# Patient Record
Sex: Female | Born: 1970
Health system: Southern US, Community
[De-identification: ages and names within clinical notes are randomized; demographics above are authoritative.]

## PROBLEM LIST (undated history)

## (undated) DIAGNOSIS — M5430 Sciatica, unspecified side: Secondary | ICD-10-CM

## (undated) DIAGNOSIS — E669 Obesity, unspecified: Secondary | ICD-10-CM

## (undated) DIAGNOSIS — F419 Anxiety disorder, unspecified: Secondary | ICD-10-CM

## (undated) DIAGNOSIS — F329 Major depressive disorder, single episode, unspecified: Secondary | ICD-10-CM

## (undated) DIAGNOSIS — M1990 Unspecified osteoarthritis, unspecified site: Secondary | ICD-10-CM

## (undated) DIAGNOSIS — I10 Essential (primary) hypertension: Secondary | ICD-10-CM

## (undated) DIAGNOSIS — Z973 Presence of spectacles and contact lenses: Secondary | ICD-10-CM

## (undated) DIAGNOSIS — F32A Depression, unspecified: Secondary | ICD-10-CM

## (undated) DIAGNOSIS — E785 Hyperlipidemia, unspecified: Secondary | ICD-10-CM

## (undated) DIAGNOSIS — D649 Anemia, unspecified: Secondary | ICD-10-CM

## (undated) DIAGNOSIS — G4730 Sleep apnea, unspecified: Secondary | ICD-10-CM

## (undated) DIAGNOSIS — O139 Gestational [pregnancy-induced] hypertension without significant proteinuria, unspecified trimester: Secondary | ICD-10-CM

## (undated) HISTORY — DX: Obesity, unspecified: E66.9

## (undated) HISTORY — PX: KNEE ARTHROSCOPY: SUR90

## (undated) HISTORY — DX: Depression, unspecified: F32.A

## (undated) HISTORY — DX: Anemia, unspecified: D64.9

## (undated) HISTORY — DX: Unspecified osteoarthritis, unspecified site: M19.90

## (undated) HISTORY — DX: Major depressive disorder, single episode, unspecified: F32.9

## (undated) HISTORY — DX: Essential (primary) hypertension: I10

## (undated) HISTORY — DX: Hyperlipidemia, unspecified: E78.5

---

## 2001-04-11 ENCOUNTER — Encounter: Payer: Self-pay | Admitting: Obstetrics and Gynecology

## 2001-04-11 ENCOUNTER — Ambulatory Visit (HOSPITAL_COMMUNITY): Admission: RE | Admit: 2001-04-11 | Discharge: 2001-04-11 | Payer: Self-pay | Admitting: Obstetrics and Gynecology

## 2001-05-17 ENCOUNTER — Ambulatory Visit (HOSPITAL_COMMUNITY): Admission: AD | Admit: 2001-05-17 | Discharge: 2001-05-17 | Payer: Self-pay | Admitting: Obstetrics and Gynecology

## 2001-07-12 ENCOUNTER — Ambulatory Visit (HOSPITAL_COMMUNITY): Admission: AD | Admit: 2001-07-12 | Discharge: 2001-07-12 | Payer: Self-pay | Admitting: Obstetrics and Gynecology

## 2001-07-14 ENCOUNTER — Ambulatory Visit (HOSPITAL_COMMUNITY): Admission: AD | Admit: 2001-07-14 | Discharge: 2001-07-14 | Payer: Self-pay | Admitting: Obstetrics and Gynecology

## 2001-07-19 ENCOUNTER — Ambulatory Visit (HOSPITAL_COMMUNITY): Admission: RE | Admit: 2001-07-19 | Discharge: 2001-07-19 | Payer: Self-pay | Admitting: Obstetrics and Gynecology

## 2001-07-21 ENCOUNTER — Ambulatory Visit (HOSPITAL_COMMUNITY): Admission: AD | Admit: 2001-07-21 | Discharge: 2001-07-21 | Payer: Self-pay | Admitting: *Deleted

## 2001-07-26 ENCOUNTER — Ambulatory Visit (HOSPITAL_COMMUNITY): Admission: AD | Admit: 2001-07-26 | Discharge: 2001-07-26 | Payer: Self-pay | Admitting: Obstetrics and Gynecology

## 2001-07-28 ENCOUNTER — Ambulatory Visit (HOSPITAL_COMMUNITY): Admission: RE | Admit: 2001-07-28 | Discharge: 2001-07-28 | Payer: Self-pay | Admitting: Obstetrics and Gynecology

## 2001-08-02 ENCOUNTER — Ambulatory Visit (HOSPITAL_COMMUNITY): Admission: RE | Admit: 2001-08-02 | Discharge: 2001-08-02 | Payer: Self-pay | Admitting: Obstetrics and Gynecology

## 2001-08-04 ENCOUNTER — Ambulatory Visit (HOSPITAL_COMMUNITY): Admission: AD | Admit: 2001-08-04 | Discharge: 2001-08-04 | Payer: Self-pay | Admitting: Obstetrics and Gynecology

## 2001-08-09 ENCOUNTER — Ambulatory Visit (HOSPITAL_COMMUNITY): Admission: AD | Admit: 2001-08-09 | Discharge: 2001-08-09 | Payer: Self-pay | Admitting: Obstetrics and Gynecology

## 2001-08-11 ENCOUNTER — Ambulatory Visit (HOSPITAL_COMMUNITY): Admission: AD | Admit: 2001-08-11 | Discharge: 2001-08-11 | Payer: Self-pay | Admitting: Obstetrics and Gynecology

## 2001-08-15 ENCOUNTER — Ambulatory Visit (HOSPITAL_COMMUNITY): Admission: RE | Admit: 2001-08-15 | Discharge: 2001-08-15 | Payer: Self-pay | Admitting: *Deleted

## 2001-08-18 ENCOUNTER — Inpatient Hospital Stay (HOSPITAL_COMMUNITY): Admission: RE | Admit: 2001-08-18 | Discharge: 2001-08-21 | Payer: Self-pay | Admitting: Obstetrics and Gynecology

## 2001-09-22 ENCOUNTER — Ambulatory Visit (HOSPITAL_COMMUNITY): Admission: RE | Admit: 2001-09-22 | Discharge: 2001-09-22 | Payer: Self-pay | Admitting: Obstetrics and Gynecology

## 2003-01-14 ENCOUNTER — Emergency Department (HOSPITAL_COMMUNITY): Admission: EM | Admit: 2003-01-14 | Discharge: 2003-01-14 | Payer: Self-pay | Admitting: *Deleted

## 2003-03-16 ENCOUNTER — Ambulatory Visit (HOSPITAL_COMMUNITY): Admission: RE | Admit: 2003-03-16 | Discharge: 2003-03-16 | Payer: Self-pay | Admitting: Family Medicine

## 2003-03-16 ENCOUNTER — Encounter: Payer: Self-pay | Admitting: Family Medicine

## 2004-05-11 ENCOUNTER — Emergency Department (HOSPITAL_COMMUNITY): Admission: EM | Admit: 2004-05-11 | Discharge: 2004-05-11 | Payer: Self-pay | Admitting: Emergency Medicine

## 2004-11-20 ENCOUNTER — Emergency Department (HOSPITAL_COMMUNITY): Admission: EM | Admit: 2004-11-20 | Discharge: 2004-11-21 | Payer: Self-pay | Admitting: Emergency Medicine

## 2004-12-01 ENCOUNTER — Ambulatory Visit (HOSPITAL_COMMUNITY): Admission: RE | Admit: 2004-12-01 | Discharge: 2004-12-01 | Payer: Self-pay | Admitting: *Deleted

## 2005-08-10 ENCOUNTER — Ambulatory Visit (HOSPITAL_BASED_OUTPATIENT_CLINIC_OR_DEPARTMENT_OTHER): Admission: RE | Admit: 2005-08-10 | Discharge: 2005-08-10 | Payer: Self-pay | Admitting: Family Medicine

## 2005-08-16 ENCOUNTER — Ambulatory Visit: Payer: Self-pay | Admitting: Internal Medicine

## 2007-10-06 ENCOUNTER — Other Ambulatory Visit: Admission: RE | Admit: 2007-10-06 | Discharge: 2007-10-06 | Payer: Self-pay | Admitting: Family Medicine

## 2008-04-21 ENCOUNTER — Ambulatory Visit: Payer: Self-pay | Admitting: Family Medicine

## 2008-04-28 ENCOUNTER — Emergency Department: Payer: Self-pay | Admitting: Emergency Medicine

## 2008-11-01 ENCOUNTER — Ambulatory Visit: Payer: Self-pay | Admitting: Family Medicine

## 2010-05-23 ENCOUNTER — Emergency Department: Payer: Self-pay | Admitting: Internal Medicine

## 2010-07-16 ENCOUNTER — Emergency Department (HOSPITAL_COMMUNITY): Admission: EM | Admit: 2010-07-16 | Discharge: 2010-07-16 | Payer: Self-pay | Admitting: Emergency Medicine

## 2010-08-12 ENCOUNTER — Ambulatory Visit: Payer: Self-pay | Admitting: Family Medicine

## 2010-09-02 ENCOUNTER — Ambulatory Visit (HOSPITAL_COMMUNITY): Admission: RE | Admit: 2010-09-02 | Discharge: 2010-09-02 | Payer: Self-pay | Admitting: Family Medicine

## 2011-02-23 ENCOUNTER — Ambulatory Visit: Payer: Self-pay | Admitting: Family Medicine

## 2011-03-14 ENCOUNTER — Ambulatory Visit: Payer: Self-pay | Admitting: Orthopedic Surgery

## 2011-03-14 LAB — POCT CARDIAC MARKERS
CKMB, poc: <1 ng/mL — ABNORMAL LOW (ref 1.0–8.0)
Myoglobin, poc: 34.6 ng/mL (ref 12–200)
Troponin i, poc: <0.05 ng/mL (ref 0.00–0.09)

## 2011-03-14 LAB — CBC
HCT: 39.3 % (ref 36.0–46.0)
Hemoglobin: 13.3 g/dL (ref 12.0–15.0)
MCH: 29.1 pg (ref 26.0–34.0)
MCHC: 33.8 g/dL (ref 30.0–36.0)
MCV: 86.1 fL (ref 78.0–100.0)
Platelets: 267 10*3/uL (ref 150–400)
RBC: 4.57 MIL/uL (ref 3.87–5.11)
RDW: 13.3 % (ref 11.5–15.5)
WBC: 8.7 10*3/uL (ref 4.0–10.5)

## 2011-03-14 LAB — DIFFERENTIAL
Basophils Absolute: 0 10*3/uL (ref 0.0–0.1)
Basophils Relative: 1 % (ref 0–1)
Eosinophils Absolute: 0.1 10*3/uL (ref 0.0–0.7)
Eosinophils Relative: 2 % (ref 0–5)
Lymphocytes Relative: 26 % (ref 12–46)
Lymphs Abs: 2.3 10*3/uL (ref 0.7–4.0)
Monocytes Absolute: 0.7 10*3/uL (ref 0.1–1.0)
Monocytes Relative: 8 % (ref 3–12)
Neutro Abs: 5.6 10*3/uL (ref 1.7–7.7)
Neutrophils Relative %: 64 % (ref 43–77)

## 2011-03-14 LAB — BASIC METABOLIC PANEL
BUN: 5 mg/dL — ABNORMAL LOW (ref 6–23)
CO2: 27 mEq/L (ref 19–32)
Calcium: 9.8 mg/dL (ref 8.4–10.5)
Chloride: 100 mEq/L (ref 96–112)
Creatinine, Ser: 0.51 mg/dL (ref 0.4–1.2)
GFR calc Af Amer: >60 mL/min (ref >60)
GFR calc non Af Amer: >60 mL/min (ref >60)
Glucose, Bld: 260 mg/dL — ABNORMAL HIGH (ref 70–99)
Potassium: 4.1 mEq/L (ref 3.5–5.1)
Sodium: 133 mEq/L — ABNORMAL LOW (ref 135–145)

## 2011-03-14 LAB — D-DIMER, QUANTITATIVE: D-Dimer, Quant: 0.22 ug/mL-FEU (ref 0.00–0.48)

## 2011-03-23 ENCOUNTER — Ambulatory Visit: Payer: Self-pay | Admitting: Anesthesiology

## 2011-03-26 ENCOUNTER — Ambulatory Visit: Payer: Self-pay | Admitting: Orthopedic Surgery

## 2011-04-22 ENCOUNTER — Ambulatory Visit (HOSPITAL_COMMUNITY)
Admission: RE | Admit: 2011-04-22 | Discharge: 2011-04-22 | Disposition: A | Discharge status: Home / Self Care | Payer: PRIVATE HEALTH INSURANCE | Source: Ambulatory Visit | Attending: Orthopedic Surgery | Admitting: Orthopedic Surgery

## 2011-04-22 DIAGNOSIS — IMO0001 Reserved for inherently not codable concepts without codable children: Secondary | ICD-10-CM | POA: Insufficient documentation

## 2011-04-22 DIAGNOSIS — M25569 Pain in unspecified knee: Secondary | ICD-10-CM | POA: Insufficient documentation

## 2011-04-22 DIAGNOSIS — M6281 Muscle weakness (generalized): Secondary | ICD-10-CM | POA: Insufficient documentation

## 2011-04-22 DIAGNOSIS — E119 Type 2 diabetes mellitus without complications: Secondary | ICD-10-CM | POA: Insufficient documentation

## 2011-04-22 DIAGNOSIS — M25669 Stiffness of unspecified knee, not elsewhere classified: Secondary | ICD-10-CM | POA: Insufficient documentation

## 2011-04-22 DIAGNOSIS — R262 Difficulty in walking, not elsewhere classified: Secondary | ICD-10-CM | POA: Insufficient documentation

## 2011-04-30 ENCOUNTER — Ambulatory Visit (HOSPITAL_COMMUNITY): Payer: PRIVATE HEALTH INSURANCE | Admitting: Physical Therapy

## 2011-05-01 ENCOUNTER — Ambulatory Visit (HOSPITAL_COMMUNITY): Payer: PRIVATE HEALTH INSURANCE

## 2011-05-15 NOTE — Procedures (Signed)
[name], Sabrina Roberts               ACCOUNT NO.:  [id number]   MEDICAL RECORD NO.:  [id number]          PATIENT TYPE:  OUT   LOCATION:  SLEEP CENTER                 FACILITY:  [facility]   PHYSICIAN:  Clinton D. Maple Hudson, M.D. DATE OF BIRTH:  [birth year]   DATE OF STUDY:  08/10/2005                              NOCTURNAL POLYSOMNOGRAM   REFERRING PHYSICIAN:  Dr. Renaye Rakers   DATE OF STUDY:  August 10, 2005   INDICATION FOR STUDY:  Insomnia with sleep apnea. Epworth Sleepiness Score  5/24, BMI 53, weight 311 pounds.   SLEEP ARCHITECTURE:  Total sleep time 294 minutes with sleep efficiency 67%.  Stage I was 8%, stage II 60%, stages III and IV 1%, REM 31% of total sleep  time. Sleep latency 71 minutes, REM latency 96 minutes, awake after sleep  onset 72 minutes, arousal index increased at 46. No bedtime medication  taken.   RESPIRATORY DATA:  Split study protocol. Apnea hypopnea index (RDI, AHI) 69  obstructive events per hour indicating severe obstructive sleep  apnea/hypopnea syndrome before CPAP. This included 20 obstructive apneas and  129 hypopneas before CPAP. Events were not positional. REM RDI of 25. CPAP  was titrated successfully to 10 CWP, RDI 0 per hour, using a medium  Respironics ComfortLite 2 nasal mask with heated humidifier.   OXYGEN DATA:  Moderate snoring with oxygen desaturation to a nadir of 79%  before CPAP. After CPAP control saturation held 94-98% on room air.   CARDIAC DATA:  Normal sinus rhythm.   MOVEMENT/PARASOMNIA:  A total of 176 limb jerks were counted of which 18  were associated with arousal or awakening for a periodic limb movement with  arousal index of 3.7 per hour which is mildly increased but of uncertain  significance in the context of sleep apnea arousals.   IMPRESSION/RECOMMENDATION:  1.  Severe obstructive sleep apnea/hypopnea syndrome, Respiratory      Disturbance Index 69 per hour with moderate snoring and oxygen      desaturation to 79%.  2.   Successful continuous positive airway pressure titration to 10 CWP,      apnea/hypopnea index 0 per hour using a medium Respironics ComfortLite 2      nasal mask with heated humidifier.      Clinton D. Maple Hudson, M.D.  Diplomate, Biomedical engineer of Sleep Medicine  Electronically Signed     CDY/MEDQ  D:  08/16/2005 12:39:37  T:  08/16/2005 19:27:36  Job:  [id number]

## 2011-06-12 ENCOUNTER — Ambulatory Visit: Payer: Self-pay | Admitting: Bariatrics

## 2011-06-15 ENCOUNTER — Ambulatory Visit: Payer: Self-pay | Admitting: Bariatrics

## 2011-06-19 ENCOUNTER — Ambulatory Visit: Payer: Self-pay | Admitting: Bariatrics

## 2011-06-28 ENCOUNTER — Ambulatory Visit: Payer: Self-pay | Admitting: Bariatrics

## 2011-07-29 ENCOUNTER — Ambulatory Visit: Payer: Self-pay | Admitting: Bariatrics

## 2011-08-07 ENCOUNTER — Telehealth: Payer: Self-pay | Admitting: Cardiovascular Disease

## 2011-08-07 NOTE — Telephone Encounter (Addendum)
LMOM for Pt to call Back, [employer] papers ready for Pick-Up.  08/07/11/KM  Pt Never called back, Called again this am Spoke with Husband Casimiro Needle let him know paperwork from [employer] forWife was completed and ready for pick-up, he ok'd and said he will let Wife know .   08/10/11/KM

## 2011-08-25 ENCOUNTER — Emergency Department (HOSPITAL_COMMUNITY): Payer: PRIVATE HEALTH INSURANCE

## 2011-08-25 ENCOUNTER — Encounter: Payer: Self-pay | Admitting: Emergency Medicine

## 2011-08-25 DIAGNOSIS — IMO0002 Reserved for concepts with insufficient information to code with codable children: Secondary | ICD-10-CM | POA: Insufficient documentation

## 2011-08-25 DIAGNOSIS — E119 Type 2 diabetes mellitus without complications: Secondary | ICD-10-CM | POA: Insufficient documentation

## 2011-08-25 NOTE — ED Notes (Signed)
Patient states family member broke glass on floor and states she feels like glass is in her right foot.

## 2011-08-26 ENCOUNTER — Emergency Department (HOSPITAL_COMMUNITY)
Admission: EM | Admit: 2011-08-26 | Discharge: 2011-08-26 | Disposition: A | Discharge status: Home / Self Care | Payer: PRIVATE HEALTH INSURANCE | Attending: Emergency Medicine | Admitting: Emergency Medicine

## 2011-08-26 DIAGNOSIS — IMO0002 Reserved for concepts with insufficient information to code with codable children: Secondary | ICD-10-CM

## 2011-08-26 MED ORDER — CEPHALEXIN 500 MG PO CAPS
ORAL_CAPSULE | ORAL | Status: AC
  Filled 2011-08-26: qty 1

## 2011-08-26 MED ORDER — LIDOCAINE-EPINEPHRINE (PF) 1 %-1:200000 IJ SOLN
INTRAMUSCULAR | Status: AC
  Filled 2011-08-26: qty 10

## 2011-08-26 MED ORDER — CEPHALEXIN 500 MG PO CAPS: 500.0000 mg | ORAL_CAPSULE | Freq: Four times a day (QID) | ORAL | Status: AC

## 2011-08-26 MED ORDER — CEPHALEXIN 500 MG PO CAPS
500.0000 mg | ORAL_CAPSULE | Freq: Once | ORAL | Status: AC
  Administered 2011-08-26: 500 mg via ORAL

## 2011-08-26 MED ORDER — TRAMADOL HCL 50 MG PO TABS: 50.0000 mg | ORAL_TABLET | Freq: Four times a day (QID) | ORAL | Status: AC | PRN

## 2011-08-26 NOTE — ED Provider Notes (Signed)
History     CSN: [id number] Arrival date & time: 08/26/2011 12:12 AM  Chief Complaint  Patient presents with  . Foreign Body   HPI Comments: Pt states her daughter dropped and broke a light bulb this AM.  She stepped on a sliver and it has bothered her all day.  Patient is a 40 y.o. female presenting with foreign body. The history is provided by the patient and the spouse. No language interpreter was used.  Foreign Body  The current episode started 6 to 12 hours ago. The incident was witnessed/reported by the patient. There were no sick contacts. She has received no recent medical care.    Past Medical History  Diagnosis Date  . Diabetes mellitus     Past Surgical History  Procedure Date  . Orthopedic surgery     No family history on file.  History  Substance Use Topics  . Smoking status: Never Smoker   . Smokeless tobacco: Not on file  . Alcohol Use: Yes    OB History    Grav Para Term Preterm Abortions TAB SAB Ect Mult Living                  Review of Systems  All other systems reviewed and are negative.    Physical Exam  BP 180/94  Pulse 104  Temp(Src) 98.2 F (36.8 C) (Oral)  Resp 18  Ht 5\' 4"  (1.626 m)  Wt 309 lb (140.161 kg)  BMI 53.04 kg/m2  SpO2 99%  LMP 05/25/2011  Physical Exam  Nursing note and vitals reviewed. Constitutional: She is oriented to person, place, and time. Vital signs are normal. She appears well-developed and well-nourished. No distress.  HENT:  Head: Normocephalic and atraumatic.  Right Ear: External ear normal.  Left Ear: External ear normal.  Nose: Nose normal.  Mouth/Throat: No oropharyngeal exudate.  Eyes: Conjunctivae and EOM are normal. Pupils are equal, round, and reactive to light. Right eye exhibits no discharge. Left eye exhibits no discharge. No scleral icterus.  Neck: Normal range of motion. Neck supple. No JVD present. No tracheal deviation present. No thyromegaly present.  Cardiovascular: Normal rate,  regular rhythm, normal heart sounds, intact distal pulses and normal pulses.  Exam reveals no gallop and no friction rub.   No murmur heard. Pulmonary/Chest: Effort normal and breath sounds normal. No stridor. No respiratory distress. She has no wheezes. She has no rales. She exhibits no tenderness.  Abdominal: Soft. Normal appearance and bowel sounds are normal. She exhibits no distension and no mass. There is no tenderness. There is no rebound and no guarding.  Musculoskeletal: Normal range of motion. She exhibits no edema and no tenderness.  Lymphadenopathy:    She has no cervical adenopathy.  Neurological: She is alert and oriented to person, place, and time. She has normal reflexes. No cranial nerve deficit. Coordination normal. GCS eye subscore is 4. GCS verbal subscore is 5. GCS motor subscore is 6.  Reflex Scores:      Tricep reflexes are 2+ on the right side and 2+ on the left side.      Bicep reflexes are 2+ on the right side and 2+ on the left side.      Brachioradialis reflexes are 2+ on the right side and 2+ on the left side.      Patellar reflexes are 2+ on the right side and 2+ on the left side.      Achilles reflexes are 2+ on the right side and  2+ on the left side. Skin: Skin is warm and dry. No rash noted. She is not diaphoretic.  Psychiatric: She has a normal mood and affect. Her speech is normal and behavior is normal. Judgment and thought content normal. Cognition and memory are normal.    ED Course  FOREIGN BODY REMOVAL Date/Time: 08/26/2011 1:00 AM Performed by: Worthy Rancher Authorized by: Worthy Rancher Consent: Verbal consent obtained. Written consent not obtained. Risks and benefits: risks, benefits and alternatives were discussed Consent given by: patient Patient understanding: patient states understanding of the procedure being performed Patient consent: the patient's understanding of the procedure matches consent given Imaging studies: imaging studies  available Patient identity confirmed: verbally with patient Time out: Immediately prior to procedure a "time out" was called to verify the correct patient, procedure, equipment, support staff and site/side marked as required. Body area: skin General location: lower extremity Location details: right foot Anesthesia: local infiltration Local anesthetic: lidocaine 1% with epinephrine Patient sedated: no Patient restrained: no Patient cooperative: yes Localization method: probed Tendon involvement: none Depth: subcutaneous Post-procedure assessment: foreign body not removed Patient tolerance: Patient tolerated the procedure well with no immediate complications.    MDM       Worthy Rancher, PA 08/26/11 0122  Worthy Rancher, PA 08/26/11 [phone]

## 2011-09-07 ENCOUNTER — Ambulatory Visit: Payer: Self-pay | Admitting: Bariatrics

## 2011-09-28 ENCOUNTER — Ambulatory Visit: Payer: Self-pay | Admitting: Bariatrics

## 2011-10-04 NOTE — ED Provider Notes (Signed)
Medical screening examination/treatment/procedure(s) were performed by non-physician practitioner and as supervising physician I was immediately available for consultation/collaboration.  Nicoletta Dress. Colon Branch, MD 10/04/11 2108

## 2011-10-22 ENCOUNTER — Ambulatory Visit: Payer: Self-pay | Admitting: Gastroenterology

## 2011-10-26 LAB — PATHOLOGY REPORT

## 2011-10-29 ENCOUNTER — Ambulatory Visit: Payer: Self-pay | Admitting: Bariatrics

## 2011-11-28 HISTORY — PX: GASTRIC BYPASS: SHX52

## 2011-11-28 HISTORY — PX: CHOLECYSTECTOMY: SHX55

## 2011-12-04 ENCOUNTER — Ambulatory Visit: Payer: Self-pay | Admitting: Bariatrics

## 2011-12-10 ENCOUNTER — Inpatient Hospital Stay: Payer: Self-pay | Admitting: Bariatrics

## 2011-12-14 LAB — PATHOLOGY REPORT

## 2012-02-26 ENCOUNTER — Encounter: Payer: Self-pay | Admitting: Family Medicine

## 2012-02-26 ENCOUNTER — Ambulatory Visit (INDEPENDENT_AMBULATORY_CARE_PROVIDER_SITE_OTHER): Payer: PRIVATE HEALTH INSURANCE | Admitting: Family Medicine

## 2012-02-26 VITALS — BP 142/82 | HR 76 | Resp 18 | Ht 64.5 in | Wt 264.0 lb

## 2012-02-26 DIAGNOSIS — E669 Obesity, unspecified: Secondary | ICD-10-CM

## 2012-02-26 DIAGNOSIS — E559 Vitamin D deficiency, unspecified: Secondary | ICD-10-CM

## 2012-02-26 DIAGNOSIS — E119 Type 2 diabetes mellitus without complications: Secondary | ICD-10-CM

## 2012-02-26 DIAGNOSIS — E041 Nontoxic single thyroid nodule: Secondary | ICD-10-CM

## 2012-02-26 DIAGNOSIS — E785 Hyperlipidemia, unspecified: Secondary | ICD-10-CM

## 2012-02-26 NOTE — Patient Instructions (Signed)
Please schedule a physical within the next 2-3 weeks Please set up for a Mammogram Get the blood work done fasting

## 2012-02-28 ENCOUNTER — Encounter: Payer: Self-pay | Admitting: Family Medicine

## 2012-02-28 DIAGNOSIS — E119 Type 2 diabetes mellitus without complications: Secondary | ICD-10-CM | POA: Insufficient documentation

## 2012-02-28 DIAGNOSIS — E041 Nontoxic single thyroid nodule: Secondary | ICD-10-CM | POA: Insufficient documentation

## 2012-02-28 DIAGNOSIS — E669 Obesity, unspecified: Secondary | ICD-10-CM | POA: Insufficient documentation

## 2012-02-28 DIAGNOSIS — E785 Hyperlipidemia, unspecified: Secondary | ICD-10-CM | POA: Insufficient documentation

## 2012-02-28 NOTE — Assessment & Plan Note (Signed)
·   Obtain FLP

## 2012-02-28 NOTE — Assessment & Plan Note (Signed)
Obtain A1C then determine need for medications

## 2012-02-28 NOTE — Progress Notes (Signed)
  Subjective:    Patient ID: Sabrina Roberts, female    DOB: January 05, 1971, 41 y.o.   MRN: [id number]  HPI Pt here to establish care, previous PCP in [place], [place] Works at [employer] in [employer], production Bypass- has lost 78 pounds since starting her weight loss journey. Followed by Dr. Alva Garnet.Currently on vitamins. Still unable to tolerate many foods without nausea.  DM- taken off all medications after her bypass, needs A1C and BMET  Hyperlipidemia- due for FLP  OA- in bilateral knees , left hip  Thyroid problem- has been followed for thyroid nodules, do for labwork, will obtain records regarding this.   Due for Mammogram, CPE, has had TDAP and Flu  Review of Systems  GEN- denies fatigue, fever, weight loss,weakness, recent illness HEENT- denies eye drainage, change in vision, nasal discharge, CVS- denies chest pain, palpitations RESP- denies SOB, cough, wheeze ABD- + N/V, change in stools, abd pain GU- denies dysuria, hematuria, dribbling, incontinence MSK- +joint pain, muscle aches, injury Neuro- denies headache, dizziness, syncope, seizure activity       Objective:   Physical Exam GEN- NAD, alert and oriented x3, obese, very pleasant HEENT- PERRL, EOMI, non injected sclera, pink conjunctiva, MMM, oropharynx clear Neck- Supple, no thryomegaly CVS- RRR, no murmur RESP-CTAB ABD-NABS, soft, NT, ND EXT- No edema Pulses- Radial, DP- 2+        Assessment & Plan:

## 2012-02-28 NOTE — Assessment & Plan Note (Signed)
Obtain records, check thyroid studies

## 2012-02-28 NOTE — Assessment & Plan Note (Signed)
S/p gastric bypass, doing very well. Will obtain records

## 2012-04-19 ENCOUNTER — Telehealth: Payer: Self-pay | Admitting: Family Medicine

## 2012-04-20 ENCOUNTER — Ambulatory Visit: Payer: Self-pay

## 2012-04-21 ENCOUNTER — Telehealth: Payer: Self-pay | Admitting: Family Medicine

## 2012-04-21 ENCOUNTER — Ambulatory Visit: Payer: Self-pay

## 2012-04-21 LAB — LIPID PANEL
Cholesterol: 144 mg/dL (ref 0–200)
Cholesterol: 144 mg/dL (ref 0–200)
HDL Cholesterol: 33 mg/dL — ABNORMAL LOW (ref 40–60)
HDL: 33 mg/dL — AB (ref 35–70)
LDL Cholesterol: 91 mg/dL
Ldl Cholesterol, Calc: 91 mg/dL (ref 0–100)
Triglycerides: 99 mg/dL (ref 0–200)
Triglycerides: 99 mg/dL (ref 40–160)
VLDL Cholesterol, Calc: 20 mg/dL (ref 5–40)

## 2012-04-21 LAB — COMPREHENSIVE METABOLIC PANEL
Albumin: 3.6 g/dL (ref 3.4–5.0)
Alkaline Phosphatase: 136 U/L (ref 50–136)
Anion Gap: 8 (ref 7–16)
BUN: 5 mg/dL — ABNORMAL LOW (ref 7–18)
Bilirubin,Total: 0.7 mg/dL (ref 0.2–1.0)
Calcium, Total: 8.9 mg/dL (ref 8.5–10.1)
Chloride: 102 mmol/L (ref 98–107)
Co2: 30 mmol/L (ref 21–32)
Creatinine: 0.58 mg/dL — ABNORMAL LOW (ref 0.60–1.30)
EGFR (African American): >60
EGFR (Non-African Amer.): >60
Glucose: 114 mg/dL — ABNORMAL HIGH (ref 65–99)
Osmolality: 278 (ref 275–301)
Potassium: 3 mmol/L — ABNORMAL LOW (ref 3.5–5.1)
SGOT(AST): 37 U/L (ref 15–37)
SGPT (ALT): 44 U/L
Sodium: 140 mmol/L (ref 136–145)
Total Protein: 7.5 g/dL (ref 6.4–8.2)

## 2012-04-21 LAB — HM MAMMOGRAPHY: HM Mammogram: NORMAL

## 2012-04-21 LAB — HEMOGLOBIN A1C
Hemoglobin A1C: 6.2 % (ref 4.2–6.3)
Hgb A1c MFr Bld: 6.2 % — AB (ref 4.0–6.0)

## 2012-04-21 LAB — BASIC METABOLIC PANEL
BUN: 5 mg/dL (ref 4–21)
Creatinine: 0.6 mg/dL (ref 0.5–1.1)
Glucose: 114 mg/dL
Potassium: 3 mmol/L — AB (ref 3.4–5.3)
Sodium: 140 mmol/L (ref 137–147)

## 2012-04-21 LAB — CBC WITH DIFFERENTIAL/PLATELET
Basophil #: 0.1 10*3/uL (ref 0.0–0.1)
Basophil %: 0.7 %
Eosinophil #: 0.2 10*3/uL (ref 0.0–0.7)
Eosinophil %: 2.5 %
HCT: 40.4 % (ref 35.0–47.0)
HGB: 13.5 g/dL (ref 12.0–16.0)
Lymphocyte #: 3.3 10*3/uL (ref 1.0–3.6)
Lymphocyte %: 44.3 %
MCH: 28.5 pg (ref 26.0–34.0)
MCHC: 33.5 g/dL (ref 32.0–36.0)
MCV: 85 fL (ref 80–100)
Monocyte #: 0.7 x10 3/mm (ref 0.2–0.9)
Monocyte %: 9.2 %
Neutrophil #: 3.2 10*3/uL (ref 1.4–6.5)
Neutrophil %: 43.3 %
Platelet: 246 10*3/uL (ref 150–440)
RBC: 4.74 10*6/uL (ref 3.80–5.20)
RDW: 13.8 % (ref 11.5–14.5)
WBC: 7.5 10*3/uL (ref 3.6–11.0)

## 2012-04-21 LAB — TSH
TSH: 0.43 u[IU]/mL (ref 0.41–5.90)
Thyroid Stimulating Horm: 0.427 u[IU]/mL — ABNORMAL LOW

## 2012-04-21 LAB — T4, FREE: Free Thyroxine: 1.08 ng/dL (ref 0.76–1.46)

## 2012-04-21 NOTE — Telephone Encounter (Signed)
Already faxed.

## 2012-04-21 NOTE — Telephone Encounter (Signed)
Will fax when signed

## 2012-04-25 ENCOUNTER — Ambulatory Visit: Payer: PRIVATE HEALTH INSURANCE | Admitting: Family Medicine

## 2012-04-25 ENCOUNTER — Encounter: Payer: Self-pay | Admitting: Family Medicine

## 2012-05-03 ENCOUNTER — Other Ambulatory Visit (HOSPITAL_COMMUNITY)
Admission: RE | Admit: 2012-05-03 | Discharge: 2012-05-03 | Disposition: A | Discharge status: Home / Self Care | Payer: PRIVATE HEALTH INSURANCE | Source: Ambulatory Visit | Attending: Family Medicine | Admitting: Family Medicine

## 2012-05-03 ENCOUNTER — Ambulatory Visit (INDEPENDENT_AMBULATORY_CARE_PROVIDER_SITE_OTHER): Payer: PRIVATE HEALTH INSURANCE | Admitting: Family Medicine

## 2012-05-03 ENCOUNTER — Encounter: Payer: Self-pay | Admitting: Family Medicine

## 2012-05-03 VITALS — BP 142/86 | HR 84 | Resp 18 | Ht 64.5 in | Wt 241.1 lb

## 2012-05-03 DIAGNOSIS — Z01419 Encounter for gynecological examination (general) (routine) without abnormal findings: Secondary | ICD-10-CM

## 2012-05-03 DIAGNOSIS — Z124 Encounter for screening for malignant neoplasm of cervix: Secondary | ICD-10-CM

## 2012-05-03 DIAGNOSIS — R21 Rash and other nonspecific skin eruption: Secondary | ICD-10-CM

## 2012-05-03 DIAGNOSIS — E119 Type 2 diabetes mellitus without complications: Secondary | ICD-10-CM

## 2012-05-03 DIAGNOSIS — M25519 Pain in unspecified shoulder: Secondary | ICD-10-CM

## 2012-05-03 DIAGNOSIS — Z129 Encounter for screening for malignant neoplasm, site unspecified: Secondary | ICD-10-CM

## 2012-05-03 DIAGNOSIS — E669 Obesity, unspecified: Secondary | ICD-10-CM

## 2012-05-03 DIAGNOSIS — E041 Nontoxic single thyroid nodule: Secondary | ICD-10-CM

## 2012-05-03 MED ORDER — MELOXICAM 7.5 MG PO TABS: 7.5000 mg | ORAL_TABLET | Freq: Every day | ORAL | Status: DC

## 2012-05-03 MED ORDER — CLOTRIMAZOLE-BETAMETHASONE 1-0.05 % EX CREA: TOPICAL_CREAM | CUTANEOUS | Status: DC

## 2012-05-03 NOTE — Assessment & Plan Note (Signed)
I need to review labs then will decide if meds needed

## 2012-05-03 NOTE — Assessment & Plan Note (Signed)
Pap Smear done Mammogram done  i will obtain records Labs to be reviewed Immunizations UTD

## 2012-05-03 NOTE — Progress Notes (Addendum)
  Subjective:    Patient ID: Sabrina Roberts, female    DOB: Sep 19, 1971, 41 y.o.   MRN: [id number]  HPI Patient here for GYN exam. She also had her mammogram done at [facility]. She's had blood work done however I do not have this in front of me will send off for this. Her only concern today is left shoulder pain which has been occurring on and off for the past 2 months. She remembers reaching up trying to fix her daughter's bunkbed when she felt a pull. She uses ibuprofen which helps some. Her pain is mostly at nighttime although she does not lay on that side. She also notices that she gets a rash beneath her breasts.  Cycles are irregular, LMP 2 months ago, they have been irregular and heavy lasting up to 9 days for the past 2 years   Review of Systems  GEN- denies fatigue, fever, weight loss,weakness, recent illness HEENT- denies eye drainage, change in vision, nasal discharge, CVS- denies chest pain, palpitations RESP- denies SOB, cough, wheeze ABD- denies N/V, change in stools, abd pain GU- denies dysuria, hematuria, dribbling, incontinence MSK- + joint pain, muscle aches, injury Neuro- denies headache, dizziness, syncope, seizure activity       Objective:   Physical Exam GEN- NAD, alert and oriented x3 HEENT- PERRL, EOMI, non injected sclera, pink conjunctiva, MMM, oropharynx clear Neck- Supple, no thyromegaly, ?small nodule Left upper pole CVS- RRR, no murmur RESP-CTAB Breast- normal symmetry, no nipple inversion,no nipple drainage, no nodules or lumps felt,skin- hyperpigemented macular rash beneath right breast, mild erythema Nodes- no axillary nodes GU- normal external genitalia, vaginal mucosa pink and moist, cervix visualized no growth, no blood form os, no discharge, no CMT, no ovarian masses, uterus normal size EXT- No edema Pulses- Radial, DP- 2+ MSK- Rotator cuff in tact bilat, no winged scapula, left shoulder TTP near [facility] joint, biceps intact, neg impingement  signs       Assessment & Plan:

## 2012-05-03 NOTE — Patient Instructions (Addendum)
We will get your Mammogram results I will call with your labs Get the ultrasound done for your thyroid For your shoulder taking the anti-inflammatory once a day for the next 4 weeks Cream for beneath the breast to be used twice a day as needed F/U 4 months

## 2012-05-03 NOTE — Assessment & Plan Note (Signed)
Treat for muscle strain possible tendinitis- Meloxicam x 4 weeks if no improvement refer to ortho

## 2012-05-03 NOTE — Assessment & Plan Note (Signed)
Given handwritten script for thyroid ultrasound to be done at [facility].

## 2012-05-03 NOTE — Assessment & Plan Note (Signed)
Weight down another 19lbs status post gastric bypass, she is doing well

## 2012-05-04 ENCOUNTER — Ambulatory Visit: Payer: Self-pay

## 2012-05-04 DIAGNOSIS — R21 Rash and other nonspecific skin eruption: Secondary | ICD-10-CM | POA: Insufficient documentation

## 2012-05-04 NOTE — Assessment & Plan Note (Signed)
likely secondary to moisture and yeast, will treat with course of lotrisone, she has tried some hydrocortisone for the same which was present beneath the left breast and this helped some

## 2012-05-13 ENCOUNTER — Telehealth: Payer: Self-pay | Admitting: Family Medicine

## 2012-05-13 NOTE — Telephone Encounter (Signed)
Called pt again LVM

## 2012-05-13 NOTE — Telephone Encounter (Addendum)
LVM for pt to return call A1C is 6.2%, she can stay off medications and we will recheck at next visit Her thyroid studies are overall normal, Her TSH was slightly low, so I will look at this in setting of her thyroid study and recheck her thyroid studies with her repeat labs  Her potassium was low at 3.0, I want her to increase her potassium intake, she can try half a banana, green leafy veggies, avocado, beans etc. Potassium script given for once a day supplement Repeat labs in 3 months

## 2012-05-16 NOTE — Telephone Encounter (Signed)
Patient aware. RX faxed

## 2012-05-16 NOTE — Telephone Encounter (Signed)
rx faxed.  Pt aware 

## 2012-07-05 ENCOUNTER — Telehealth: Payer: Self-pay | Admitting: Family Medicine

## 2012-07-06 NOTE — Telephone Encounter (Signed)
Please call and see what this is in reference to?  

## 2012-07-07 ENCOUNTER — Other Ambulatory Visit: Payer: Self-pay

## 2012-07-07 ENCOUNTER — Telehealth: Payer: Self-pay | Admitting: Family Medicine

## 2012-07-07 MED ORDER — ZOLPIDEM TARTRATE 10 MG PO TABS: 10.0000 mg | ORAL_TABLET | Freq: Every evening | ORAL | Status: DC | PRN

## 2012-07-07 NOTE — Telephone Encounter (Signed)
Wants to know if you can prescribe something to help her sleep.  [facility]

## 2012-07-07 NOTE — Telephone Encounter (Signed)
Trial of ambien 

## 2012-10-13 ENCOUNTER — Encounter: Payer: Self-pay | Admitting: Family Medicine

## 2012-10-13 ENCOUNTER — Ambulatory Visit (INDEPENDENT_AMBULATORY_CARE_PROVIDER_SITE_OTHER): Payer: PRIVATE HEALTH INSURANCE | Admitting: Family Medicine

## 2012-10-13 VITALS — BP 120/90 | HR 66 | Wt 214.1 lb

## 2012-10-13 DIAGNOSIS — E669 Obesity, unspecified: Secondary | ICD-10-CM

## 2012-10-13 DIAGNOSIS — F430 Acute stress reaction: Secondary | ICD-10-CM

## 2012-10-13 DIAGNOSIS — G4700 Insomnia, unspecified: Secondary | ICD-10-CM

## 2012-10-13 DIAGNOSIS — E119 Type 2 diabetes mellitus without complications: Secondary | ICD-10-CM

## 2012-10-13 DIAGNOSIS — E785 Hyperlipidemia, unspecified: Secondary | ICD-10-CM

## 2012-10-13 DIAGNOSIS — E041 Nontoxic single thyroid nodule: Secondary | ICD-10-CM

## 2012-10-13 MED ORDER — ZOLPIDEM TARTRATE 10 MG PO TABS: 10.0000 mg | ORAL_TABLET | Freq: Every evening | ORAL | Status: DC | PRN

## 2012-10-13 NOTE — Progress Notes (Signed)
  Subjective:    Patient ID: Sabrina Roberts, female    DOB: [birth year], 41 y.o.   MRN: [id number]  HPI Pt here to f/u chronic medical problems. She's been off her diabetes medicines for the past 5 months. She's been doing well. Her blood sugars today around the 100. She is status post gastric bypass surgery. She's lost over 100 pounds. She has had a lot of stress in her life with the change in job and also caring for a sickly mother-in-law. She is in a counselor through her work and feels this is going well. She was started on Ambien about 2 months ago to help with sleep which is been working for other she does not need to take this every night. Initially she thought she may need an antidepressant at this time would like to hold off as she is may progress with her therapist.   Review of Systems  GEN- denies fatigue, fever, weight loss,weakness, recent illness HEENT- denies eye drainage, change in vision, nasal discharge, CVS- denies chest pain, palpitations RESP- denies SOB, cough, wheeze ABD- denies N/V, change in stools, abd pain GU- denies dysuria, hematuria, dribbling, incontinence MSK- denies joint pain, muscle aches, injury Neuro- denies headache, dizziness, syncope, seizure activity      Objective:   Physical Exam GEN- NAD, alert and oriented x3 HEENT- PERRL, EOMI, non injected sclera, pink conjunctiva, MMM, oropharynx clear Neck- Supple, no thryomegaly CVS- RRR, no murmur RESP-CTAB EXT- No edema Pulses- Radial, DP- 2+ Psych-normal affect and Mood. No SI       Assessment & Plan:

## 2012-10-13 NOTE — Patient Instructions (Addendum)
Get blood work done fasting Continue your vitamins Call if meds needed for mood F/U 6 months

## 2012-10-14 ENCOUNTER — Ambulatory Visit: Payer: Self-pay

## 2012-10-14 ENCOUNTER — Encounter: Payer: Self-pay | Admitting: Family Medicine

## 2012-10-14 DIAGNOSIS — F430 Acute stress reaction: Secondary | ICD-10-CM | POA: Insufficient documentation

## 2012-10-14 DIAGNOSIS — G4700 Insomnia, unspecified: Secondary | ICD-10-CM | POA: Insufficient documentation

## 2012-10-14 LAB — COMPREHENSIVE METABOLIC PANEL
Albumin: 3.7 g/dL (ref 3.4–5.0)
Alkaline Phosphatase: 129 U/L (ref 50–136)
Anion Gap: 4 — ABNORMAL LOW (ref 7–16)
BUN: 9 mg/dL (ref 7–18)
Bilirubin,Total: 0.7 mg/dL (ref 0.2–1.0)
Calcium, Total: 9 mg/dL (ref 8.5–10.1)
Chloride: 105 mmol/L (ref 98–107)
Co2: 30 mmol/L (ref 21–32)
Creatinine: 0.51 mg/dL — ABNORMAL LOW (ref 0.60–1.30)
EGFR (African American): >60
EGFR (Non-African Amer.): >60
Glucose: 92 mg/dL (ref 65–99)
Osmolality: 276 (ref 275–301)
Potassium: 3.7 mmol/L (ref 3.5–5.1)
SGOT(AST): 19 U/L (ref 15–37)
SGPT (ALT): 22 U/L (ref 12–78)
Sodium: 139 mmol/L (ref 136–145)
Total Protein: 7.5 g/dL (ref 6.4–8.2)

## 2012-10-14 LAB — CBC WITH DIFFERENTIAL/PLATELET
Basophil #: 0 10*3/uL (ref 0.0–0.1)
Basophil %: 0.9 %
Eosinophil #: 0.2 10*3/uL (ref 0.0–0.7)
Eosinophil %: 3.6 %
HCT: 39 % (ref 35.0–47.0)
HGB: 13.2 g/dL (ref 12.0–16.0)
Lymphocyte #: 2.4 10*3/uL (ref 1.0–3.6)
Lymphocyte %: 44.7 %
MCH: 29.8 pg (ref 26.0–34.0)
MCHC: 33.8 g/dL (ref 32.0–36.0)
MCV: 88 fL (ref 80–100)
Monocyte #: 0.4 x10 3/mm (ref 0.2–0.9)
Monocyte %: 7.2 %
Neutrophil #: 2.4 10*3/uL (ref 1.4–6.5)
Neutrophil %: 43.6 %
Platelet: 246 10*3/uL (ref 150–440)
RBC: 4.42 10*6/uL (ref 3.80–5.20)
RDW: 13.7 % (ref 11.5–14.5)
WBC: 5.4 10*3/uL (ref 3.6–11.0)

## 2012-10-14 LAB — HEMOGLOBIN A1C: Hemoglobin A1C: 5.9 % (ref 4.2–6.3)

## 2012-10-14 LAB — LIPID PANEL
Cholesterol: 163 mg/dL (ref 0–200)
HDL Cholesterol: 54 mg/dL (ref 40–60)
Ldl Cholesterol, Calc: 94 mg/dL (ref 0–100)
Triglycerides: 77 mg/dL (ref 0–200)
VLDL Cholesterol, Calc: 15 mg/dL (ref 5–40)

## 2012-10-14 LAB — TSH: Thyroid Stimulating Horm: 0.457 u[IU]/mL

## 2012-10-14 NOTE — Assessment & Plan Note (Signed)
Check A1C off medications, previous 6.2%, with weight loss, she may not need further medications, she can check CBG q weekly for now or if feeling sick New meter called in by nurse

## 2012-10-14 NOTE — Assessment & Plan Note (Signed)
Check TFT,non specific tiny nodules in 2011

## 2012-10-14 NOTE — Assessment & Plan Note (Signed)
No meds at this time, if this change will start SSRI, continue with therapist

## 2012-10-14 NOTE — Assessment & Plan Note (Signed)
LDL at goal 

## 2012-10-14 NOTE — Assessment & Plan Note (Signed)
Initial weight 344lbs prior to surgery, down 130lbs

## 2012-10-14 NOTE — Assessment & Plan Note (Signed)
Continue prn ambien  

## 2012-10-17 ENCOUNTER — Telehealth: Payer: Self-pay | Admitting: Family Medicine

## 2012-10-17 NOTE — Telephone Encounter (Signed)
Stamped and faxed order to number provided

## 2012-10-28 ENCOUNTER — Telehealth: Payer: Self-pay | Admitting: Family Medicine

## 2012-10-28 NOTE — Telephone Encounter (Signed)
Have not received labs. Did not do at [facility]

## 2012-11-01 NOTE — Telephone Encounter (Signed)
Have you received any labwork that was faxed over for this pt

## 2012-11-01 NOTE — Telephone Encounter (Signed)
I dont have any results, see if lab can fax again

## 2012-11-21 ENCOUNTER — Ambulatory Visit: Payer: Self-pay

## 2012-11-21 LAB — RAPID STREP-A WITH REFLX: Micro Text Report: NEGATIVE

## 2012-11-23 LAB — BETA STREP CULTURE(ARMC)

## 2013-01-20 ENCOUNTER — Telehealth: Payer: Self-pay | Admitting: Family Medicine

## 2013-01-20 MED ORDER — ZOLPIDEM TARTRATE 10 MG PO TABS: 10.0000 mg | ORAL_TABLET | Freq: Every evening | ORAL | Status: DC | PRN

## 2013-01-20 NOTE — Telephone Encounter (Signed)
Med sent.

## 2013-04-13 ENCOUNTER — Ambulatory Visit: Payer: PRIVATE HEALTH INSURANCE | Admitting: Family Medicine

## 2013-04-30 ENCOUNTER — Other Ambulatory Visit: Payer: Self-pay | Admitting: Family Medicine

## 2013-05-08 ENCOUNTER — Other Ambulatory Visit: Payer: Self-pay | Admitting: Family Medicine

## 2013-05-15 ENCOUNTER — Telehealth: Payer: Self-pay | Admitting: Family Medicine

## 2013-05-15 ENCOUNTER — Other Ambulatory Visit: Payer: Self-pay

## 2013-05-15 MED ORDER — ZOLPIDEM TARTRATE 10 MG PO TABS: ORAL_TABLET | ORAL | Status: DC

## 2013-05-15 NOTE — Telephone Encounter (Signed)
Med refilled and to be faxed to pharmacy this am.

## 2013-08-01 ENCOUNTER — Ambulatory Visit: Payer: Self-pay

## 2013-08-01 ENCOUNTER — Encounter: Payer: Self-pay | Admitting: Family Medicine

## 2013-08-01 ENCOUNTER — Ambulatory Visit (INDEPENDENT_AMBULATORY_CARE_PROVIDER_SITE_OTHER): Payer: 59 | Admitting: Family Medicine

## 2013-08-01 VITALS — BP 130/80 | HR 82 | Temp 97.5°F | Resp 18 | Wt 210.0 lb

## 2013-08-01 DIAGNOSIS — E785 Hyperlipidemia, unspecified: Secondary | ICD-10-CM

## 2013-08-01 DIAGNOSIS — G4700 Insomnia, unspecified: Secondary | ICD-10-CM

## 2013-08-01 DIAGNOSIS — N926 Irregular menstruation, unspecified: Secondary | ICD-10-CM

## 2013-08-01 DIAGNOSIS — E119 Type 2 diabetes mellitus without complications: Secondary | ICD-10-CM

## 2013-08-01 DIAGNOSIS — M25579 Pain in unspecified ankle and joints of unspecified foot: Secondary | ICD-10-CM | POA: Insufficient documentation

## 2013-08-01 MED ORDER — ZOLPIDEM TARTRATE 10 MG PO TABS: ORAL_TABLET | ORAL | Status: DC

## 2013-08-01 NOTE — Assessment & Plan Note (Addendum)
Diet controlled we'll check A1c Urine micro needed next visit Given hand written prescription for her labs including CMET, CBC, A1c, fasting lipid panel

## 2013-08-01 NOTE — Progress Notes (Signed)
  Subjective:    Patient ID: Sabrina Roberts, female    DOB: 11-25-1971, 42 y.o.   MRN: [id number]  HPI Patient to follow chronic medical problems. She's concerned about irregular menses. She has a long-term history of irregular cycles however this year his only had 2 periods. Both of them have been heavier and lasted about 7-8 days. She thinks she is about to start currently. Her last Pap smear was May of last year which was normal. She's also noticed some hot flashes where her hair feels drenched as well as some chills on and off. She's not had any mood swings. She believes her mother started menopause around her 51s.  Bilateral foot pain over the past few months she has had pain on both sides near her pinky toe. She feels a bony part sticking out. It's painful when she walks. She denies any particular injury. She's been wearing wide shoes as she knows she is flat-footed.  Due for fasting lab  Insomnia-doing well on a me and he does not use on the weekends or if she gets home late typically takes a few times a week as needed   Review of Systems   GEN- denies fatigue, fever, weight loss,weakness, recent illness HEENT- denies eye drainage, change in vision, nasal discharge, CVS- denies chest pain, palpitations RESP- denies SOB, cough, wheeze ABD- denies N/V, change in stools, abd pain GU- denies dysuria, hematuria, dribbling, incontinence MSK- + joint pain, muscle aches, injury Neuro- denies headache, dizziness, syncope, seizure activity      Objective:   Physical Exam GEN- NAD, alert and oriented x3, obese HEENT- PERRL, EOMI, non injected sclera, pink conjunctiva, MMM, oropharynx clear CVS- RRR, no murmur RESP-CTAB ABD-NABS,soft,NT,ND EXT- No edema, Foot- TTp along lateral aspect of 5th digit, small bony protuberance bilaterally, no erythema , mild callus formation bilaterally same spot, flat footed, with widened foot  Pulses- Radial, DP- 2+        Assessment & Plan:

## 2013-08-01 NOTE — Assessment & Plan Note (Signed)
She has some bilateral foot pain along the lateral aspect of her foot there bony protuberances on both sides which I suspect may be bone spurs. She also is very flat-footed and has lost her metatarsal arches. I will obtain x-ray of bilateral foot and we will go from there. She may need orthotics or podiatry referral

## 2013-08-01 NOTE — Assessment & Plan Note (Signed)
Check fasting lipid panel no medications

## 2013-08-01 NOTE — Patient Instructions (Addendum)
We will call with lab results  Get the xray done of the foot  Schedule GYN exam

## 2013-08-01 NOTE — Assessment & Plan Note (Signed)
Well-controlled with use of Ambien as needed

## 2013-08-01 NOTE — Assessment & Plan Note (Signed)
I will obtain hormone studies. LH, FSH, TSH she will return for Pap smear

## 2013-08-23 ENCOUNTER — Other Ambulatory Visit: Payer: 59 | Admitting: Family Medicine

## 2013-11-02 ENCOUNTER — Other Ambulatory Visit: Payer: Self-pay

## 2014-01-15 ENCOUNTER — Other Ambulatory Visit: Payer: Self-pay | Admitting: Family Medicine

## 2014-01-15 ENCOUNTER — Ambulatory Visit: Payer: Self-pay

## 2014-01-15 DIAGNOSIS — Z0000 Encounter for general adult medical examination without abnormal findings: Secondary | ICD-10-CM

## 2014-01-15 LAB — CBC WITH DIFFERENTIAL/PLATELET
Basophil #: 0 10*3/uL (ref 0.0–0.1)
Basophil %: 0.7 %
Eosinophil #: 0.3 10*3/uL (ref 0.0–0.7)
Eosinophil %: 5.4 %
HCT: 40.1 % (ref 35.0–47.0)
HGB: 13.3 g/dL (ref 12.0–16.0)
Lymphocyte #: 1.7 10*3/uL (ref 1.0–3.6)
Lymphocyte %: 32.3 %
MCH: 29.5 pg (ref 26.0–34.0)
MCHC: 33.3 g/dL (ref 32.0–36.0)
MCV: 89 fL (ref 80–100)
Monocyte #: 0.4 x10 3/mm (ref 0.2–0.9)
Monocyte %: 8.2 %
Neutrophil #: 2.9 10*3/uL (ref 1.4–6.5)
Neutrophil %: 53.4 %
Platelet: 278 10*3/uL (ref 150–440)
RBC: 4.52 10*6/uL (ref 3.80–5.20)
RDW: 13.6 % (ref 11.5–14.5)
WBC: 5.4 10*3/uL (ref 3.6–11.0)

## 2014-01-15 LAB — LIPID PANEL
Cholesterol: 187 mg/dL (ref 0–200)
HDL Cholesterol: 77 mg/dL — ABNORMAL HIGH (ref 40–60)
Ldl Cholesterol, Calc: 91 mg/dL (ref 0–100)
Triglycerides: 93 mg/dL (ref 0–200)
VLDL Cholesterol, Calc: 19 mg/dL (ref 5–40)

## 2014-01-15 LAB — COMPREHENSIVE METABOLIC PANEL
Albumin: 3.8 g/dL (ref 3.4–5.0)
Alkaline Phosphatase: 133 U/L — ABNORMAL HIGH
Anion Gap: 11 (ref 7–16)
BUN: 8 mg/dL (ref 7–18)
Bilirubin,Total: 0.6 mg/dL (ref 0.2–1.0)
Calcium, Total: 9 mg/dL (ref 8.5–10.1)
Chloride: 102 mmol/L (ref 98–107)
Co2: 27 mmol/L (ref 21–32)
Creatinine: 0.54 mg/dL — ABNORMAL LOW (ref 0.60–1.30)
EGFR (African American): >60
EGFR (Non-African Amer.): >60
Glucose: 89 mg/dL (ref 65–99)
Osmolality: 277 (ref 275–301)
Potassium: 3.9 mmol/L (ref 3.5–5.1)
SGOT(AST): 14 U/L — ABNORMAL LOW (ref 15–37)
SGPT (ALT): 24 U/L (ref 12–78)
Sodium: 140 mmol/L (ref 136–145)
Total Protein: 7.6 g/dL (ref 6.4–8.2)

## 2014-01-15 LAB — TSH: Thyroid Stimulating Horm: 0.339 u[IU]/mL — ABNORMAL LOW

## 2014-01-16 ENCOUNTER — Encounter: Payer: 59 | Admitting: Family Medicine

## 2014-01-23 ENCOUNTER — Telehealth: Payer: Self-pay | Admitting: Family Medicine

## 2014-01-23 NOTE — Telephone Encounter (Signed)
Okay to refill, needs OV  

## 2014-01-23 NOTE — Telephone Encounter (Signed)
?   Ok to refill; last ov 08/01/2013; last refill 10/01/13

## 2014-01-23 NOTE — Telephone Encounter (Signed)
Pt is needing a refill on her Ambien Call back number is [phone] Pharmacy is [facility] in [place]

## 2014-01-24 ENCOUNTER — Encounter: Payer: 59 | Admitting: Family Medicine

## 2014-01-24 MED ORDER — ZOLPIDEM TARTRATE 10 MG PO TABS: ORAL_TABLET | ORAL | Status: DC

## 2014-01-24 NOTE — Telephone Encounter (Signed)
Med called in to pts pharmacy, pt is aware needs follow up appt before further refills

## 2014-04-05 ENCOUNTER — Other Ambulatory Visit: Payer: Self-pay

## 2014-07-04 ENCOUNTER — Telehealth: Payer: Self-pay | Admitting: *Deleted

## 2014-07-04 ENCOUNTER — Other Ambulatory Visit: Payer: Self-pay | Admitting: *Deleted

## 2014-07-04 DIAGNOSIS — E119 Type 2 diabetes mellitus without complications: Secondary | ICD-10-CM

## 2014-07-04 NOTE — Telephone Encounter (Signed)
Message copied by Sheral Flow on Wed Jul 04, 2014  9:24 AM ------      Message from: Lenore Manner      Created: Tue Jul 03, 2014  3:05 PM      Regarding: one of the pts thats on my list      Contact: [phone]       PT states she can get the labs done at her job, she has a CPE coming up and she does need LDL an A1c             She said you can fax the orders to [phone] ------

## 2014-07-04 NOTE — Telephone Encounter (Signed)
Orders for CPE faxed to patient work.

## 2014-07-25 ENCOUNTER — Encounter: Payer: 59 | Admitting: Family Medicine

## 2014-07-31 ENCOUNTER — Encounter: Payer: 59 | Admitting: Family Medicine

## 2014-08-09 ENCOUNTER — Encounter: Payer: 59 | Admitting: Family Medicine

## 2014-09-24 ENCOUNTER — Encounter: Payer: 59 | Admitting: Family Medicine

## 2014-10-02 ENCOUNTER — Encounter: Payer: 59 | Admitting: Family Medicine

## 2014-10-25 ENCOUNTER — Ambulatory Visit: Payer: Self-pay | Admitting: Family Medicine

## 2014-10-25 ENCOUNTER — Telehealth: Payer: Self-pay | Admitting: Family Medicine

## 2014-10-25 LAB — TSH
TSH: 0.48 u[IU]/mL (ref 0.41–5.90)
Thyroid Stimulating Horm: 0.488 u[IU]/mL

## 2014-10-25 LAB — COMPREHENSIVE METABOLIC PANEL
Albumin: 3.5 g/dL (ref 3.4–5.0)
Alkaline Phosphatase: 130 U/L — ABNORMAL HIGH
Anion Gap: 8 (ref 7–16)
BUN: 7 mg/dL (ref 7–18)
Bilirubin,Total: 0.6 mg/dL (ref 0.2–1.0)
Calcium, Total: 8.7 mg/dL (ref 8.5–10.1)
Chloride: 106 mmol/L (ref 98–107)
Co2: 28 mmol/L (ref 21–32)
Creatinine: 0.55 mg/dL — ABNORMAL LOW (ref 0.60–1.30)
EGFR (African American): >60
EGFR (Non-African Amer.): >60
Glucose: 96 mg/dL (ref 65–99)
Osmolality: 281 (ref 275–301)
Potassium: 3.9 mmol/L (ref 3.5–5.1)
SGOT(AST): 19 U/L (ref 15–37)
SGPT (ALT): 20 U/L
Sodium: 142 mmol/L (ref 136–145)
Total Protein: 7.2 g/dL (ref 6.4–8.2)

## 2014-10-25 LAB — LIPID PANEL
Cholesterol: 172 mg/dL (ref 0–200)
Cholesterol: 172 mg/dL (ref 0–200)
HDL Cholesterol: 56 mg/dL (ref 40–60)
HDL: 56 mg/dL (ref 35–70)
LDL Cholesterol: 100 mg/dL
Ldl Cholesterol, Calc: 100 mg/dL (ref 0–100)
Triglycerides: 82 mg/dL (ref 0–200)
Triglycerides: 82 mg/dL (ref 40–160)
VLDL Cholesterol, Calc: 16 mg/dL (ref 5–40)

## 2014-10-25 LAB — CBC WITH DIFFERENTIAL/PLATELET
Basophil #: 0.1 10*3/uL (ref 0.0–0.1)
Basophil %: 0.8 %
Eosinophil #: 0.2 10*3/uL (ref 0.0–0.7)
Eosinophil %: 2.6 %
HCT: 38.7 % (ref 35.0–47.0)
HGB: 12.8 g/dL (ref 12.0–16.0)
Lymphocyte #: 2.9 10*3/uL (ref 1.0–3.6)
Lymphocyte %: 43.4 %
MCH: 30.1 pg (ref 26.0–34.0)
MCHC: 33.2 g/dL (ref 32.0–36.0)
MCV: 91 fL (ref 80–100)
Monocyte #: 0.7 x10 3/mm (ref 0.2–0.9)
Monocyte %: 10.6 %
Neutrophil #: 2.8 10*3/uL (ref 1.4–6.5)
Neutrophil %: 42.6 %
Platelet: 270 10*3/uL (ref 150–440)
RBC: 4.27 10*6/uL (ref 3.80–5.20)
RDW: 13.3 % (ref 11.5–14.5)
WBC: 6.6 10*3/uL (ref 3.6–11.0)

## 2014-10-25 LAB — HEPATIC FUNCTION PANEL
ALT: 19 U/L (ref 7–35)
AST: 20 U/L (ref 13–35)

## 2014-10-25 LAB — BASIC METABOLIC PANEL
BUN: 7 mg/dL (ref 4–21)
Creatinine: 0.6 mg/dL (ref 0.5–1.1)
Glucose: 96 mg/dL

## 2014-10-25 LAB — HEMOGLOBIN A1C
Hemoglobin A1C: 6 % (ref 4.2–6.3)
Hgb A1c MFr Bld: 6 % (ref 4.0–6.0)

## 2014-10-25 LAB — CBC AND DIFFERENTIAL
HCT: 39 % (ref 36–46)
Hemoglobin: 12.8 g/dL (ref 12.0–16.0)
WBC: 6.6 10^3/mL

## 2014-10-25 NOTE — Telephone Encounter (Signed)
Orders for labs were faxed 06/2014.  Call placed to patient to inquire.   Reports that orders were not signed by MD. Durward Fortes that the orders sent were lab requisitions and have electronic signature.   Requested orders to be re-faxed.   Sent lab orders again.

## 2014-10-25 NOTE — Telephone Encounter (Signed)
[phone]  Pt has an upcoming apt and is needing orders for her labs sent to [phone] attn: Sherilyn Dacosta

## 2014-10-29 ENCOUNTER — Ambulatory Visit (INDEPENDENT_AMBULATORY_CARE_PROVIDER_SITE_OTHER): Payer: 59 | Admitting: Family Medicine

## 2014-10-29 ENCOUNTER — Encounter: Payer: Self-pay | Admitting: Family Medicine

## 2014-10-29 VITALS — BP 130/76 | HR 82 | Temp 99.2°F | Resp 14 | Ht 61.0 in | Wt 243.0 lb

## 2014-10-29 DIAGNOSIS — Z124 Encounter for screening for malignant neoplasm of cervix: Secondary | ICD-10-CM

## 2014-10-29 DIAGNOSIS — Z23 Encounter for immunization: Secondary | ICD-10-CM

## 2014-10-29 DIAGNOSIS — Z0000 Encounter for general adult medical examination without abnormal findings: Secondary | ICD-10-CM

## 2014-10-29 DIAGNOSIS — R946 Abnormal results of thyroid function studies: Secondary | ICD-10-CM

## 2014-10-29 DIAGNOSIS — E119 Type 2 diabetes mellitus without complications: Secondary | ICD-10-CM

## 2014-10-29 MED ORDER — VITAMIN D (ERGOCALCIFEROL) 1.25 MG (50000 UNIT) PO CAPS: 50000.0000 [IU] | ORAL_CAPSULE | ORAL | Status: DC

## 2014-10-29 MED ORDER — ZOLPIDEM TARTRATE 10 MG PO TABS: ORAL_TABLET | ORAL | Status: DC

## 2014-10-29 NOTE — Assessment & Plan Note (Signed)
Diet controlled Plan for urine microalbumin

## 2014-10-29 NOTE — Patient Instructions (Addendum)
I recommend eye visit once a year I recommend dental visit every 6 months Goal is to  Exercise 30 minutes 5 days a week Get the thyroid labs done Schedule your mammogram Vitamin D for next 6 months Pneumonia vaccine 23 given F/U 6 months

## 2014-10-29 NOTE — Progress Notes (Signed)
Patient ID: Sabrina Roberts, female   DOB: [birth year], 43 y.o.   MRN: [id number]   Subjective:    Patient ID: Sabrina Roberts, female    DOB: 1971-01-05, 43 y.o.   MRN: [id number]  Patient presents for CPE  No specific concerns, last PAP Smear in 2013, was normal. Due for mammogram History of abnormal PAP in the past. LMP started today, previous cycle 4 months ago, has irregular menses Fasting labs reviewed- Vitamin D very low at 6  Diabetes mellitus diet controlled since gastric bypass, A1C 6%, due for pneumonia vaccine Obesity- has gained some weight back, not watching diet, started with a traniner as part of a wellness program with her work    Review Of Systems:  GEN- denies fatigue, fever, weight loss,weakness, recent illness HEENT- denies eye drainage, change in vision, nasal discharge, CVS- denies chest pain, palpitations RESP- denies SOB, cough, wheeze ABD- denies N/V, change in stools, abd pain GU- denies dysuria, hematuria, dribbling, incontinence MSK- denies joint pain, muscle aches, injury Neuro- denies headache, dizziness, syncope, seizure activity       Objective:    BP 130/76 mmHg  Pulse 82  Temp(Src) 99.2 F (37.3 C) (Oral)  Resp 14  Ht 5\' 1"  (1.549 m)  Wt 243 lb (110.224 kg)  BMI 45.94 kg/m2  LMP 10/28/2014 (Exact Date) GEN- NAD, alert and oriented x3,  +33lb weight gain HEENT- PERRL, EOMI, non injected sclera, pink conjunctiva, MMM, oropharynx clear Neck- Supple, no thyromegaly CVS- RRR, no murmur Breast- normal symmetry, no nipple inversion,no nipple drainage, no nodules or lumps felt Nodes- no axillary nodes RESP-CTAB ABD-NABS,soft,NT,ND GU- normal external genitalia, vaginal mucosa pink and moist, cervix visualized no growth, no blood form os, No discharge, no CMT, no ovarian masses, uterus normal size EXT- No edema Pulses- Radial, DP- 2+        Assessment & Plan:      Problem List Items Addressed This Visit    None    Visit  Diagnoses    Cervical cancer screening    -  Primary    Relevant Orders       PAP, ThinPrep ASCUS Rflx HPV Rflx Type       Note: This dictation was prepared with Dragon dictation along with smaller phrase technology. Any transcriptional errors that result from this process are unintentional.

## 2014-10-29 NOTE — Assessment & Plan Note (Signed)
Add Free T3, T4 to labs, borderline TSH

## 2014-10-30 ENCOUNTER — Encounter: Payer: Self-pay | Admitting: *Deleted

## 2014-10-30 LAB — PAP THINPREP ASCUS RFLX HPV RFLX TYPE

## 2015-01-23 ENCOUNTER — Encounter: Payer: Self-pay | Admitting: Family Medicine

## 2015-01-23 ENCOUNTER — Ambulatory Visit (INDEPENDENT_AMBULATORY_CARE_PROVIDER_SITE_OTHER): Payer: 59 | Admitting: Family Medicine

## 2015-01-23 VITALS — BP 140/80 | HR 78 | Temp 98.7°F | Resp 14 | Ht 61.0 in | Wt 241.0 lb

## 2015-01-23 DIAGNOSIS — G5602 Carpal tunnel syndrome, left upper limb: Secondary | ICD-10-CM

## 2015-01-23 DIAGNOSIS — G5603 Carpal tunnel syndrome, bilateral upper limbs: Secondary | ICD-10-CM

## 2015-01-23 DIAGNOSIS — G5601 Carpal tunnel syndrome, right upper limb: Secondary | ICD-10-CM

## 2015-01-23 MED ORDER — GABAPENTIN 300 MG PO CAPS: 300.0000 mg | ORAL_CAPSULE | Freq: Every day | ORAL | Status: DC

## 2015-01-23 NOTE — Progress Notes (Signed)
Patient ID: Sabrina Roberts, female   DOB: Dec 18, 1971, 44 y.o.   MRN: [id number]   Subjective:    Patient ID: Sabrina Roberts, female    DOB: 1971-03-21, 44 y.o.   MRN: [id number]  Patient presents for Hand Numbness  patient here with bilateral hand numbness and tingling which has been worsening over the past year. She works mostly Education officer, museum work she has noticed when she is typing that her hands go numb when she is trying to do her hair or her children's hair her hands go numb as well she also gets pins and needles in the fingertips mostly in the thumb and index finger region. Occasionally she feels a sharp pain towards her right wrist. She's not tried any medications to help with the pain. She's tried doing ergonomic things with her keyboard at work but she continues to have a lot of discomfort. She even wakes up in the middle of the night with symptoms    Review Of Systems: per above   GEN- denies fatigue, fever, weight loss,weakness, recent illness HEENT- denies eye drainage, change in vision, nasal discharge, CVS- denies chest pain, palpitations RESP- denies SOB, cough, wheeze ABD- denies N/V, change in stools, abd pain GU- denies dysuria, hematuria, dribbling, incontinence MSK- denies joint pain, muscle aches, injury Neuro- denies headache, dizziness, syncope, seizure activity       Objective:    BP 140/80 mmHg  Pulse 78  Temp(Src) 98.7 F (37.1 C) (Oral)  Resp 14  Ht 5\' 1"  (1.549 m)  Wt 241 lb (109.317 kg)  BMI 45.56 kg/m2  LMP 11/16/2014 GEN- NAD, alert and oriented x3 Neck- FROM MSK- bilat hands, normal inspection, normal graps, FROM wrist, fingers Neuro- +phalens, +tinels bilat, tone normal EXT, strength equal bilat        Assessment & Plan:      Problem List Items Addressed This Visit    None    Visit Diagnoses    Bilateral carpal tunnel syndrome    -  Primary    Trial of wrist braces and gabapentin, proceed with nerve conduction in 4 weeks  if not improved    Relevant Medications    gabapentin (NEURONTIN) capsule       Note: This dictation was prepared with Dragon dictation along with smaller phrase technology. Any transcriptional errors that result from this process are unintentional.

## 2015-01-23 NOTE — Patient Instructions (Signed)
Get the wrist brace Try the gabapentin at night around 8pm F/U as needed

## 2015-04-16 ENCOUNTER — Other Ambulatory Visit: Payer: Self-pay | Admitting: Family Medicine

## 2015-04-16 NOTE — Telephone Encounter (Signed)
Ok to refill??  Last office visit 01/23/2015.  Last refill 10/29/2014, #3 refills.

## 2015-04-16 NOTE — Telephone Encounter (Signed)
OKAY TO REFILL?

## 2015-04-17 NOTE — Telephone Encounter (Signed)
Medication called to pharmacy. 

## 2015-04-21 NOTE — Consult Note (Signed)
PATIENT NAME:  Sabrina Roberts, Sabrina Roberts MR#:  [id number] DATE OF BIRTH:  [birth year]  DATE OF CONSULTATION:  12/10/2011  REFERRING PHYSICIAN:  Arletta Bale, MD CONSULTING PHYSICIAN:  Torrance Stockley H. Posey Pronto, MD  REASON FOR CONSULTATION: Medical opinion regarding the patient's sleep apnea, diabetes, GERD, and restless leg syndrome.   HISTORY OF PRESENT ILLNESS: The patient is a 44 year old African American female who is morbidly obese with a BMI greater than 50 who underwent a laparoscopic gastric bypass as well as laparoscopic cholecystectomy. The patient tolerated the procedure without any complications. She currently is doing well and complains of some epigastric discomfort. She denies any shortness of breath, no chest pains, no fevers, and no nausea or vomiting.   PAST MEDICAL HISTORY:  1. Sleep apnea.  2. Morbid obesity.  3. Diabetes type 2.  4. Gastroesophageal reflux disease.  5. Restless leg syndrome.  6. Chronic lower extremity edema.  7. Thyroid nodules.   PAST SURGICAL HISTORY:  1. Status post knee arthroscopy. 2. Tubular ligation. 3. Gastric bypass and cholecystectomy as above.   ALLERGIES: Actos, Band-Aids, and latex.   CURRENT MEDICATIONS:  1. Aleve 220 mg 2 tabs twice a day as needed for pain. 2. Byetta 10 mcg twice a day, after breakfast and supper. 3. Lasix 20 mg daily as needed.  4. Glipizide 10 mg daily.  5. Metformin 1000 mg p.o. twice a day. 6. Multivitamin 1 tab p.o. daily.  7. Omeprazole 20 mg daily.  8. Pramipexole 0.125 mg 1 to 2 tabs at bedtime as needed.  9. Tramadol 50 mg every 4 hours p.r.n.  10. Vitamin D 1 tab p.o. daily.   SOCIAL HISTORY: She does not smoke or drink; no drugs.   FAMILY HISTORY: Positive for hypertension and diabetes.  REVIEW OF SYSTEMS: CONSTITUTIONAL: Denies any fevers. No fatigue. No weakness. Has chronic back pain. No weight loss. EYES: No blurred or double vision. No pain. No redness. No inflammation. No glaucoma. No cataracts. ENT: No  tinnitus. No ear pain or hearing loss. No seasonal or year-round allergies. No epistaxis. No nasal discharge. Does have snoring. No postnasal drip. No sinus pain. No dentures. No redness of oropharynx. No difficulty swallowing. RESPIRATORY: No cough. No wheezing. No hemoptysis. No dyspnea. No asthma. No painful respirations. No tuberculosis. No pneumonia. Has sleep apnea and uses CPAP at bedtime. CARDIOVASCULAR: No chest pain. No orthopnea. Does have edema in the lower extremities. No arrhythmia. No palpitations. No syncope. No history of hypertension. No varicose veins. GI: No nausea, vomiting, or diarrhea. Abdominal discomfort currently. No hematemesis. No melena. No ulcers. Does have GERD. No jaundice. No rectal bleeding. No melena. GU: Denies any dysuria, hematuria, renal calculus, frequency, or incontinence. ENDOCRINE: Denies any polyuria, nocturia, or thyroid problems. No increase in sweating, heat or cold intolerance, or thirst. HEME/LYMPH: Denies any anemia, easy bruisability, or bleeding. SKIN: No acne. No rash. No changes in mole, hair, or skin. MUSCULOSKELETAL: Has chronic back pain and has pain in the knees due to arthritis. NEURO: No numbness. No weakness. No CVA. No transient ischemic attack. PSYCHIATRIC: No anxiety. No insomnia. No ADD.   PHYSICAL EXAMINATION:   VITAL SIGNS: Temperature 97.4, pulse 76, respirations 18, blood pressure 100/63, and oxygen saturation 94% on 2 liters.   GENERAL: The patient is an obese [place] American female, not in any acute distress.   HEENT: Head atraumatic, normocephalic. Pupils are equally round and reactive to light and accommodation. Extraocular movements are intact. Oropharynx is clear without any exudates. Nasal exam shows  no drainage or ulceration. Ear exam shows no erythema or drainage.   NECK: No thyromegaly. No carotid bruits.   HEART: Regular rate and rhythm. No murmurs, rubs, clicks, or gallops. PMI is not displaced.   LUNGS: Clear to  auscultation bilaterally without any rales, rhonchi, or wheezing.    ABDOMEN: Soft, obese, and nondistended. No hepatosplenomegaly is appreciated. No guarding. No rebound.   EXTREMITIES: No clubbing, cyanosis, or edema.   NEUROLOGICAL: Awake, alert, and oriented x3. Cranial nerves II through XII grossly intact.   SKIN: No rash.   LYMPHATICS: No lymph nodes palpable.   VASCULAR: Good DP and PT pulses.   PSYCHIATRIC: The patient is not anxious or depressed. Mood appropriate.  LABS: Earlier this morning WBC was 9.4, hemoglobin 11.3, and platelet count 232. BMP: Glucose 149, creatinine 0.74, sodium 143, potassium 3.4, calcium 7.7, magnesium 1.2, and serum phosphorous 3.9   ASSESSMENT AND PLAN: The patient is a 44 year old African American female with history of diabetes, sleep apnea, morbid obesity, gastroesophageal reflux disease, restless leg syndrome, and status post left gastric bypass and laparoscopic cholecystectomy. The patient tolerated the procedures well without any complications.  1. Diabetes: On Byetta, glipizide and metformin. On clear liquids today. We will hold these medications. Her blood sugar has been 137. We will hold her oral medications. Her sugars have started going up a little bit, so I will start her on Byetta 5 mcg twice a day, which is half dose, and then we can add her glipizide and metformin as she is eating more.  2. Electrolyte imbalances with hypokalemia and hypomagnesemia: I will give her potassium as well as magnesium. Repeat these electrolytes in the a.m.  3. History of sleep apnea: We will continue CPAP at bedtime, as written.  4. Gastroesophageal reflux disease: At this time, I will give her IV Protonix for now and then once adequately eating then we can place her back on all omeprazole as taking at home.  5. Restless leg syndrome: Resume Pramipexole at bedtime needed.  6. Miscellaneous: On Lovenox for deep vein thrombosis prophylaxis, which we will recommend  continuing. Also has an incentive spirometry by bedside, recommend every 1 hour when awake.   Thank you for the consultation.  TIME SPENT: 35 minutes. ____________________________ Lafonda Mosses Posey Pronto, MD shp:slb D: 12/11/2011 12:44:00 ET T: 12/11/2011 12:51:52 ET JOB#: [id number]  cc: Tyreak Reagle H. Posey Pronto, MD, <Dictator> Venia Carbon. Duke Salvia, MD Alric Seton MD ELECTRONICALLY SIGNED 01/02/2012 15:03

## 2015-04-21 NOTE — Op Note (Signed)
PATIENT NAME:  Sabrina Roberts, Sabrina Roberts MR#:  [id number] DATE OF BIRTH:  [birth year]  DATE OF PROCEDURE:  12/10/2011  PROCEDURE PERFORMED: Laparoscopic gastric bypass with intraoperative endoscopy followed by cholecystectomy.  PREOPERATIVE DIAGNOSIS:  Morbid obesity with a BMI of 55 associated with comorbidities of diabetes mellitus and obstructive sleep apnea. Secondary preoperative findings including cholelithiasis.  SURGEON:  Venia Carbon. Duke Salvia, MD  ASSISTANT:  Darrin Luis, PA  INTRAOPERATIVE FINDINGS:  Moderate central abdominal obesity associated with the presence of chronic cholecystitis.   PROCEDURE:   The patient was brought to the operating room and placed in the supine position.  General anesthesia was obtained with orotracheal intubation.  TED hose and Thrombi-guards applied and a footboard placed at the end of the operative bed.  The patient had a prep and drape of the lower chest and abdomen.  A 5-mm Optiview trocar was introduced in the left upper quadrant under direct visualization.  Pneumoperitoneum was obtained with carbon dioxide. Four additional trocars were introduced across the upper abdomen.  The omentum was elevated and placed in the upper abdomen revealing the ligament of Treitz.  The bowel was followed distally 50 cm, at which point it was divided with a white load Ethicon GIA stapler. The arcade vessels were divided by use of the Harmonic scalpel.  The distal small bowel was then followed an additional 100 cm at which point a side-to-side jejunojejunal anastomosis was configured.  This was accomplished by way of enterotomies in the antimesenteric margin of the biliary limb and on the common channel portion of the jejunum.  A white load stapler was used to create a common channel and the resulting enterotomy closed with a repeat firing of the white load stapler. Anti-torsion sutures were placed distal to the anastomosis and the mesenteric window closed with running 2-0 Ethibond suture.   The patient had division of the transverse colon omentum and the prior mobilized jejunum was then secured to the anterior gastric wall for future use. A Nathenson liver retractor was introduced through a subxiphoid wound and the subhepatic space exposed. The patient had a change in bed position to a deep reverse Trendelenburg position to maximize exposure of the upper abdomen.  On inspection there was no evidence of a hiatal hernia appreciated.  The patient had mobilization of the fatty tissue immediately adjacent to the angle of His and division of the attachment of the fundus and the undersurface of the left hemidiaphragm.  Again with this inspection there was no overt evidence of a hiatal hernia appreciated.  Creation of a proximal gastric pouch was initiated at this point. The arcade vessels along the lesser curvature of the stomach were divided at a point  approximately 5 cm inferior to the GE junction. A series of staples were then placed to create the proximal gastric pouch. This included a gold load GIA stapler buttressed with seam guard staple reinforcement system placed across the upper stomach, again in a transverse pattern. Two additional staples were then placed . the staples brought out just lateral to the angle of His.  This created a proximal gastric pouch of approximately 40-mL volume. A posterior distal gastrojejunal anastomosis was performed.  This was accomplished with enterotomies on the distal posterior aspect of the gastric pouch and on the antimesenteric border of the prior mobilized jejunum.  A blue load stapler fired at the 2-cm mark was used to create the initial aspect of the common channel. The resulting enterotomy was closed with a running 2-0 Vicryl suture.  This was accomplished with a 34 French bougie in place. The patient had oversewing of the suture and staple line with a second line of 2-0 Vicryl suture.  At this point an intraoperative endoscopy was performed, and with occlusion  of the jejunal distal anastomosis there was no air leak noted after insufflation and saline bath performed. The scope was withdrawn at this point.  The prior divided limbs of the omentum were placed over the gastrojejunal anastomosis and secured to the jejunum with a 2-0 Ethibond suture. The defect was closed with a running 2-0 Ethibond suture.  At this point the  pneumoperitoneum was relieved.  The trocars were removed. The wounds were previously injected with 0.25% Marcaine with epinephrine. After dermal closure with 4-0 Monocryl, Dermabond was applied. The patient was allowed to recover from anesthesia at this point having tolerated the procedure well. Estimated blood loss was less than 25 mL.   ____________________________ Venia Carbon. Duke Salvia, MD mat:bjt D: 12/14/2011 00:14:00 ET T: 12/14/2011 10:30:36 ET JOB#: [id number]  cc: Renette Butters. Tamala Julian, MD Venia Carbon Duke Salvia, MD, <Dictator> Ladora Daniel MD ELECTRONICALLY SIGNED 01/01/2012 6:56

## 2015-04-29 ENCOUNTER — Ambulatory Visit: Payer: 59 | Admitting: Family Medicine

## 2015-05-30 ENCOUNTER — Encounter: Payer: Self-pay | Admitting: Family Medicine

## 2015-06-24 ENCOUNTER — Other Ambulatory Visit: Payer: Self-pay

## 2015-09-16 ENCOUNTER — Other Ambulatory Visit: Payer: Self-pay | Admitting: Family Medicine

## 2015-09-17 NOTE — Telephone Encounter (Signed)
LRF 04/17/15 #30 + 3  LOV 10/29/14  OK refill?

## 2015-09-18 NOTE — Telephone Encounter (Signed)
Approved. #30+2. 

## 2015-09-19 ENCOUNTER — Ambulatory Visit (INDEPENDENT_AMBULATORY_CARE_PROVIDER_SITE_OTHER): Payer: 59

## 2015-09-19 ENCOUNTER — Telehealth: Payer: Self-pay | Admitting: *Deleted

## 2015-09-19 ENCOUNTER — Ambulatory Visit (INDEPENDENT_AMBULATORY_CARE_PROVIDER_SITE_OTHER): Payer: 59 | Admitting: Podiatry

## 2015-09-19 DIAGNOSIS — M21629 Bunionette of unspecified foot: Secondary | ICD-10-CM

## 2015-09-19 DIAGNOSIS — M79673 Pain in unspecified foot: Secondary | ICD-10-CM

## 2015-09-19 DIAGNOSIS — M205X9 Other deformities of toe(s) (acquired), unspecified foot: Secondary | ICD-10-CM | POA: Diagnosis not present

## 2015-09-19 DIAGNOSIS — M779 Enthesopathy, unspecified: Secondary | ICD-10-CM | POA: Diagnosis not present

## 2015-09-19 MED ORDER — ZOLPIDEM TARTRATE 10 MG PO TABS: 10.0000 mg | ORAL_TABLET | Freq: Every evening | ORAL | Status: DC | PRN

## 2015-09-19 MED ORDER — TRIAMCINOLONE ACETONIDE 10 MG/ML IJ SUSP
10.0000 mg | Freq: Once | INTRAMUSCULAR | Status: AC
Start: 2015-09-19 — End: 2015-09-19
  Administered 2015-09-19: 10 mg

## 2015-09-19 NOTE — Progress Notes (Signed)
Subjective:     Patient ID: Sabrina Roberts, female   DOB: 1971-06-08, 44 y.o.   MRN: [id number]  HPI patient states I been getting a lot of pain on the outside of both my feet and it's been present for a fairly long time but it's intensified over the last 4 months. I have tried increase my activity levels to lose weight and that his when this seemed to get worse   Review of Systems  All other systems reviewed and are negative.      Objective:   Physical Exam  Constitutional: She is oriented to person, place, and time.  Cardiovascular: Intact distal pulses.   Musculoskeletal: Normal range of motion.  Neurological: She is oriented to person, place, and time.  Skin: Skin is warm.  Nursing note and vitals reviewed.  neurovascular status intact muscle strength adequate with range of motion within normal limits. Patient does have moderate obesity and edema in the ankle region which is probably due to this and has quite a bit of discomfort with inflammation around the fifth metatarsal heads of both feet and extending in a more proximal direction. Patient does seem to have some excessive inversion upon gait analysis and I did note good digital perfusion and patient to be well oriented 3     Assessment:     Inflammatory capsulitis around the fifth metatarsal head bilateral with tendinitis-like symptoms and also plantar keratotic lesions of a moderate nature noted bilateral    Plan:     H&P and x-rays reviewed. Today I injected the head of the fifth metatarsal phalangeal joint 3 mg Kenalog 5 mg Xylocaine bilateral and debrided plantar tissue and dispensed fascial brace is in order to evert the feet and discussed possible long-term orthotics. Reappoint one week to reevaluate

## 2015-09-19 NOTE — Telephone Encounter (Signed)
Medication called to pharmacy. 

## 2015-09-19 NOTE — Progress Notes (Signed)
   Subjective:    Patient ID: Sabrina Roberts, female    DOB: 11/15/1971, 44 y.o.   MRN: [id number]  HPI Pt presents with bilateral foot pain right over left on lateral sides lasting for 1 year and worsening. Feet ache at night and after prolonged standing. Pain has worsened of 2 month period   Review of Systems  Musculoskeletal: Positive for back pain.  All other systems reviewed and are negative.      Objective:   Physical Exam        Assessment & Plan:

## 2015-09-19 NOTE — Telephone Encounter (Signed)
Received fax requesting refill on Ambien.   Ok to refill??  Last office visit 01/23/2015.  Last refill 04/17/2015, #3 refills.

## 2015-09-19 NOTE — Telephone Encounter (Signed)
ok 

## 2015-09-27 ENCOUNTER — Encounter: Payer: Self-pay | Admitting: Podiatry

## 2015-09-27 ENCOUNTER — Ambulatory Visit (INDEPENDENT_AMBULATORY_CARE_PROVIDER_SITE_OTHER): Payer: 59 | Admitting: Podiatry

## 2015-09-27 VITALS — BP 141/86 | HR 66 | Resp 16

## 2015-09-27 DIAGNOSIS — M779 Enthesopathy, unspecified: Secondary | ICD-10-CM | POA: Diagnosis not present

## 2015-09-27 MED ORDER — TRIAMCINOLONE ACETONIDE 10 MG/ML IJ SUSP
10.0000 mg | Freq: Once | INTRAMUSCULAR | Status: AC
Start: 2015-09-27 — End: 2015-09-27
  Administered 2015-09-27: 10 mg

## 2015-09-27 NOTE — Progress Notes (Signed)
Subjective:     Patient ID: Sabrina Roberts, female   DOB: 05-01-1971, 44 y.o.   MRN: [id number]  HPI patient presents stating the bottom of the foot feels some better but I still get the lesions and I'm getting inflammation on the side of the joint on the right foot. I am quite improved from previous   Review of Systems     Objective:   Physical Exam Neurovascular status intact muscle strength adequate with keratotic lesion sub-fifth metatarsal bilateral that is improved as far as pain and inflammation with discomfort around the fifth MPJ right more on the dorsal and lateral side    Assessment:     Inflammatory capsulitis present fifth MPJ right with improvement of the plantar surface    Plan:     Reviewed condition and did careful injection of the dorsal capsule 3 mg Kenalog 5 mg Xylocaine and scanned for custom orthotics to reduce plantar pressures on the foot. Reappoint when those are returned

## 2015-10-07 LAB — HM DIABETES EYE EXAM

## 2015-10-18 ENCOUNTER — Ambulatory Visit: Payer: 59 | Admitting: *Deleted

## 2015-10-18 DIAGNOSIS — M779 Enthesopathy, unspecified: Secondary | ICD-10-CM

## 2015-10-18 NOTE — Progress Notes (Signed)
Patient ID: Sabrina Roberts, female   DOB: May 26, 1971, 44 y.o.   MRN: [id number] Patient presents for orthotic pick up.  Verbal and written break in and wear instructions given.  Patient will follow up in 4 weeks if symptoms worsen or fail to improve.

## 2015-10-18 NOTE — Patient Instructions (Signed)

## 2015-10-21 ENCOUNTER — Encounter: Payer: Self-pay | Admitting: *Deleted

## 2015-10-29 ENCOUNTER — Encounter: Payer: Self-pay | Admitting: Family Medicine

## 2015-11-15 ENCOUNTER — Ambulatory Visit: Payer: 59 | Admitting: Podiatry

## 2016-02-11 ENCOUNTER — Telehealth: Payer: Self-pay | Admitting: Podiatry

## 2016-02-11 NOTE — Telephone Encounter (Signed)
Left voicemail for pt. To call to schedule appt

## 2016-04-20 ENCOUNTER — Ambulatory Visit (INDEPENDENT_AMBULATORY_CARE_PROVIDER_SITE_OTHER): Payer: 59 | Admitting: Family Medicine

## 2016-04-20 ENCOUNTER — Encounter: Payer: Self-pay | Admitting: Family Medicine

## 2016-04-20 VITALS — BP 130/74 | HR 72 | Temp 98.0°F | Resp 14 | Ht 61.0 in | Wt 254.0 lb

## 2016-04-20 DIAGNOSIS — G5603 Carpal tunnel syndrome, bilateral upper limbs: Secondary | ICD-10-CM

## 2016-04-20 DIAGNOSIS — G5600 Carpal tunnel syndrome, unspecified upper limb: Secondary | ICD-10-CM | POA: Insufficient documentation

## 2016-04-20 DIAGNOSIS — G4700 Insomnia, unspecified: Secondary | ICD-10-CM | POA: Diagnosis not present

## 2016-04-20 DIAGNOSIS — E119 Type 2 diabetes mellitus without complications: Secondary | ICD-10-CM

## 2016-04-20 DIAGNOSIS — R946 Abnormal results of thyroid function studies: Secondary | ICD-10-CM | POA: Diagnosis not present

## 2016-04-20 DIAGNOSIS — R5382 Chronic fatigue, unspecified: Secondary | ICD-10-CM

## 2016-04-20 DIAGNOSIS — E785 Hyperlipidemia, unspecified: Secondary | ICD-10-CM | POA: Diagnosis not present

## 2016-04-20 DIAGNOSIS — E559 Vitamin D deficiency, unspecified: Secondary | ICD-10-CM

## 2016-04-20 LAB — COMPREHENSIVE METABOLIC PANEL
ALT: 10 U/L (ref 6–29)
AST: 13 U/L (ref 10–30)
Albumin: 3.9 g/dL (ref 3.6–5.1)
Alkaline Phosphatase: 95 U/L (ref 33–115)
BUN: 7 mg/dL (ref 7–25)
CO2: 25 mmol/L (ref 20–31)
Calcium: 8.4 mg/dL — ABNORMAL LOW (ref 8.6–10.2)
Chloride: 108 mmol/L (ref 98–110)
Creat: 0.53 mg/dL (ref 0.50–1.10)
Glucose, Bld: 97 mg/dL (ref 70–99)
Potassium: 4.1 mmol/L (ref 3.5–5.3)
Sodium: 142 mmol/L (ref 135–146)
Total Bilirubin: 0.6 mg/dL (ref 0.2–1.2)
Total Protein: 6.4 g/dL (ref 6.1–8.1)

## 2016-04-20 LAB — CBC WITH DIFFERENTIAL/PLATELET
Basophils Absolute: 0 cells/uL (ref 0–200)
Basophils Relative: 0 %
Eosinophils Absolute: 228 cells/uL (ref 15–500)
Eosinophils Relative: 4 %
HCT: 36.7 % (ref 35.0–45.0)
Hemoglobin: 11.8 g/dL — ABNORMAL LOW (ref 12.0–15.0)
Lymphocytes Relative: 28 %
Lymphs Abs: 1596 cells/uL (ref 850–3900)
MCH: 29.3 pg (ref 27.0–33.0)
MCHC: 32.2 g/dL (ref 32.0–36.0)
MCV: 91.1 fL (ref 80.0–100.0)
MPV: 11.1 fL (ref 7.5–12.5)
Monocytes Absolute: 456 cells/uL (ref 200–950)
Monocytes Relative: 8 %
Neutro Abs: 3420 cells/uL (ref 1500–7800)
Neutrophils Relative %: 60 %
Platelets: 324 10*3/uL (ref 140–400)
RBC: 4.03 MIL/uL (ref 3.80–5.10)
RDW: 13.7 % (ref 11.0–15.0)
WBC: 5.7 10*3/uL (ref 3.8–10.8)

## 2016-04-20 LAB — LIPID PANEL
Cholesterol: 162 mg/dL (ref 125–200)
HDL: 59 mg/dL (ref >=46)
LDL Cholesterol: 88 mg/dL (ref <130)
Total CHOL/HDL Ratio: 2.7 Ratio (ref <=5.0)
Triglycerides: 74 mg/dL (ref <150)
VLDL: 15 mg/dL (ref <30)

## 2016-04-20 LAB — HEMOGLOBIN A1C
Hgb A1c MFr Bld: 6.8 % — ABNORMAL HIGH (ref <5.7)
Mean Plasma Glucose: 148 mg/dL

## 2016-04-20 LAB — VITAMIN B12: Vitamin B-12: 194 pg/mL — ABNORMAL LOW (ref 200–1100)

## 2016-04-20 LAB — TSH: TSH: 0.6 mIU/L

## 2016-04-20 MED ORDER — ZOLPIDEM TARTRATE 10 MG PO TABS
10.0000 mg | ORAL_TABLET | Freq: Every evening | ORAL | Status: DC | PRN
Start: 2016-04-20 — End: 2016-04-22

## 2016-04-20 MED ORDER — GABAPENTIN 300 MG PO CAPS: 300.0000 mg | ORAL_CAPSULE | Freq: Two times a day (BID) | ORAL | Status: DC

## 2016-04-20 NOTE — Assessment & Plan Note (Signed)
Recheck TFT 

## 2016-04-20 NOTE — Progress Notes (Signed)
Patient ID: Sabrina Roberts, female   DOB: 1971-05-22, 45 y.o.   MRN: [id number]   Subjective:    Patient ID: Sabrina Roberts, female    DOB: 14-Apr-1971, 45 y.o.   MRN: [id number]  Patient presents for F/U; Insomia; and B Hand Tingling  She here for follow-up. She was last seen in January 2016. At that time we started workup for carpal tunnel syndrome. She was started on gabapentin and advised to use wrist braces if they did not improve she was present have not conduction study done that this is now the time she is following up. The gabapentin worked well in the beginning however it seems to worn off. She is also using a wrist braces but this causes more irritation to her wrist    She also complains of insomnia she is was last on Ambien which worked well but she is out of this medication.   Morbid obesity she's actually underwent gastric bypass surgery and has steadily been gaining weight back. She has history of diabetes mellitus which was diet-controlled after her weight loss as well as hyperlipidemia. She states that the family is eating very poorly but now she has improved that. She is down 10 pounds in the past 3 months. She is now walking with some coworkers at work. She states that there's been a lot of stress with her husband being out of work she is the only one working there also taking care of the children and her teenagers trying to get into the university. She honestly has not spent time looking after herself.  Review Of Systems:  GEN- + fatigue, fever, weight loss,weakness, recent illness HEENT- denies eye drainage, change in vision, nasal discharge, CVS- denies chest pain, palpitations RESP- denies SOB, cough, wheeze ABD- denies N/V, change in stools, abd pain GU- denies dysuria, hematuria, dribbling, incontinence MSK- + joint pain, muscle aches, injury Neuro- denies headache, dizziness, syncope, seizure activity       Objective:    BP 130/74 mmHg  Pulse 72   Temp(Src) 98 F (36.7 C) (Oral)  Resp 14  Ht 5\' 1"  (1.549 m)  Wt 254 lb (115.214 kg)  BMI 48.02 kg/m2 GEN- NAD, alert and oriented x3,obese HEENT- PERRL, EOMI, non injected sclera, pink conjunctiva, MMM, oropharynx clear Neck- Supple, no thyromegaly CVS- RRR, no murmur RESP-CTAB ABD-NABS,soft,NT,ND Psych- normal affect and mood  Neuro- normal tone Upper ext, decreased grasp bilat, no atrophy in hands, +tinels/phalens  EXT- No edema Pulses- Radial, DP- 2+        Assessment & Plan:      Problem List Items Addressed This Visit    None      Note: This dictation was prepared with Dragon dictation along with smaller phrase technology. Any transcriptional errors that result from this process are unintentional.

## 2016-04-20 NOTE — Patient Instructions (Signed)
Restart ambien Nerve conduction studies for hands  Take gabapentin twice a day  We will call with lab results  F/U pending results

## 2016-04-20 NOTE — Assessment & Plan Note (Signed)
Nerve conduction studies to be done. I will increase gabapentin to 300 mg twice a day for now.

## 2016-04-20 NOTE — Assessment & Plan Note (Signed)
I think this multifactorial. There is a lot of stress at home her weight is increasing she has a carpal tunnel which also contributes to some pain at night. Didn't restart the Ambien. We will address the other issues per above

## 2016-04-20 NOTE — Assessment & Plan Note (Signed)
Concern for DM with her increasing weight, check labs  Discussed healthy eating, she is hard to have bariatric surgery has been through multiple nutrition classes. She is to quit with the information on how eat  properly.

## 2016-04-21 LAB — VITAMIN D 25 HYDROXY (VIT D DEFICIENCY, FRACTURES): Vit D, 25-Hydroxy: 11 ng/mL — ABNORMAL LOW (ref 30–100)

## 2016-04-22 ENCOUNTER — Other Ambulatory Visit: Payer: Self-pay | Admitting: Family Medicine

## 2016-04-22 ENCOUNTER — Telehealth: Payer: Self-pay | Admitting: Family Medicine

## 2016-04-22 ENCOUNTER — Other Ambulatory Visit: Payer: Self-pay | Admitting: *Deleted

## 2016-04-22 ENCOUNTER — Encounter: Payer: Self-pay | Admitting: *Deleted

## 2016-04-22 MED ORDER — BLOOD GLUCOSE SYSTEM PAK KIT: PACK | Status: DC

## 2016-04-22 MED ORDER — CYANOCOBALAMIN 1000 MCG/ML IJ SOLN: 1000.0000 ug | INTRAMUSCULAR | Status: DC

## 2016-04-22 MED ORDER — ZOLPIDEM TARTRATE 10 MG PO TABS: 10.0000 mg | ORAL_TABLET | Freq: Every evening | ORAL | Status: DC | PRN

## 2016-04-22 MED ORDER — BLOOD GLUCOSE TEST VI STRP: ORAL_STRIP | Status: DC

## 2016-04-22 MED ORDER — SYRINGE (DISPOSABLE) 3 ML MISC: Status: DC

## 2016-04-22 MED ORDER — "NEEDLE (DISP) 25G X 1"" MISC": Status: DC

## 2016-04-22 MED ORDER — LANCET DEVICES MISC: Status: DC

## 2016-04-22 MED ORDER — VITAMIN D (ERGOCALCIFEROL) 1.25 MG (50000 UNIT) PO CAPS: ORAL_CAPSULE | ORAL | Status: DC

## 2016-04-22 MED ORDER — LANCETS MISC: Status: DC

## 2016-04-22 MED ORDER — METFORMIN HCL 500 MG PO TABS: 500.0000 mg | ORAL_TABLET | Freq: Every day | ORAL | Status: DC

## 2016-04-22 NOTE — Telephone Encounter (Signed)
[facility] sent fax stating that they never received a RX for Ambien on 04/20/16 and could we resend it please. Med called out and prescribing md changed to reflect correct pcp.

## 2016-06-10 ENCOUNTER — Ambulatory Visit (INDEPENDENT_AMBULATORY_CARE_PROVIDER_SITE_OTHER): Payer: 59 | Admitting: Family Medicine

## 2016-06-10 ENCOUNTER — Encounter: Payer: Self-pay | Admitting: Family Medicine

## 2016-06-10 VITALS — BP 124/64 | HR 68 | Temp 98.3°F | Resp 14 | Ht 61.0 in | Wt 251.0 lb

## 2016-06-10 DIAGNOSIS — M5136 Other intervertebral disc degeneration, lumbar region: Secondary | ICD-10-CM

## 2016-06-10 DIAGNOSIS — M5126 Other intervertebral disc displacement, lumbar region: Secondary | ICD-10-CM | POA: Diagnosis not present

## 2016-06-10 DIAGNOSIS — G5603 Carpal tunnel syndrome, bilateral upper limbs: Secondary | ICD-10-CM

## 2016-06-10 DIAGNOSIS — M5432 Sciatica, left side: Secondary | ICD-10-CM

## 2016-06-10 MED ORDER — CYCLOBENZAPRINE HCL 10 MG PO TABS: 10.0000 mg | ORAL_TABLET | Freq: Three times a day (TID) | ORAL | Status: DC | PRN

## 2016-06-10 MED ORDER — HYDROCODONE-ACETAMINOPHEN 5-325 MG PO TABS: 1.0000 | ORAL_TABLET | Freq: Four times a day (QID) | ORAL | Status: DC | PRN

## 2016-06-10 MED ORDER — PREDNISONE 10 MG PO TABS: ORAL_TABLET | ORAL | Status: DC

## 2016-06-10 NOTE — Progress Notes (Signed)
Patient ID: Sabrina Roberts, female   DOB: 1971/01/23, 45 y.o.   MRN: [id number]    Subjective:    Patient ID: Sabrina Roberts, female    DOB: [birth year], 45 y.o.   MRN: [id number]  Patient presents for Wrist Pain and Lower Back Pain  continued carpal tunnel symptoms, has not had nerve conduction study done, gabapentin is not helping, numbness both hands R>l with tingling and aching into forearms.      Back pain- history of T11-T12 dic bulge with some spinal cord impingment , over past few weeks has been arranging and packing up things for teenage daughter who graduated. She has also been exerciseing more, has pain in lower back radiating to both legs, feels weak in her legs, no change in bowel or bladder, took ibuprofen with no relief.    Review Of Systems:  GEN- denies fatigue, fever, weight loss,weakness, recent illness HEENT- denies eye drainage, change in vision, nasal discharge, CVS- denies chest pain, palpitations RESP- denies SOB, cough, wheeze ABD- denies N/V, change in stools, abd pain GU- denies dysuria, hematuria, dribbling, incontinence MSK- + joint pain, muscle aches, injury Neuro- denies headache, dizziness, syncope, seizure activity       Objective:    BP 124/64 mmHg  Pulse 68  Temp(Src) 98.3 F (36.8 C) (Oral)  Resp 14  Ht 5\' 1"  (1.549 m)  Wt 251 lb (113.853 kg)  BMI 47.45 kg/m2 GEN- NAD, alert and oriented x3 MSK- TTP midline lumbar spine, + SLR Left side, decreased ROM, antalgic gait, fair ROM Hips ( decreased due to habitus) Neuro- sensation in tact- motor equal bilat - strength decreased bilat (pain/effor), DTR symmetric  EXT- No edema Pulses- Radial, DP- 2+        Assessment & Plan:      Problem List Items Addressed This Visit    Carpal tunnel syndrome    We will schedule nerve conduction ASAP Continue braces and gabapentin      Relevant Medications   cyclobenzaprine (FLEXERIL) 10 MG tablet    Other Visit Diagnoses    Bulging lumbar  disc    -  Primary    known disc disease with nerve root impingment in past, exaccerbated now, will try prednisone taper, flexeril pain meds, heat/stretches first. no improvement image    Sciatica associated with disorder of lumbar spine, left        Relevant Medications    cyclobenzaprine (FLEXERIL) 10 MG tablet       Note: This dictation was prepared with Dragon dictation along with smaller phrase technology. Any transcriptional errors that result from this process are unintentional.

## 2016-06-10 NOTE — Patient Instructions (Signed)
Nerve Conduction  Take prednisone, flexeril and pain medication as prescribed F/U 3 months

## 2016-06-11 ENCOUNTER — Encounter: Payer: Self-pay | Admitting: Family Medicine

## 2016-06-11 NOTE — Assessment & Plan Note (Signed)
We will schedule nerve conduction ASAP Continue braces and gabapentin

## 2016-06-17 ENCOUNTER — Encounter: Payer: Self-pay | Admitting: Family Medicine

## 2016-07-13 DIAGNOSIS — M545 Low back pain: Secondary | ICD-10-CM | POA: Diagnosis not present

## 2016-07-13 DIAGNOSIS — R208 Other disturbances of skin sensation: Secondary | ICD-10-CM | POA: Diagnosis not present

## 2016-07-13 DIAGNOSIS — G5603 Carpal tunnel syndrome, bilateral upper limbs: Secondary | ICD-10-CM | POA: Diagnosis not present

## 2016-07-13 DIAGNOSIS — E119 Type 2 diabetes mellitus without complications: Secondary | ICD-10-CM | POA: Diagnosis not present

## 2016-07-13 DIAGNOSIS — E6609 Other obesity due to excess calories: Secondary | ICD-10-CM | POA: Diagnosis not present

## 2016-07-14 ENCOUNTER — Telehealth: Payer: Self-pay | Admitting: Family Medicine

## 2016-07-14 NOTE — Telephone Encounter (Signed)
Pt wanted Dr. Buelah Manis to know that Dr. Lily Lovings also wants to check her legs due to severe numbness, especially on the left side.

## 2016-07-14 NOTE — Telephone Encounter (Signed)
noted 

## 2016-07-14 NOTE — Telephone Encounter (Signed)
Call placed to patient to inquire.   States that she has had NCS on B hands for carpal tunnel, but Dr. [name] is going to check B legs d/t pain. Test has been ordered and scheduled.   Patient also wanted to advise that prednisone did help pain, but patient still has some numbness.   MD to be made aware.

## 2016-07-23 ENCOUNTER — Telehealth: Payer: Self-pay | Admitting: Family Medicine

## 2016-07-23 DIAGNOSIS — G5603 Carpal tunnel syndrome, bilateral upper limbs: Secondary | ICD-10-CM | POA: Diagnosis not present

## 2016-07-23 NOTE — Telephone Encounter (Signed)
Ok to refill??  Last office visit/ refill 06/10/2016. 

## 2016-07-23 NOTE — Telephone Encounter (Signed)
Patient needs rx for her hydrocodone  [phone]

## 2016-07-24 MED ORDER — HYDROCODONE-ACETAMINOPHEN 5-325 MG PO TABS: 1.0000 | ORAL_TABLET | Freq: Four times a day (QID) | ORAL | 0 refills | Status: DC | PRN

## 2016-07-24 NOTE — Telephone Encounter (Signed)
Prescription printed and patient made aware to come to office to pick up after 2pm on 07/24/2016 per VM.

## 2016-07-24 NOTE — Telephone Encounter (Signed)
Okay to refill? 

## 2016-07-29 ENCOUNTER — Encounter: Payer: Self-pay | Admitting: Family Medicine

## 2016-08-03 ENCOUNTER — Encounter: Payer: Self-pay | Admitting: Family Medicine

## 2016-08-03 MED ORDER — FLUCONAZOLE 150 MG PO TABS: 150.0000 mg | ORAL_TABLET | Freq: Once | ORAL | 0 refills | Status: AC

## 2016-08-06 DIAGNOSIS — M5416 Radiculopathy, lumbar region: Secondary | ICD-10-CM | POA: Diagnosis not present

## 2016-08-06 DIAGNOSIS — G5603 Carpal tunnel syndrome, bilateral upper limbs: Secondary | ICD-10-CM | POA: Diagnosis not present

## 2016-08-06 DIAGNOSIS — E1140 Type 2 diabetes mellitus with diabetic neuropathy, unspecified: Secondary | ICD-10-CM | POA: Diagnosis not present

## 2016-08-15 ENCOUNTER — Other Ambulatory Visit: Payer: Self-pay | Admitting: Family Medicine

## 2016-08-17 NOTE — Telephone Encounter (Signed)
Ok to refill 

## 2016-08-17 NOTE — Telephone Encounter (Signed)
okay

## 2016-08-18 NOTE — Telephone Encounter (Signed)
Prescription sent to pharmacy.

## 2016-08-19 ENCOUNTER — Ambulatory Visit (INDEPENDENT_AMBULATORY_CARE_PROVIDER_SITE_OTHER): Payer: 59 | Admitting: Orthopaedic Surgery

## 2016-08-19 ENCOUNTER — Telehealth: Payer: Self-pay | Admitting: Orthopedic Surgery

## 2016-08-19 ENCOUNTER — Encounter: Payer: Self-pay | Admitting: Orthopaedic Surgery

## 2016-08-19 ENCOUNTER — Telehealth: Payer: Self-pay

## 2016-08-19 VITALS — BP 136/86 | HR 82 | Temp 97.0°F | Ht 64.0 in | Wt 253.0 lb

## 2016-08-19 DIAGNOSIS — G5603 Carpal tunnel syndrome, bilateral upper limbs: Secondary | ICD-10-CM | POA: Diagnosis not present

## 2016-08-19 NOTE — Telephone Encounter (Signed)
tues 920

## 2016-08-19 NOTE — Telephone Encounter (Signed)
Patient was seen by Dr. [name] this morning.  He would like to refer this patient to Dr. [name] for evaluation of Carpal Tunnel Syndrome.    Please advise of appointment.  Thanks

## 2016-08-19 NOTE — Telephone Encounter (Signed)
I left msg on machine that her appt is scheduled 8/29 at 9:20am per Dr [name].

## 2016-08-19 NOTE — Progress Notes (Signed)
Subjective:  My hands go numb    Patient ID: Sabrina Roberts, female    DOB: 1971-04-11, 45 y.o.   MRN: [id number]  HPI  She has bilateral carpal tunnel syndrome.  She has been seen by Dr. [name] and has positive nerve conduction studies for carpal tunnel.  She has nocturnal pain and numbness.  It is getting worse.  She is dropping things.  Nothing helps now.  She has no trauma.    I have talked to her about carpal tunnel surgery.  I will have her see Dr. [name] for this.  She is agreeable.  Review of Systems  HENT: Negative for congestion.   Cardiovascular: Negative for chest pain and leg swelling.  Endocrine: Negative for cold intolerance.  Musculoskeletal: Positive for arthralgias.  Allergic/Immunologic: Negative for environmental allergies.   Past Medical History:  Diagnosis Date  . Arthritis   . Diabetes mellitus   . Hyperlipidemia     Past Surgical History:  Procedure Laterality Date  . CESAREAN SECTION  2002  . CHOLECYSTECTOMY  dec 2012  . GASTRIC BYPASS  dec 2012  . ORTHOPEDIC SURGERY      Current Outpatient Prescriptions on File Prior to Visit  Medication Sig Dispense Refill  . Blood Glucose Monitoring Suppl (BLOOD GLUCOSE SYSTEM PAK) KIT Please dispense based on patient and insurance preference. Use as directed to monitor FSBS 1x daily. Dx: E11.9. 1 each 1  . cholecalciferol (VITAMIN D) 1000 UNITS tablet Take 1,000 Units by mouth daily.    . cyanocobalamin (,VITAMIN B-12,) 1000 MCG/ML injection Inject 1 mL (1,000 mcg total) into the muscle every 30 (thirty) days. 3 mL 3  . Glucose Blood (BLOOD GLUCOSE TEST STRIPS) STRP Please dispense based on patient and insurance preference. Use as directed to monitor FSBS 1x daily. Dx: E11.9. 100 each 3  . Lancet Devices MISC Please dispense based on patient and insurance preference. Use as directed to monitor FSBS 1x daily. Dx: E11.9. 1 each 1  . Lancets MISC Please dispense based on patient and insurance preference. Use  as directed to monitor FSBS 1x daily. Dx: E11.9. 100 each 3  . metFORMIN (GLUCOPHAGE) 500 MG tablet Take 1 tablet (500 mg total) by mouth daily with breakfast. 30 tablet 3  . Multiple Vitamin (MULTIVITAMIN) tablet Take 1 tablet by mouth 2 (two) times daily.    Marland Kitchen NEEDLE, DISP, 25 G 25G X 1" MISC Use to inject Vit B12 IM Q month 3 each 3  . Syringe, Disposable, 3 ML MISC Use to inject Vit B12 IM Q month 3 each 3  . Vitamin D, Ergocalciferol, (DRISDOL) 50000 units CAPS capsule Take (1) capsule by mouth every week x12 weeks, then stop. 12 capsule 0  . zolpidem (AMBIEN) 10 MG tablet Take 1 tablet (10 mg total) by mouth at bedtime as needed for sleep. 30 tablet 3  . cyclobenzaprine (FLEXERIL) 10 MG tablet TAKE ONE TABLET BY MOUTH THREE TIMES DAILY AS NEEDED FOR MUSCLE SPASMS (Patient not taking: Reported on 08/19/2016) 30 tablet 0  . gabapentin (NEURONTIN) 300 MG capsule Take 1 capsule (300 mg total) by mouth 2 (two) times daily. (Patient not taking: Reported on 08/19/2016) 60 capsule 3  . HYDROcodone-acetaminophen (NORCO) 5-325 MG tablet Take 1 tablet by mouth every 6 (six) hours as needed for moderate pain. (Patient not taking: Reported on 08/19/2016) 30 tablet 0  . predniSONE (DELTASONE) 10 MG tablet Take 25m x 3 days,233mx 3 days, 1028m 3 days (Patient not  taking: Reported on 08/19/2016) 21 tablet 0   No current facility-administered medications on file prior to visit.     Social History   Social History  . Marital status: Married    Spouse name: N/A  . Number of children: N/A  . Years of education: N/A   Occupational History  . Not on file.   Social History Main Topics  . Smoking status: Never Smoker  . Smokeless tobacco: Never Used  . Alcohol use No  . Drug use: No  . Sexual activity: Yes   Other Topics Concern  . Not on file   Social History Narrative  . No narrative on file    Family History  Problem Relation Age of Onset  . Hypertension Mother   . Depression Mother   .  Diabetes Mother   . Hypertension Father   . Hyperlipidemia Father   . Cancer Paternal Grandmother   . Cancer Paternal Grandfather     BP 136/86   Pulse 82   Temp 97 F (36.1 C)   Ht 5' 4" (1.626 m)   Wt 253 lb (114.8 kg)   BMI 43.43 kg/m       Objective:   Physical Exam  Constitutional: She is oriented to person, place, and time. She appears well-developed and well-nourished.  HENT:  Head: Normocephalic and atraumatic.  Eyes: Conjunctivae and EOM are normal. Pupils are equal, round, and reactive to light.  Neck: Normal range of motion. Neck supple.  Cardiovascular: Normal rate, regular rhythm and intact distal pulses.   Pulmonary/Chest: Effort normal.  Abdominal: Soft.  Musculoskeletal: She exhibits tenderness (She has positive Tinel and Phalens of both wrists, more pain on the right.  ROM full.  Decreased sensation median nerve distribution both hands.).  Neurological: She is alert and oriented to person, place, and time. She displays normal reflexes. No cranial nerve deficit. She exhibits normal muscle tone. Coordination normal.  Skin: Skin is warm and dry.  Psychiatric: She has a normal mood and affect. Her behavior is normal. Judgment and thought content normal.          Assessment & Plan:   Encounter Diagnosis  Name Primary?  . Carpal tunnel syndrome, bilateral Yes   To see Dr. [name] for surgery.  Call if any problem.  Electronically Signed Sanjuana Kava, MD 8/23/20179:09 AM

## 2016-08-20 ENCOUNTER — Telehealth: Payer: Self-pay | Admitting: *Deleted

## 2016-08-20 NOTE — Telephone Encounter (Signed)
Records indicate prescription refill appropriate for Norco.  Ok to refill??  Last office visit 6/14/207.  Last refill 07/24/2016.

## 2016-08-21 MED ORDER — HYDROCODONE-ACETAMINOPHEN 5-325 MG PO TABS: 1.0000 | ORAL_TABLET | Freq: Four times a day (QID) | ORAL | 0 refills | Status: DC | PRN

## 2016-08-21 NOTE — Telephone Encounter (Signed)
Prescription printed and patient made aware to come to office to pick up on 08/24/2016.

## 2016-08-21 NOTE — Telephone Encounter (Signed)
okay

## 2016-08-25 ENCOUNTER — Ambulatory Visit (INDEPENDENT_AMBULATORY_CARE_PROVIDER_SITE_OTHER): Payer: 59 | Admitting: Orthopedic Surgery

## 2016-08-25 ENCOUNTER — Encounter: Payer: Self-pay | Admitting: Orthopedic Surgery

## 2016-08-25 VITALS — BP 153/93 | HR 85 | Ht 64.0 in | Wt 253.0 lb

## 2016-08-25 DIAGNOSIS — G5603 Carpal tunnel syndrome, bilateral upper limbs: Secondary | ICD-10-CM | POA: Diagnosis not present

## 2016-08-25 DIAGNOSIS — G5601 Carpal tunnel syndrome, right upper limb: Secondary | ICD-10-CM | POA: Diagnosis not present

## 2016-08-25 NOTE — Patient Instructions (Signed)
SURGERY 10/01/16 OUT OF WORK 3 WEEKS FOLLOWING

## 2016-08-25 NOTE — Progress Notes (Signed)
Chief Complaint  Patient presents with  . Carpal Tunnel    bilateral carpal tunnel   HPI  As noted by Dr [name]   HPI   She has bilateral carpal tunnel syndrome.  She has been seen by Dr. [name] and has positive nerve conduction studies for carpal tunnel.  She has nocturnal pain and numbness.  It is getting worse.  She is dropping things.  Nothing helps now.  She has no trauma.     I have talked to her about carpal tunnel surgery.  I will have her see Dr. [name] for this.  She is agreeable.   Review of Systems  HENT: Negative for congestion.   Cardiovascular: Negative for chest pain and leg swelling.  Endocrine: Negative for cold intolerance.  Musculoskeletal: Positive for arthralgias.  Allergic/Immunologic: Negative for environmental allergies.    ROS see above   Past Medical History:  Diagnosis Date  . Arthritis   . Diabetes mellitus   . Hyperlipidemia     Past Surgical History:  Procedure Laterality Date  . CESAREAN SECTION  2002  . CHOLECYSTECTOMY  dec 2012  . GASTRIC BYPASS  dec 2012  . ORTHOPEDIC SURGERY     Family History  Problem Relation Age of Onset  . Hypertension Mother   . Depression Mother   . Diabetes Mother   . Hypertension Father   . Hyperlipidemia Father   . Cancer Paternal Grandmother   . Cancer Paternal Grandfather    Social History  Substance Use Topics  . Smoking status: Never Smoker  . Smokeless tobacco: Never Used  . Alcohol use No    Current Outpatient Prescriptions:  .  Blood Glucose Monitoring Suppl (BLOOD GLUCOSE SYSTEM PAK) KIT, Please dispense based on patient and insurance preference. Use as directed to monitor FSBS 1x daily. Dx: E11.9., Disp: 1 each, Rfl: 1 .  cholecalciferol (VITAMIN D) 1000 UNITS tablet, Take 1,000 Units by mouth daily., Disp: , Rfl:  .  cyanocobalamin (,VITAMIN B-12,) 1000 MCG/ML injection, Inject 1 mL (1,000 mcg total) into the muscle every 30 (thirty) days., Disp: 3 mL, Rfl: 3 .  Glucose Blood (BLOOD  GLUCOSE TEST STRIPS) STRP, Please dispense based on patient and insurance preference. Use as directed to monitor FSBS 1x daily. Dx: E11.9., Disp: 100 each, Rfl: 3 .  HYDROcodone-acetaminophen (NORCO) 5-325 MG tablet, Take 1 tablet by mouth every 6 (six) hours as needed for moderate pain., Disp: 30 tablet, Rfl: 0 .  Lancet Devices MISC, Please dispense based on patient and insurance preference. Use as directed to monitor FSBS 1x daily. Dx: E11.9., Disp: 1 each, Rfl: 1 .  Lancets MISC, Please dispense based on patient and insurance preference. Use as directed to monitor FSBS 1x daily. Dx: E11.9., Disp: 100 each, Rfl: 3 .  metFORMIN (GLUCOPHAGE) 500 MG tablet, Take 1 tablet (500 mg total) by mouth daily with breakfast., Disp: 30 tablet, Rfl: 3 .  Multiple Vitamin (MULTIVITAMIN) tablet, Take 1 tablet by mouth 2 (two) times daily., Disp: , Rfl:  .  NEEDLE, DISP, 25 G 25G X 1" MISC, Use to inject Vit B12 IM Q month, Disp: 3 each, Rfl: 3 .  Syringe, Disposable, 3 ML MISC, Use to inject Vit B12 IM Q month, Disp: 3 each, Rfl: 3 .  Vitamin D, Ergocalciferol, (DRISDOL) 50000 units CAPS capsule, Take (1) capsule by mouth every week x12 weeks, then stop., Disp: 12 capsule, Rfl: 0 .  zolpidem (AMBIEN) 10 MG tablet, Take 1 tablet (10 mg total)  by mouth at bedtime as needed for sleep., Disp: 30 tablet, Rfl: 3 .  cyclobenzaprine (FLEXERIL) 10 MG tablet, TAKE ONE TABLET BY MOUTH THREE TIMES DAILY AS NEEDED FOR MUSCLE SPASMS (Patient not taking: Reported on 08/19/2016), Disp: 30 tablet, Rfl: 0 .  gabapentin (NEURONTIN) 300 MG capsule, Take 1 capsule (300 mg total) by mouth 2 (two) times daily. (Patient not taking: Reported on 08/19/2016), Disp: 60 capsule, Rfl: 3 .  predniSONE (DELTASONE) 10 MG tablet, Take 39m x 3 days,273mx 3 days, 1061m 3 days (Patient not taking: Reported on 08/19/2016), Disp: 21 tablet, Rfl: 0  BP (!) 153/93   Pulse 85   Ht '5\' 4"'  (1.626 m)   Wt 253 lb (114.8 kg)   BMI 43.43 kg/m   Physical  Exam  Constitutional: She is oriented to person, place, and time. She appears well-developed and well-nourished. No distress.  HENT:  Head: Normocephalic and atraumatic.  Cardiovascular: Normal rate and intact distal pulses.   Neurological: She is alert and oriented to person, place, and time. She has normal reflexes. She exhibits normal muscle tone. Coordination normal.  Skin: Skin is warm and dry. No rash noted. She is not diaphoretic. No erythema. No pallor.  Psychiatric: She has a normal mood and affect. Her behavior is normal. Judgment and thought content normal.    Ortho Exam Right and left hand and wrist exam. No swelling is noted there is tenderness over both carpal tunnels. She has maintained equal range of motion of both wrists and hands without any instability. Negative Watson test. She has weak grip strength in each hand right is worse than left. Skin clean dry and intact with warm with no nodules. Pulses are good temperature is normal radial pulses 2+ in each hand. Allen's test is normal.  Lymph nodes in the elbow area normal nonpalpable. She has sensory loss in the thumb index long and part of the ring finger and both right and left hand    ASSESSMENT: Nerve conduction study is positive for bilateral carpal tunnel syndrome moderate to severe  She also has a risk factor diabetes  She is right hand dominant registration clerk at the [employer] and wishes to have a right carpal tunnel released. Risk factors of complications include the time frame that she's had the carpal tunnel, the diabetes, the type of work. She is also made aware of possible tenderness of the scar from to 6 months and incomplete relief and return to full normal sensation  She will be scheduled for right carpal tunnel release  StaArther AbbottD 08/25/2016 9:55 AM

## 2016-09-10 ENCOUNTER — Encounter: Payer: Self-pay | Admitting: Orthopedic Surgery

## 2016-09-15 ENCOUNTER — Other Ambulatory Visit: Payer: Self-pay | Admitting: Family Medicine

## 2016-09-16 NOTE — Telephone Encounter (Signed)
Ok to refill??  Last office visit 06/10/2016.  Last refill 04/22/2016, #3 refills.

## 2016-09-16 NOTE — Telephone Encounter (Signed)
okay

## 2016-09-17 NOTE — Telephone Encounter (Signed)
Medication called to pharmacy. 

## 2016-09-23 ENCOUNTER — Encounter: Payer: Self-pay | Admitting: *Deleted

## 2016-09-23 NOTE — Progress Notes (Unsigned)
[facility] HAS APPROVED SURGERY FOR 10/01/16  AUTH # [id number]

## 2016-09-25 NOTE — Patient Instructions (Signed)
Sabrina Roberts  09/25/2016     @PREFPERIOPPHARMACY @   Your procedure is scheduled on  10/01/2016   Report to [facility] at  830  A.M.  Call this number if you have problems the morning of surgery:  [phone]   Remember:  Do not eat food or drink liquids after midnight.  Take these medicines the morning of surgery with A SIP OF WATER  Flexaril, neurontin, hydrocodone.   Do not wear jewelry, make-up or nail polish.  Do not wear lotions, powders, or perfumes, or deoderant.  Do not shave 48 hours prior to surgery.  Men may shave face and neck.  Do not bring valuables to the hospital.  [facility] is not responsible for any belongings or valuables.  Contacts, dentures or bridgework may not be worn into surgery.  Leave your suitcase in the car.  After surgery it may be brought to your room.  For patients admitted to the hospital, discharge time will be determined by your treatment team.  Patients discharged the day of surgery will not be allowed to drive home.   Name and phone number of your driver:   family Special instructions:  none  Please read over the following fact sheets that you were given. Anesthesia Post-op Instructions and Care and Recovery After Surgery       Carpal Tunnel Release Carpal tunnel release is a surgical procedure to relieve numbness and pain in your hand that are caused by carpal tunnel syndrome. Your carpal tunnel is a narrow, hollow space in your wrist. It passes between your wrist bones and a band of connective tissue (transverse carpal ligament). The nerve that supplies most of your hand (median nerve) passes through this space, and so do the connections between your fingers and the muscles of your arm (tendons). Carpal tunnel syndrome makes this space swell and become narrow, and this causes pain and numbness. In carpal tunnel release surgery, a surgeon cuts through the transverse carpal ligament to make more room in the carpal tunnel  space. You may have this surgery if other types of treatment have not worked. LET [facility] CARE PROVIDER KNOW ABOUT:  Any allergies you have.  All medicines you are taking, including vitamins, herbs, eye drops, creams, and over-the-counter medicines.  Previous problems you or members of your family have had with the use of anesthetics.  Any blood disorders you have.  Previous surgeries you have had.  Medical conditions you have. RISKS AND COMPLICATIONS Generally, this is a safe procedure. However, problems may occur, including:  Bleeding.  Infection.  Injury to the median nerve.  Need for additional surgery. BEFORE THE PROCEDURE  Ask your health care provider about:  Changing or stopping your regular medicines. This is especially important if you are taking diabetes medicines or blood thinners.  Taking medicines such as aspirin and ibuprofen. These medicines can thin your blood. Do not take these medicines before your procedure if your health care provider instructs you not to.  Do not eat or drink anything after midnight on the night before the procedure or as directed by your health care provider.  Plan to have someone take you home after the procedure. PROCEDURE  An IV tube may be inserted into a vein.  You will be given one of the following:  A medicine that numbs the wrist area (local anesthetic). You may also be given a medicine to make you relax (sedative).  A medicine that  makes you go to sleep (general anesthetic).  Your arm, hand, and wrist will be cleaned with a germ-killing solution (antiseptic).  Your surgeon will make a surgical cut (incision) over the palm side of your wrist. The surgeon will pull aside the skin of your wrist to expose the carpal tunnel space.  The surgeon will cut the transverse carpal ligament.  The edges of the incision will be closed with stitches (sutures) or staples.  A bandage (dressing) will be placed over your wrist and  wrapped around your hand and wrist. AFTER THE PROCEDURE  You may spend some time in a recovery area.  Your blood pressure, heart rate, breathing rate, and blood oxygen level will be monitored often until the medicines you were given have worn off.  You will likely have some pain. You will be given pain medicine.  You may need to wear a splint or a wrist brace over your dressing.   This information is not intended to replace advice given to you by your health care provider. Make sure you discuss any questions you have with your health care provider.   Document Released: 03/05/2004 Document Revised: 01/04/2015 Document Reviewed: 08/01/2014 Elsevier Interactive Patient Education 2016 [place] After Refer to this sheet in the next few weeks. These instructions provide you with information about caring for yourself after your procedure. Your health care provider may also give you more specific instructions. Your treatment has been planned according to current medical practices, but problems sometimes occur. Call your health care provider if you have any problems or questions after your procedure. WHAT TO EXPECT AFTER THE PROCEDURE After your procedure, it is typical to have the following:  Pain.  Numbness.  Tingling.  Swelling.  Stiffness.  Bruising. HOME CARE INSTRUCTIONS  Take medicines only as directed by your health care provider.  There are many different ways to close and cover an incision, including stitches (sutures), skin glue, and adhesive strips. Follow your health care provider's instructions about:  Incision care.  Bandage (dressing) changes and removal.  Incision closure removal.  Wear a splint or a brace as directed by your surgeon. You may need to do this for 2-3 weeks.  Keep your hand raised (elevated) above the level of your heart while you are resting. Move your fingers often.  Avoid activities that cause hand pain.  Ask  your surgeon when you can start to do all of your usual activities again, such as:  Driving.  Returning to work.  Bathing and swimming.  Keep all follow-up visits as directed by your health care provider. This is important. You may need physical therapy for several months to speed healing and regain movement. SEEK MEDICAL CARE IF:  You have drainage, redness, swelling, or pain at your incision site.  You have a fever.  You have chills.  Your pain medicine is not working.  Your symptoms do not go away after 2 months.  Your symptoms go away and then return. SEEK IMMEDIATE MEDICAL CARE IF:  You have pain or numbness that is getting worse.  Your fingers change color.  You are not able to move your fingers.   This information is not intended to replace advice given to you by your health care provider. Make sure you discuss any questions you have with your health care provider.   Document Released: 07/03/2005 Document Revised: 01/04/2015 Document Reviewed: 08/01/2014 Elsevier Interactive Patient Education 2016 [employer] PATIENT INSTRUCTIONS POST-ANESTHESIA  IMMEDIATELY FOLLOWING SURGERY:  Do not drive or operate machinery for the first twenty four hours after surgery.  Do not make any important decisions for twenty four hours after surgery or while taking narcotic pain medications or sedatives.  If you develop intractable nausea and vomiting or a severe headache please notify your doctor immediately.  FOLLOW-UP:  Please make an appointment with your surgeon as instructed. You do not need to follow up with anesthesia unless specifically instructed to do so.  WOUND CARE INSTRUCTIONS (if applicable):  Keep a dry clean dressing on the anesthesia/puncture wound site if there is drainage.  Once the wound has quit draining you may leave it open to air.  Generally you should leave the bandage intact for twenty four hours unless there is drainage.  If the epidural site drains for more  than 36-48 hours please call the anesthesia department.  QUESTIONS?:  Please feel free to call your physician or the hospital operator if you have any questions, and they will be happy to assist you.

## 2016-09-28 ENCOUNTER — Encounter (HOSPITAL_COMMUNITY)
Admission: RE | Admit: 2016-09-28 | Discharge: 2016-09-28 | Disposition: A | Discharge status: Home / Self Care | Payer: 59 | Source: Ambulatory Visit | Attending: Orthopedic Surgery | Admitting: Orthopedic Surgery

## 2016-09-28 ENCOUNTER — Encounter (HOSPITAL_COMMUNITY): Payer: Self-pay

## 2016-09-28 DIAGNOSIS — E669 Obesity, unspecified: Secondary | ICD-10-CM | POA: Diagnosis not present

## 2016-09-28 DIAGNOSIS — M1990 Unspecified osteoarthritis, unspecified site: Secondary | ICD-10-CM | POA: Diagnosis not present

## 2016-09-28 DIAGNOSIS — Z79899 Other long term (current) drug therapy: Secondary | ICD-10-CM | POA: Diagnosis not present

## 2016-09-28 DIAGNOSIS — Z7984 Long term (current) use of oral hypoglycemic drugs: Secondary | ICD-10-CM | POA: Diagnosis not present

## 2016-09-28 DIAGNOSIS — G4730 Sleep apnea, unspecified: Secondary | ICD-10-CM | POA: Diagnosis not present

## 2016-09-28 DIAGNOSIS — Z8249 Family history of ischemic heart disease and other diseases of the circulatory system: Secondary | ICD-10-CM | POA: Diagnosis not present

## 2016-09-28 DIAGNOSIS — E119 Type 2 diabetes mellitus without complications: Secondary | ICD-10-CM | POA: Diagnosis not present

## 2016-09-28 DIAGNOSIS — G5603 Carpal tunnel syndrome, bilateral upper limbs: Secondary | ICD-10-CM | POA: Diagnosis not present

## 2016-09-28 DIAGNOSIS — Z6841 Body Mass Index (BMI) 40.0 and over, adult: Secondary | ICD-10-CM | POA: Diagnosis not present

## 2016-09-28 DIAGNOSIS — E785 Hyperlipidemia, unspecified: Secondary | ICD-10-CM | POA: Diagnosis not present

## 2016-09-28 HISTORY — DX: Sleep apnea, unspecified: G47.30

## 2016-09-28 LAB — CBC WITH DIFFERENTIAL/PLATELET
Basophils Absolute: 0 10*3/uL (ref 0.0–0.1)
Basophils Relative: 0 %
Eosinophils Absolute: 0.2 10*3/uL (ref 0.0–0.7)
Eosinophils Relative: 3 %
HCT: 35.2 % — ABNORMAL LOW (ref 36.0–46.0)
Hemoglobin: 11.8 g/dL — ABNORMAL LOW (ref 12.0–15.0)
Lymphocytes Relative: 37 %
Lymphs Abs: 2.6 10*3/uL (ref 0.7–4.0)
MCH: 29.4 pg (ref 26.0–34.0)
MCHC: 33.5 g/dL (ref 30.0–36.0)
MCV: 87.6 fL (ref 78.0–100.0)
Monocytes Absolute: 0.6 10*3/uL (ref 0.1–1.0)
Monocytes Relative: 8 %
Neutro Abs: 3.7 10*3/uL (ref 1.7–7.7)
Neutrophils Relative %: 52 %
Platelets: 323 10*3/uL (ref 150–400)
RBC: 4.02 MIL/uL (ref 3.87–5.11)
RDW: 14 % (ref 11.5–15.5)
WBC: 7.1 10*3/uL (ref 4.0–10.5)

## 2016-09-28 LAB — BASIC METABOLIC PANEL
Anion gap: 5 (ref 5–15)
BUN: 7 mg/dL (ref 6–20)
CO2: 26 mmol/L (ref 22–32)
Calcium: 8.5 mg/dL — ABNORMAL LOW (ref 8.9–10.3)
Chloride: 109 mmol/L (ref 101–111)
Creatinine, Ser: 0.48 mg/dL (ref 0.44–1.00)
GFR calc Af Amer: >60 mL/min (ref >60)
GFR calc non Af Amer: >60 mL/min (ref >60)
Glucose, Bld: 104 mg/dL — ABNORMAL HIGH (ref 65–99)
Potassium: 3.7 mmol/L (ref 3.5–5.1)
Sodium: 140 mmol/L (ref 135–145)

## 2016-09-28 LAB — HCG, SERUM, QUALITATIVE: Preg, Serum: POSITIVE — AB

## 2016-09-30 ENCOUNTER — Other Ambulatory Visit (HOSPITAL_COMMUNITY)
Admission: RE | Admit: 2016-09-30 | Discharge: 2016-09-30 | Disposition: A | Discharge status: Home / Self Care | Payer: 59 | Source: Ambulatory Visit | Attending: Orthopedic Surgery | Admitting: Orthopedic Surgery

## 2016-09-30 DIAGNOSIS — G5603 Carpal tunnel syndrome, bilateral upper limbs: Secondary | ICD-10-CM | POA: Diagnosis not present

## 2016-09-30 DIAGNOSIS — Z01812 Encounter for preprocedural laboratory examination: Secondary | ICD-10-CM | POA: Insufficient documentation

## 2016-09-30 LAB — HCG, QUANTITATIVE, PREGNANCY: hCG, Beta Chain, Quant, S: 4 m[IU]/mL (ref <5)

## 2016-09-30 NOTE — H&P (Signed)
Chief Complaint  Patient presents with  . Carpal Tunnel      bilateral carpal tunnel    HPI  As noted by Dr [name]    HPI   She has bilateral carpal tunnel syndrome.  She has been seen by Dr. [name] and has positive nerve conduction studies for carpal tunnel.  She has nocturnal pain and numbness.  It is getting worse.  She is dropping things.  Nothing helps now.  She has no trauma.     I have talked to her about carpal tunnel surgery.  I will have her see Dr. [name] for this.  She is agreeable.   Review of Systems  HENT: Negative for congestion.   Cardiovascular: Negative for chest pain and leg swelling.  Endocrine: Negative for cold intolerance.  Musculoskeletal: Positive for arthralgias.  Allergic/Immunologic: Negative for environmental allergies.      ROS see above        Past Medical History:  Diagnosis Date  . Arthritis    . Diabetes mellitus    . Hyperlipidemia             Past Surgical History:  Procedure Laterality Date  . CESAREAN SECTION   2002  . CHOLECYSTECTOMY   dec 2012  . GASTRIC BYPASS   dec 2012  . ORTHOPEDIC SURGERY        Family History  Problem Relation Age of Onset  . Hypertension Mother    . Depression Mother    . Diabetes Mother    . Hypertension Father    . Hyperlipidemia Father    . Cancer Paternal Grandmother    . Cancer Paternal Grandfather          Social History  Substance Use Topics  . Smoking status: Never Smoker  . Smokeless tobacco: Never Used  . Alcohol use No      Current Outpatient Prescriptions:  .  Blood Glucose Monitoring Suppl (BLOOD GLUCOSE SYSTEM PAK) KIT, Please dispense based on patient and insurance preference. Use as directed to monitor FSBS 1x daily. Dx: E11.9., Disp: 1 each, Rfl: 1 .  cholecalciferol (VITAMIN D) 1000 UNITS tablet, Take 1,000 Units by mouth daily., Disp: , Rfl:  .  cyanocobalamin (,VITAMIN B-12,) 1000 MCG/ML injection, Inject 1 mL (1,000 mcg total) into the muscle every 30 (thirty)  days., Disp: 3 mL, Rfl: 3 .  Glucose Blood (BLOOD GLUCOSE TEST STRIPS) STRP, Please dispense based on patient and insurance preference. Use as directed to monitor FSBS 1x daily. Dx: E11.9., Disp: 100 each, Rfl: 3 .  HYDROcodone-acetaminophen (NORCO) 5-325 MG tablet, Take 1 tablet by mouth every 6 (six) hours as needed for moderate pain., Disp: 30 tablet, Rfl: 0 .  Lancet Devices MISC, Please dispense based on patient and insurance preference. Use as directed to monitor FSBS 1x daily. Dx: E11.9., Disp: 1 each, Rfl: 1 .  Lancets MISC, Please dispense based on patient and insurance preference. Use as directed to monitor FSBS 1x daily. Dx: E11.9., Disp: 100 each, Rfl: 3 .  metFORMIN (GLUCOPHAGE) 500 MG tablet, Take 1 tablet (500 mg total) by mouth daily with breakfast., Disp: 30 tablet, Rfl: 3 .  Multiple Vitamin (MULTIVITAMIN) tablet, Take 1 tablet by mouth 2 (two) times daily., Disp: , Rfl:  .  NEEDLE, DISP, 25 G 25G X 1" MISC, Use to inject Vit B12 IM Q month, Disp: 3 each, Rfl: 3 .  Syringe, Disposable, 3 ML MISC, Use to inject Vit B12 IM Q month, Disp: 3 each,  Rfl: 3 .  Vitamin D, Ergocalciferol, (DRISDOL) 50000 units CAPS capsule, Take (1) capsule by mouth every week x12 weeks, then stop., Disp: 12 capsule, Rfl: 0 .  zolpidem (AMBIEN) 10 MG tablet, Take 1 tablet (10 mg total) by mouth at bedtime as needed for sleep., Disp: 30 tablet, Rfl: 3 .  cyclobenzaprine (FLEXERIL) 10 MG tablet, TAKE ONE TABLET BY MOUTH THREE TIMES DAILY AS NEEDED FOR MUSCLE SPASMS (Patient not taking: Reported on 08/19/2016), Disp: 30 tablet, Rfl: 0 .  gabapentin (NEURONTIN) 300 MG capsule, Take 1 capsule (300 mg total) by mouth 2 (two) times daily. (Patient not taking: Reported on 08/19/2016), Disp: 60 capsule, Rfl: 3 .  predniSONE (DELTASONE) 10 MG tablet, Take 11m x 3 days,251mx 3 days, 1055m 3 days (Patient not taking: Reported on 08/19/2016), Disp: 21 tablet, Rfl: 0   BP (!) 153/93   Pulse 85   Ht 5' 4" (1.626 m)   Wt  253 lb (114.8 kg)   BMI 43.43 kg/m    Physical Exam  Constitutional: She is oriented to person, place, and time. She appears well-developed and well-nourished. No distress.  HENT:  Head: Normocephalic and atraumatic.  Cardiovascular: Normal rate and intact distal pulses.   Neurological: She is alert and oriented to person, place, and time. She has normal reflexes. She exhibits normal muscle tone. Coordination normal.  Skin: Skin is warm and dry. No rash noted. She is not diaphoretic. No erythema. No pallor.  Psychiatric: She has a normal mood and affect. Her behavior is normal. Judgment and thought content normal.      Ortho Exam Right and left hand and wrist exam. No swelling is noted there is tenderness over both carpal tunnels. She has maintained equal range of motion of both wrists and hands without any instability. Negative Watson test. She has weak grip strength in each hand right is worse than left. Skin clean dry and intact with warm with no nodules. Pulses are good temperature is normal radial pulses 2+ in each hand. Allen's test is normal.   Lymph nodes in the elbow area normal nonpalpable. She has sensory loss in the thumb index long and part of the ring finger and both right and left hand       ASSESSMENT: Nerve conduction study is positive for bilateral carpal tunnel syndrome moderate to severe   She also has a risk factor diabetes   She is right hand dominant registration clerk at the [employer] and wishes to have a right carpal tunnel released. Risk factors of complications include the time frame that she's had the carpal tunnel, the diabetes, the type of work. She is also made aware of possible tenderness of the scar from to 6 months and incomplete relief and return to full normal sensation   She will be scheduled for right carpal tunnel release   StaArther AbbottD 09/30/2016 12:25 PM

## 2016-10-01 ENCOUNTER — Ambulatory Visit (HOSPITAL_COMMUNITY): Payer: 59 | Admitting: Anesthesiology

## 2016-10-01 ENCOUNTER — Ambulatory Visit (HOSPITAL_COMMUNITY)
Admission: RE | Admit: 2016-10-01 | Discharge: 2016-10-01 | Disposition: A | Discharge status: Home / Self Care | Payer: 59 | Source: Ambulatory Visit | Attending: Orthopedic Surgery | Admitting: Orthopedic Surgery

## 2016-10-01 ENCOUNTER — Encounter (HOSPITAL_COMMUNITY)
Admission: RE | Disposition: A | Discharge status: Home / Self Care | Payer: Self-pay | Source: Ambulatory Visit | Attending: Orthopedic Surgery

## 2016-10-01 ENCOUNTER — Encounter (HOSPITAL_COMMUNITY): Payer: Self-pay | Admitting: *Deleted

## 2016-10-01 DIAGNOSIS — E669 Obesity, unspecified: Secondary | ICD-10-CM | POA: Diagnosis not present

## 2016-10-01 DIAGNOSIS — E785 Hyperlipidemia, unspecified: Secondary | ICD-10-CM | POA: Diagnosis not present

## 2016-10-01 DIAGNOSIS — Z79899 Other long term (current) drug therapy: Secondary | ICD-10-CM | POA: Insufficient documentation

## 2016-10-01 DIAGNOSIS — G5601 Carpal tunnel syndrome, right upper limb: Secondary | ICD-10-CM

## 2016-10-01 DIAGNOSIS — Z6841 Body Mass Index (BMI) 40.0 and over, adult: Secondary | ICD-10-CM | POA: Diagnosis not present

## 2016-10-01 DIAGNOSIS — Z7984 Long term (current) use of oral hypoglycemic drugs: Secondary | ICD-10-CM | POA: Insufficient documentation

## 2016-10-01 DIAGNOSIS — G4730 Sleep apnea, unspecified: Secondary | ICD-10-CM | POA: Diagnosis not present

## 2016-10-01 DIAGNOSIS — E119 Type 2 diabetes mellitus without complications: Secondary | ICD-10-CM | POA: Insufficient documentation

## 2016-10-01 DIAGNOSIS — M1990 Unspecified osteoarthritis, unspecified site: Secondary | ICD-10-CM | POA: Diagnosis not present

## 2016-10-01 DIAGNOSIS — G5603 Carpal tunnel syndrome, bilateral upper limbs: Secondary | ICD-10-CM | POA: Diagnosis not present

## 2016-10-01 DIAGNOSIS — Z8249 Family history of ischemic heart disease and other diseases of the circulatory system: Secondary | ICD-10-CM | POA: Diagnosis not present

## 2016-10-01 HISTORY — PX: CARPAL TUNNEL RELEASE: SHX101

## 2016-10-01 LAB — GLUCOSE, CAPILLARY
Glucose-Capillary: 103 mg/dL — ABNORMAL HIGH (ref 65–99)
Glucose-Capillary: 103 mg/dL — ABNORMAL HIGH (ref 65–99)

## 2016-10-01 SURGERY — CARPAL TUNNEL RELEASE
Anesthesia: Regional | Laterality: Right

## 2016-10-01 MED ORDER — CHLORHEXIDINE GLUCONATE 4 % EX LIQD: 60.0000 mL | Freq: Once | CUTANEOUS | Status: DC

## 2016-10-01 MED ORDER — FENTANYL CITRATE (PF) 100 MCG/2ML IJ SOLN
25.0000 ug | INTRAMUSCULAR | Status: DC | PRN
  Administered 2016-10-01: 25 ug via INTRAVENOUS

## 2016-10-01 MED ORDER — 0.9 % SODIUM CHLORIDE (POUR BTL) OPTIME
TOPICAL | Status: DC | PRN
  Administered 2016-10-01: 1000 mL

## 2016-10-01 MED ORDER — PROPOFOL 10 MG/ML IV BOLUS
INTRAVENOUS | Status: AC
  Filled 2016-10-01: qty 40

## 2016-10-01 MED ORDER — BUPIVACAINE HCL (PF) 0.5 % IJ SOLN
INTRAMUSCULAR | Status: DC | PRN
  Administered 2016-10-01: 10 mL

## 2016-10-01 MED ORDER — KETOROLAC TROMETHAMINE 30 MG/ML IJ SOLN
INTRAMUSCULAR | Status: AC
  Filled 2016-10-01: qty 1

## 2016-10-01 MED ORDER — LACTATED RINGERS IV SOLN
INTRAVENOUS | Status: DC
  Administered 2016-10-01: 07:00:00 via INTRAVENOUS
  Administered 2016-10-01: 1000 mL via INTRAVENOUS

## 2016-10-01 MED ORDER — FENTANYL CITRATE (PF) 100 MCG/2ML IJ SOLN
INTRAMUSCULAR | Status: AC
  Filled 2016-10-01: qty 2

## 2016-10-01 MED ORDER — BUPIVACAINE HCL (PF) 0.5 % IJ SOLN
INTRAMUSCULAR | Status: AC
  Filled 2016-10-01: qty 30

## 2016-10-01 MED ORDER — LIDOCAINE HCL (CARDIAC) 10 MG/ML IV SOLN
INTRAVENOUS | Status: DC | PRN
  Administered 2016-10-01: 30 mg via INTRAVENOUS

## 2016-10-01 MED ORDER — ONDANSETRON HCL 4 MG/2ML IJ SOLN
4.0000 mg | Freq: Once | INTRAMUSCULAR | Status: AC
  Administered 2016-10-01: 4 mg via INTRAVENOUS

## 2016-10-01 MED ORDER — MIDAZOLAM HCL 2 MG/2ML IJ SOLN
INTRAMUSCULAR | Status: AC
  Filled 2016-10-01: qty 2

## 2016-10-01 MED ORDER — LIDOCAINE HCL (PF) 1 % IJ SOLN
INTRAMUSCULAR | Status: AC
  Filled 2016-10-01: qty 5

## 2016-10-01 MED ORDER — CEFAZOLIN SODIUM-DEXTROSE 2-4 GM/100ML-% IV SOLN
2.0000 g | INTRAVENOUS | Status: AC
  Administered 2016-10-01: 2 g via INTRAVENOUS

## 2016-10-01 MED ORDER — HYDROCODONE-ACETAMINOPHEN 7.5-325 MG PO TABS
1.0000 | ORAL_TABLET | Freq: Once | ORAL | Status: AC
  Administered 2016-10-01: 1 via ORAL
  Filled 2016-10-01: qty 1

## 2016-10-01 MED ORDER — HYDROMORPHONE HCL 1 MG/ML IJ SOLN
0.2500 mg | INTRAMUSCULAR | Status: DC | PRN
  Administered 2016-10-01 (×3): 0.5 mg via INTRAVENOUS
  Filled 2016-10-01 (×2): qty 1

## 2016-10-01 MED ORDER — PROPOFOL 500 MG/50ML IV EMUL
INTRAVENOUS | Status: DC | PRN
  Administered 2016-10-01: 125 ug/kg/min via INTRAVENOUS

## 2016-10-01 MED ORDER — KETOROLAC TROMETHAMINE 30 MG/ML IJ SOLN
30.0000 mg | Freq: Once | INTRAMUSCULAR | Status: AC
  Administered 2016-10-01: 30 mg via INTRAVENOUS

## 2016-10-01 MED ORDER — MIDAZOLAM HCL 2 MG/2ML IJ SOLN
1.0000 mg | INTRAMUSCULAR | Status: DC | PRN
  Administered 2016-10-01: 2 mg via INTRAVENOUS

## 2016-10-01 MED ORDER — FENTANYL CITRATE (PF) 100 MCG/2ML IJ SOLN
INTRAMUSCULAR | Status: DC | PRN
  Administered 2016-10-01: 25 ug via INTRAVENOUS

## 2016-10-01 MED ORDER — CEFAZOLIN SODIUM-DEXTROSE 2-4 GM/100ML-% IV SOLN
INTRAVENOUS | Status: AC
  Filled 2016-10-01: qty 100

## 2016-10-01 MED ORDER — ONDANSETRON HCL 4 MG/2ML IJ SOLN
INTRAMUSCULAR | Status: AC
  Filled 2016-10-01: qty 2

## 2016-10-01 MED ORDER — HYDROCODONE-ACETAMINOPHEN 7.5-325 MG PO TABS: 1.0000 | ORAL_TABLET | ORAL | 0 refills | Status: DC | PRN

## 2016-10-01 SURGICAL SUPPLY — 42 items
BAG HAMPER (MISCELLANEOUS) ×3 IMPLANT
BANDAGE ELASTIC 3 LF NS (GAUZE/BANDAGES/DRESSINGS) ×3 IMPLANT
BANDAGE ESMARK 4X12 BL STRL LF (DISPOSABLE) ×1 IMPLANT
BLADE SURG 15 STRL LF DISP TIS (BLADE) ×1 IMPLANT
BLADE SURG 15 STRL SS (BLADE) ×3
BNDG CMPR 12X4 ELC STRL LF (DISPOSABLE) ×1
BNDG CMPR MED 5X3 ELC HKLP NS (GAUZE/BANDAGES/DRESSINGS) ×1
BNDG COHESIVE 4X5 TAN STRL (GAUZE/BANDAGES/DRESSINGS) ×3 IMPLANT
BNDG ESMARK 4X12 BLUE STRL LF (DISPOSABLE) ×3
BNDG GAUZE ELAST 4 BULKY (GAUZE/BANDAGES/DRESSINGS) ×2 IMPLANT
CHLORAPREP W/TINT 26ML (MISCELLANEOUS) ×3 IMPLANT
CLOTH BEACON ORANGE TIMEOUT ST (SAFETY) ×3 IMPLANT
COVER LIGHT HANDLE STERIS (MISCELLANEOUS) ×6 IMPLANT
CUFF TOURNIQUET SINGLE 18IN (TOURNIQUET CUFF) ×3 IMPLANT
DECANTER SPIKE VIAL GLASS SM (MISCELLANEOUS) ×3 IMPLANT
DRAPE PROXIMA HALF (DRAPES) ×3 IMPLANT
DRSG XEROFORM 1X8 (GAUZE/BANDAGES/DRESSINGS) ×2 IMPLANT
ELECT NDL TIP 2.8 STRL (NEEDLE) ×1 IMPLANT
ELECT NEEDLE TIP 2.8 STRL (NEEDLE) ×3 IMPLANT
ELECT REM PT RETURN 9FT ADLT (ELECTROSURGICAL) ×3
ELECTRODE REM PT RTRN 9FT ADLT (ELECTROSURGICAL) ×1 IMPLANT
GAUZE SPONGE 4X4 12PLY STRL (GAUZE/BANDAGES/DRESSINGS) ×2 IMPLANT
GLOVE BIOGEL PI IND STRL 7.0 (GLOVE) ×1 IMPLANT
GLOVE BIOGEL PI INDICATOR 7.0 (GLOVE) ×4
GLOVE SKINSENSE NS SZ8.0 LF (GLOVE) ×2
GLOVE SKINSENSE STRL SZ8.0 LF (GLOVE) ×1 IMPLANT
GLOVE SS N UNI LF 8.5 STRL (GLOVE) ×3 IMPLANT
GLOVE SURG SS PI 7.0 STRL IVOR (GLOVE) ×2 IMPLANT
GOWN STRL REUS W/ TWL LRG LVL3 (GOWN DISPOSABLE) ×1 IMPLANT
GOWN STRL REUS W/TWL LRG LVL3 (GOWN DISPOSABLE) ×3
GOWN STRL REUS W/TWL XL LVL3 (GOWN DISPOSABLE) ×3 IMPLANT
HAND ALUMI XLG (SOFTGOODS) ×3 IMPLANT
KIT ROOM TURNOVER APOR (KITS) ×3 IMPLANT
MANIFOLD NEPTUNE II (INSTRUMENTS) ×3 IMPLANT
NDL HYPO 21X1.5 SAFETY (NEEDLE) ×1 IMPLANT
NEEDLE HYPO 21X1.5 SAFETY (NEEDLE) ×3 IMPLANT
NS IRRIG 1000ML POUR BTL (IV SOLUTION) ×3 IMPLANT
PACK BASIC LIMB (CUSTOM PROCEDURE TRAY) ×3 IMPLANT
PAD ARMBOARD 7.5X6 YLW CONV (MISCELLANEOUS) ×3 IMPLANT
SET BASIN LINEN APH (SET/KITS/TRAYS/PACK) ×3 IMPLANT
SUT ETHILON 3 0 FSL (SUTURE) ×3 IMPLANT
SYR CONTROL 10ML LL (SYRINGE) ×3 IMPLANT

## 2016-10-01 NOTE — Interval H&P Note (Signed)
History and Physical Interval Note:  10/01/2016 7:20 AM BP 133/85   Pulse 71   Temp 98.3 F (36.8 C) (Oral)   Resp 18   Ht 5\' 4"  (1.626 m)   Wt 253 lb (114.8 kg)   SpO2 98%   BMI 43.43 kg/m  Surgical site clear BASILISA BOZARTH  has presented today for surgery, with the diagnosis of CARPAL TUNNEL SYNDROME RIGHT  The various methods of treatment have been discussed with the patient and family. After consideration of risks, benefits and other options for treatment, the patient has consented to  Procedure(s): RIGHT CARPAL TUNNEL RELEASE (Right) as a surgical intervention .  The patient's history has been reviewed, patient examined, no change in status, stable for surgery.  I have reviewed the patient's chart and labs.  Questions were answered to the patient's satisfaction.     Sabrina Roberts

## 2016-10-01 NOTE — Op Note (Signed)
10/01/2016  8:04 AM  PATIENT:  Sabrina Roberts  45 y.o. female  PRE-OPERATIVE DIAGNOSIS:  CARPAL TUNNEL SYNDROME RIGHT  POST-OPERATIVE DIAGNOSIS:  CARPAL TUNNEL SYNDROME RIGHT  PROCEDURE:  Procedure(s): RIGHT CARPAL TUNNEL RELEASE (Right)   Findings Tight carpal tunnel  Apple core shape Gray Adhesions moderate   SURGEON:  Surgeon(s) and Role:    * Carole Civil, MD - Primary  PHYSICIAN ASSISTANT:   ASSISTANTS: none   ANESTHESIA:   regional and general  EBL:  Total I/O In: 400 [I.V.:400] Out: -   BLOOD ADMINISTERED:none  DRAINS: none   LOCAL MEDICATIONS USED:  MARCAINE   0.5% plain  SPECIMEN:  No Specimen  DISPOSITION OF SPECIMEN:  N/A  COUNTS:  YES  TOURNIQUET:   Total Tourniquet Time Documented: Upper Arm (Right) - 28 minutes Total: Upper Arm (Right) - 28 minutes   DICTATION: .Viviann Spare Dictation  PLAN OF CARE: Discharge to home after PACU  PATIENT DISPOSITION:  PACU - hemodynamically stable.   Delay start of Pharmacological VTE agent (>24hrs) due to surgical blood loss or risk of bleeding: not applicable  Carpal tunnel release right wrist  Preop diagnosis carpal tunnel syndrome right wrist postop diagnosis same  Procedure open carpal tunnel release right wrist  Surgeon [name]  Anesthesia regional Bier block  Operative findings compression of the right median nerveoccupied lesions  Indications failure of conservative treatment to relieve pain and paresthesias and numbness and tingling of the right hand.  The patient was identified in the preop area we confirm the surgical site marked as right wrist. Chart update completed. Patient taken to surgery. She had 2 g of Ancef. After establishing a Bier block her arm was prepped with ChloraPrep.  Timeout executed completed and confirmed site.  A straight incision was made over the carpal tunnel in line with the radial border of the ring finger. Blunt dissection was carried out to find the distal  aspect of the carpal tunnel. A blunted judgment was passed beneath the carpal tunnel. Sharp incision was then used to release the transverse carpal ligament. The contents of the carpal tunnel were inspected. The median nerve was compressed with slight discoloration.  The wound was irrigated and then closed with 3-0 nylon suture. We injected 10 mL of plain Marcaine on the radial side of the incision  A sterile bandage was applied and the tourniquet was released the color of the hand and capillary refill were normal  The patient was taken to the recovery room in stable condition

## 2016-10-01 NOTE — Anesthesia Procedure Notes (Signed)
Anesthesia Regional Block:  Bier block (IV Regional)  Pre-Anesthetic Checklist: ,, timeout performed, Correct Patient, Correct Site, Correct Laterality, Correct Procedure,, site marked, surgical consent,, at surgeon's request  Laterality: Right     Needles:  Injection technique: Single-shot  Needle Type: Other      Needle Gauge: 22 and 22 G    Additional Needles: Bier block (IV Regional)  Nerve Stimulator or Paresthesia:   Additional Responses:  Pulse checked post tourniquet inflation. IV NSL discontinued post injection.  Narrative:   Performed by: Personally   Additional Notes: 30cc 0.5% lido injected

## 2016-10-01 NOTE — Transfer of Care (Signed)
Immediate Anesthesia Transfer of Care Note  Patient: Sabrina Roberts  Procedure(s) Performed: Procedure(s): RIGHT CARPAL TUNNEL RELEASE (Right)  Patient Location: PACU  Anesthesia Type:MAC  Level of Consciousness: awake, patient cooperative and responds to stimulation  Airway & Oxygen Therapy: Patient Spontanous Breathing and Patient connected to nasal cannula oxygen  Post-op Assessment: Report given to RN, Post -op Vital signs reviewed and stable and Patient moving all extremities  Post vital signs: Reviewed and stable  Last Vitals:  Vitals:   10/01/16 0720 10/01/16 0725  BP: 128/84 121/78  Pulse:    Resp: 18 12  Temp:      Last Pain:  Vitals:   10/01/16 0654  TempSrc: Oral  PainSc: 2       Patients Stated Pain Goal: 5 ([id number])  Complications: No apparent anesthesia complications

## 2016-10-01 NOTE — Anesthesia Preprocedure Evaluation (Signed)
Anesthesia Evaluation  Patient identified by MRN, date of birth, ID band Patient awake    Reviewed: Allergy & Precautions, NPO status , Patient's Chart, lab work & pertinent test results  Airway Mallampati: II  TM Distance: >3 FB     Dental  (+) Teeth Intact   Pulmonary sleep apnea ,    breath sounds clear to auscultation       Cardiovascular negative cardio ROS   Rhythm:Regular Rate:Normal     Neuro/Psych    GI/Hepatic Hx gastric bypass No reflux symptoms   Endo/Other  diabetes, Well Controlled, Type 2, Oral Hypoglycemic AgentsMorbid obesity  Renal/GU      Musculoskeletal   Abdominal (+) + obese,   Peds  Hematology   Anesthesia Other Findings   Reproductive/Obstetrics                             Anesthesia Physical Anesthesia Plan  ASA: II  Anesthesia Plan: Bier Block   Post-op Pain Management:    Induction: Intravenous  Airway Management Planned: Simple Face Mask  Additional Equipment:   Intra-op Plan:   Post-operative Plan:   Informed Consent: I have reviewed the patients History and Physical, chart, labs and discussed the procedure including the risks, benefits and alternatives for the proposed anesthesia with the patient or authorized representative who has indicated his/her understanding and acceptance.     Plan Discussed with:   Anesthesia Plan Comments:         Anesthesia Quick Evaluation

## 2016-10-01 NOTE — Discharge Instructions (Signed)
Carpal Tunnel Release, Care After °Refer to this sheet in the next few weeks. These instructions provide you with information about caring for yourself after your procedure. Your health care provider may also give you more specific instructions. Your treatment has been planned according to current medical practices, but problems sometimes occur. Call your health care provider if you have any problems or questions after your procedure. °WHAT TO EXPECT AFTER THE PROCEDURE °After your procedure, it is typical to have the following: °· Pain. °· Numbness. °· Tingling. °· Swelling. °· Stiffness. °· Bruising. °HOME CARE INSTRUCTIONS °· Take medicines only as directed by your health care provider. °· There are many different ways to close and cover an incision, including stitches (sutures), skin glue, and adhesive strips. Follow your health care provider's instructions about: °¨ Incision care. °¨ Bandage (dressing) changes and removal. °¨ Incision closure removal. °· Wear a splint or a brace as directed by your surgeon. You may need to do this for 2-3 weeks. °· Keep your hand raised (elevated) above the level of your heart while you are resting. Move your fingers often. °· Avoid activities that cause hand pain. °· Ask your surgeon when you can start to do all of your usual activities again, such as: °¨ Driving. °¨ Returning to work. °¨ Bathing and swimming. °· Keep all follow-up visits as directed by your health care provider. This is important. You may need physical therapy for several months to speed healing and regain movement. °SEEK MEDICAL CARE IF: °· You have drainage, redness, swelling, or pain at your incision site. °· You have a fever. °· You have chills. °· Your pain medicine is not working. °· Your symptoms do not go away after 2 months. °· Your symptoms go away and then return. °SEEK IMMEDIATE MEDICAL CARE IF: °· You have pain or numbness that is getting worse. °· Your fingers change color. °· You are not able  to move your fingers. °  °This information is not intended to replace advice given to you by your health care provider. Make sure you discuss any questions you have with your health care provider. °  °Document Released: 07/03/2005 Document Revised: 01/04/2015 Document Reviewed: 08/01/2014 °Elsevier Interactive Patient Education ©2016 [employer] Inc[employer] ° °

## 2016-10-01 NOTE — Anesthesia Postprocedure Evaluation (Signed)
Anesthesia Post Note  Patient: Sabrina Roberts  Procedure(s) Performed: Procedure(s) (LRB): RIGHT CARPAL TUNNEL RELEASE (Right)  Patient location during evaluation: PACU Anesthesia Type: MAC Level of consciousness: awake and alert, patient cooperative and responds to stimulation Pain management: pain level controlled Vital Signs Assessment: post-procedure vital signs reviewed and stable Respiratory status: spontaneous breathing, respiratory function stable and patient connected to nasal cannula oxygen Cardiovascular status: stable Anesthetic complications: no    Last Vitals:  Vitals:   10/01/16 0720 10/01/16 0725  BP: 128/84 121/78  Pulse:    Resp: 18 12  Temp:      Last Pain:  Vitals:   10/01/16 0654  TempSrc: Oral  PainSc: 2                  Bryann Mcnealy

## 2016-10-01 NOTE — Brief Op Note (Addendum)
10/01/2016  8:04 AM  PATIENT:  Sabrina Roberts  45 y.o. female  PRE-OPERATIVE DIAGNOSIS:  CARPAL TUNNEL SYNDROME RIGHT  POST-OPERATIVE DIAGNOSIS:  CARPAL TUNNEL SYNDROME RIGHT  PROCEDURE:  Procedure(s): RIGHT CARPAL TUNNEL RELEASE (Right)   Findings Tight carpal tunnel  Apple core shape Gray Adhesions moderate   SURGEON:  Surgeon(s) and Role:    * Carole Civil, MD - Primary  PHYSICIAN ASSISTANT:   ASSISTANTS: none   ANESTHESIA:   regional and general  EBL:  Total I/O In: 400 [I.V.:400] Out: -   BLOOD ADMINISTERED:none  DRAINS: none   LOCAL MEDICATIONS USED:  MARCAINE   0.5% plain  SPECIMEN:  No Specimen  DISPOSITION OF SPECIMEN:  N/A  COUNTS:  YES  TOURNIQUET:   Total Tourniquet Time Documented: Upper Arm (Right) - 28 minutes Total: Upper Arm (Right) - 28 minutes   DICTATION: .Viviann Spare Dictation  PLAN OF CARE: Discharge to home after PACU  PATIENT DISPOSITION:  PACU - hemodynamically stable.   Delay start of Pharmacological VTE agent (>24hrs) due to surgical blood loss or risk of bleeding: not applicable  Carpal tunnel release right wrist  Preop diagnosis carpal tunnel syndrome right wrist postop diagnosis same  Procedure open carpal tunnel release right wrist  Surgeon [name]  Anesthesia regional Bier block  Operative findings compression of the right median nerveoccupied lesions  Indications failure of conservative treatment to relieve pain and paresthesias and numbness and tingling of the right hand.  The patient was identified in the preop area we confirm the surgical site marked as right wrist. Chart update completed. Patient taken to surgery. She had 2 g of Ancef. After establishing a Bier block her arm was prepped with ChloraPrep.  Timeout executed completed and confirmed site.  A straight incision was made over the carpal tunnel in line with the radial border of the ring finger. Blunt dissection was carried out to find the distal  aspect of the carpal tunnel. A blunted judgment was passed beneath the carpal tunnel. Sharp incision was then used to release the transverse carpal ligament. The contents of the carpal tunnel were inspected. The median nerve was compressed with slight discoloration.  The wound was irrigated and then closed with 3-0 nylon suture. We injected 10 mL of plain Marcaine on the radial side of the incision  A sterile bandage was applied and the tourniquet was released the color of the hand and capillary refill were normal  The patient was taken to the recovery room in stable condition

## 2016-10-02 ENCOUNTER — Emergency Department (HOSPITAL_COMMUNITY)
Admission: EM | Admit: 2016-10-02 | Discharge: 2016-10-02 | Disposition: A | Discharge status: Home / Self Care | Payer: 59 | Attending: Emergency Medicine | Admitting: Emergency Medicine

## 2016-10-02 ENCOUNTER — Emergency Department (HOSPITAL_COMMUNITY): Payer: 59

## 2016-10-02 ENCOUNTER — Encounter (HOSPITAL_COMMUNITY): Payer: Self-pay

## 2016-10-02 DIAGNOSIS — G8918 Other acute postprocedural pain: Secondary | ICD-10-CM | POA: Diagnosis not present

## 2016-10-02 DIAGNOSIS — W010XXA Fall on same level from slipping, tripping and stumbling without subsequent striking against object, initial encounter: Secondary | ICD-10-CM | POA: Diagnosis not present

## 2016-10-02 DIAGNOSIS — Y92009 Unspecified place in unspecified non-institutional (private) residence as the place of occurrence of the external cause: Secondary | ICD-10-CM

## 2016-10-02 DIAGNOSIS — Y92008 Other place in unspecified non-institutional (private) residence as the place of occurrence of the external cause: Secondary | ICD-10-CM | POA: Insufficient documentation

## 2016-10-02 DIAGNOSIS — Y9301 Activity, walking, marching and hiking: Secondary | ICD-10-CM | POA: Insufficient documentation

## 2016-10-02 DIAGNOSIS — Y999 Unspecified external cause status: Secondary | ICD-10-CM | POA: Diagnosis not present

## 2016-10-02 DIAGNOSIS — Z7984 Long term (current) use of oral hypoglycemic drugs: Secondary | ICD-10-CM | POA: Diagnosis not present

## 2016-10-02 DIAGNOSIS — E119 Type 2 diabetes mellitus without complications: Secondary | ICD-10-CM | POA: Insufficient documentation

## 2016-10-02 DIAGNOSIS — M25531 Pain in right wrist: Secondary | ICD-10-CM | POA: Insufficient documentation

## 2016-10-02 DIAGNOSIS — S6991XA Unspecified injury of right wrist, hand and finger(s), initial encounter: Secondary | ICD-10-CM | POA: Diagnosis not present

## 2016-10-02 DIAGNOSIS — W19XXXA Unspecified fall, initial encounter: Secondary | ICD-10-CM

## 2016-10-02 MED ORDER — KETOROLAC TROMETHAMINE 60 MG/2ML IM SOLN
60.0000 mg | Freq: Once | INTRAMUSCULAR | Status: AC
  Administered 2016-10-02: 60 mg via INTRAMUSCULAR
  Filled 2016-10-02: qty 2

## 2016-10-02 NOTE — Discharge Instructions (Signed)
Elevate your hand. Call Dr Ruthe Mannan office this morning to let him know you fell and your wrist is hurting more. He may want to recheck you today.

## 2016-10-02 NOTE — ED Provider Notes (Signed)
[facility] DEPT Provider Note   CSN: [id number] Arrival date & time: 10/02/16  0455  Time seen 05:10 AM   History   Chief Complaint Chief Complaint  Patient presents with  . Fall    wrist injury    HPI Sabrina Roberts is a 45 y.o. female.  HPI patient reports she had carpal tunnel release performed yesterday morning. She states this morning she had gotten up to use the bathroom and she sat in the living room for few minutes to watch TV with her husband. He states he helped her get up and she had some dizziness and as she turned to go down the hall to go back to bed she slipped and fell she states when she fell her right arm was outstretched and she fell onto her right wrist. She states she felt a pulling sensation and increasing pain in her right wrist. She has some underlying numbness of her right thumb index and middle fingers and she states they are more numb. She denies any other injury, she also denies hitting her head or having loss of consciousness. They state she fell around 3:30 AM. Patient states she is right-handed. Her next recheck with Dr. [name] is on the 11th.  PCP Dr [facility]uel[facility] Dr [name]  Past Medical History:  Diagnosis Date  . Arthritis   . Diabetes mellitus   . Hyperlipidemia   . Sleep apnea     Patient Active Problem List   Diagnosis Date Noted  . Carpal tunnel syndrome of right wrist   . Carpal tunnel syndrome 04/20/2016  . Borderline abnormal TFTs 10/29/2014  . Irregular menses 08/01/2013  . Pain in joint, ankle and foot 08/01/2013  . Stress reaction 10/14/2012  . Insomnia 10/14/2012  . Gynecological examination 05/03/2012  . Shoulder pain 05/03/2012  . DM (diabetes mellitus) ([place]) 02/28/2012  . Hyperlipidemia 02/28/2012  . Morbid obesity ([place]) 02/28/2012  . Thyroid nodule 02/28/2012    Past Surgical History:  Procedure Laterality Date  . CESAREAN SECTION  2002  . CHOLECYSTECTOMY  dec 2012  . GASTRIC BYPASS  dec 2012  .  ORTHOPEDIC SURGERY      OB History    No data available       Home Medications    Prior to Admission medications   Medication Sig Start Date End Date Taking? Authorizing Provider  HYDROcodone-acetaminophen (NORCO) 7.5-325 MG tablet Take 1 tablet by mouth every 4 (four) hours as needed for moderate pain. 10/01/16  Yes Carole Civil, MD  Blood Glucose Monitoring Suppl (BLOOD GLUCOSE SYSTEM PAK) KIT Please dispense based on patient and insurance preference. Use as directed to monitor FSBS 1x daily. Dx: E11.9. 04/22/16   Alycia Rossetti, MD  cholecalciferol (VITAMIN D) 1000 UNITS tablet Take 1,000 Units by mouth daily.    Historical Provider, MD  cyanocobalamin (,VITAMIN B-12,) 1000 MCG/ML injection Inject 1 mL (1,000 mcg total) into the muscle every 30 (thirty) days. 04/22/16   Alycia Rossetti, MD  gabapentin (NEURONTIN) 300 MG capsule Take 1 capsule (300 mg total) by mouth 2 (two) times daily. 04/20/16   Alycia Rossetti, MD  Glucose Blood (BLOOD GLUCOSE TEST STRIPS) STRP Please dispense based on patient and insurance preference. Use as directed to monitor FSBS 1x daily. Dx: E11.9. 04/22/16   Alycia Rossetti, MD  Lancet Devices MISC Please dispense based on patient and insurance preference. Use as directed to monitor FSBS 1x daily. Dx: E11.9. 04/22/16   Alycia Rossetti, MD  Lancets MISC Please dispense based on patient and insurance preference. Use as directed to monitor FSBS 1x daily. Dx: E11.9. 04/22/16   Alycia Rossetti, MD  metFORMIN (GLUCOPHAGE) 500 MG tablet Take 1 tablet (500 mg total) by mouth daily with breakfast. 04/22/16   Alycia Rossetti, MD  Multiple Vitamin (MULTIVITAMIN) tablet Take 1 tablet by mouth daily.     Historical Provider, MD  NEEDLE, DISP, 25 G 25G X 1" MISC Use to inject Vit B12 IM Q month 04/22/16   Alycia Rossetti, MD  Syringe, Disposable, 3 ML MISC Use to inject Vit B12 IM Q month 04/22/16   Alycia Rossetti, MD  Vitamin D, Ergocalciferol, (DRISDOL) 50000 units  CAPS capsule Take (1) capsule by mouth every week x12 weeks, then stop. 04/22/16   Alycia Rossetti, MD  zolpidem (AMBIEN) 10 MG tablet TAKE ONE TABLET BY MOUTH AT BEDTIME AS NEEDED FOR SLEEP 09/17/16   Alycia Rossetti, MD    Family History Family History  Problem Relation Age of Onset  . Hypertension Mother   . Depression Mother   . Diabetes Mother   . Hypertension Father   . Hyperlipidemia Father   . Cancer Paternal Grandmother   . Cancer Paternal Grandfather     Social History Social History  Substance Use Topics  . Smoking status: Never Smoker  . Smokeless tobacco: Never Used  . Alcohol use No  lives at home Lives with spouse   Allergies   Actos [pioglitazone hydrochloride] and Latex   Review of Systems Review of Systems  All other systems reviewed and are negative.    Physical Exam Updated Vital Signs BP 125/66 (BP Location: Left Arm)   Pulse 63   Temp 98.3 F (36.8 C) (Oral)   Resp 20   Ht '5\' 4"'  (1.626 m)   Wt 253 lb (114.8 kg)   SpO2 100%   BMI 43.43 kg/m   Vital signs normal    Physical Exam  Constitutional: She is oriented to person, place, and time. She appears well-developed and well-nourished.  Non-toxic appearance. She does not appear ill. No distress.  HENT:  Head: Normocephalic and atraumatic.  Right Ear: External ear normal.  Left Ear: External ear normal.  Nose: Nose normal. No mucosal edema or rhinorrhea.  Mouth/Throat: Mucous membranes are normal. No dental abscesses or uvula swelling.  Eyes: Conjunctivae and EOM are normal.  Neck: Normal range of motion and full passive range of motion without pain.  Cardiovascular: Normal rate.   Pulmonary/Chest: Effort normal. No respiratory distress. She has no rhonchi. She exhibits no crepitus.  Abdominal: Soft. Normal appearance.  Musculoskeletal: Normal range of motion. She exhibits no edema or tenderness.  Moves all extremities well.  The dressing was removed from her right wrist. Her  sutures are all intact. There is no fresh bleeding. There is very minimal amount of dark blood on her dressing. She has some mild swelling in the area of the carpal tunnel. This would be expected after surgery. She complains of decreased sensation of her right thumb index and middle fingers. She has good pulses and capillary refill. Her fingers are warm, and are normal color. She has good range of motion of her fingers.  Neurological: She is alert and oriented to person, place, and time. She has normal strength. No cranial nerve deficit.  Skin: Skin is warm, dry and intact. No rash noted. No erythema. No pallor.  Psychiatric: She has a normal mood and affect. Her speech  is normal and behavior is normal. Her mood appears not anxious.  Nursing note and vitals reviewed.      ED Treatments / Results  Labs (all labs ordered are listed, but only abnormal results are displayed) Labs Reviewed - No data to display    Radiology Dg Wrist Complete Right  Result Date: 10/02/2016 CLINICAL DATA:  Status post RIGHT carpal tunnel surgery 1 day ago, fell 45 minutes ago landing on RIGHT wrist. EXAM: RIGHT WRIST - COMPLETE 3+ VIEW COMPARISON:  None. FINDINGS: There is no evidence of fracture or dislocation. There is no evidence of arthropathy or other focal bone abnormality. Mild wrist soft tissue swelling with small amount of presumably postoperative gas within the anterior wrist. No radiopaque foreign bodies. Spot splint in place. IMPRESSION: No acute osseous process. Recent postoperative changes of the wrist. Electronically Signed   By: Elon Alas M.D.   On: 10/02/2016 06:07    Procedures Procedures (including critical care time)  Medications Ordered in ED Medications  ketorolac (TORADOL) injection 60 mg (60 mg Intramuscular Given 10/02/16 0540)     Initial Impression / Assessment and Plan / ED Course  I have reviewed the triage vital signs and the nursing notes.  Pertinent labs & imaging  results that were available during my care of the patient were reviewed by me and considered in my medical decision making (see chart for details).  Clinical Course   Patient's wound was undressed so I could see her surgical site. The surgical site looks intact. The wound was redressed. She was given Toradol IM for pain. X-ray was obtained due to the mechanisms of injury to make sure there is no underlying wrist fracture from her fall.  06:15 AM pt was given her xray results, advised to call Dr Ruthe Mannan office this morning to let him know about her fall and increasing pain and numbness.   Review of the [place] shows patient got #30 hydrocodone 5/325 filled on October 2 from her PCP and 30 hydrocodone 7.5/325 on October 5 from her orthopedist. She gets zolpidem monthly from her PCP. She also got #30 hydrocodone 5/325 on July 28 and June 17 from her PCP.  Final Clinical Impressions(s) / ED Diagnoses   Final diagnoses:  Fall at home, initial encounter  Post-op pain  Wrist pain, right    Plan discharge  Rolland Porter, MD, Barbette Or, MD 10/02/16 [phone]

## 2016-10-02 NOTE — ED Triage Notes (Signed)
Pt had carpal tunnel surgery Thursday morning, states she got up and was walking down the hall at home approx 45 mins ago and fell, landed on her right wrist that had surgery.

## 2016-10-05 ENCOUNTER — Telehealth: Payer: Self-pay | Admitting: Orthopedic Surgery

## 2016-10-05 NOTE — Telephone Encounter (Signed)
Patient left message saying that she had CT surgery on Thursday. When she went home she fell directly on her hand . She went back to the ER, they checked it and said it was ok, then rewrapped it. She just wanted to make Dr. [name] aware.

## 2016-10-05 NOTE — Telephone Encounter (Signed)
FYI

## 2016-10-06 ENCOUNTER — Encounter (HOSPITAL_COMMUNITY): Payer: Self-pay | Admitting: Orthopedic Surgery

## 2016-10-07 ENCOUNTER — Ambulatory Visit (INDEPENDENT_AMBULATORY_CARE_PROVIDER_SITE_OTHER): Payer: 59 | Admitting: Orthopedic Surgery

## 2016-10-07 ENCOUNTER — Encounter: Payer: Self-pay | Admitting: Orthopedic Surgery

## 2016-10-07 VITALS — BP 144/91 | HR 84 | Wt 253.0 lb

## 2016-10-07 DIAGNOSIS — G5601 Carpal tunnel syndrome, right upper limb: Secondary | ICD-10-CM

## 2016-10-07 DIAGNOSIS — Z4889 Encounter for other specified surgical aftercare: Secondary | ICD-10-CM

## 2016-10-07 MED ORDER — HYDROCODONE-ACETAMINOPHEN 7.5-325 MG PO TABS: 1.0000 | ORAL_TABLET | ORAL | 0 refills | Status: DC | PRN

## 2016-10-07 MED ORDER — PROMETHAZINE HCL 12.5 MG PO TABS: 12.5000 mg | ORAL_TABLET | ORAL | 2 refills | Status: DC | PRN

## 2016-10-07 NOTE — Patient Instructions (Signed)
Hand exercises    

## 2016-10-07 NOTE — Progress Notes (Signed)
Patient ID: Sabrina Roberts, female   DOB: 17-Feb-1971, 45 y.o.   MRN: [id number]  Post op visit   Chief Complaint  Patient presents with  . Follow-up    POST OP 1 RT CTR, DOS 10/01/16    BP (!) 144/91   Pulse 84   Wt 253 lb (114.8 kg)   BMI 43.43 kg/m

## 2016-10-08 ENCOUNTER — Encounter: Payer: Self-pay | Admitting: Orthopedic Surgery

## 2016-10-13 ENCOUNTER — Encounter: Payer: Self-pay | Admitting: Orthopedic Surgery

## 2016-10-13 ENCOUNTER — Ambulatory Visit (INDEPENDENT_AMBULATORY_CARE_PROVIDER_SITE_OTHER): Payer: 59 | Admitting: Orthopedic Surgery

## 2016-10-13 DIAGNOSIS — Z4889 Encounter for other specified surgical aftercare: Secondary | ICD-10-CM

## 2016-10-13 DIAGNOSIS — G5603 Carpal tunnel syndrome, bilateral upper limbs: Secondary | ICD-10-CM

## 2016-10-13 DIAGNOSIS — G5601 Carpal tunnel syndrome, right upper limb: Secondary | ICD-10-CM

## 2016-10-13 NOTE — Patient Instructions (Signed)
Scheduling carpal tunnel release  Note for work needs to be extended from today through 4 additional weeks for left carpal tunnel release which needs to be done and is medically necessary

## 2016-10-13 NOTE — Progress Notes (Signed)
Patient ID: Sabrina Roberts, female   DOB: [birth year], 45 y.o.   MRN: [id number]  Chief Complaint  Patient presents with  . Follow-up    RIGHT CTR, SUTURE REMOVAL, DOS 10/01/16    HPI Sabrina Roberts is a 45 y.o. female.   HPI  Suture removal right carpal tunnel just has some numbness residual in the tips of the long and small finger. Most of the preop numbness has resolved  Review of Systems Review of Systems   Physical Exam There were no vitals taken for this visit.   Physical Exam  Left carpal tunnel syndrome. The nerve distribution decreased sensation. Mild weakness.  Patient requests left carpal tunnel release to be done in 2 weeks

## 2016-10-14 ENCOUNTER — Encounter: Payer: Self-pay | Admitting: *Deleted

## 2016-10-14 NOTE — Progress Notes (Unsigned)
[employer] APPROVED LEFT CARPAL TUNNEL RELEASE SCHEDULED 10/22/16  AUTH [id number]

## 2016-10-14 NOTE — Addendum Note (Signed)
Addended by: Baldomero Lamy B on: 10/14/2016 03:56 PM   Modules accepted: Orders, SmartSet

## 2016-10-16 NOTE — Patient Instructions (Signed)
Sabrina Roberts  10/16/2016     @PREFPERIOPPHARMACY @   Your procedure is scheduled on  10/22/2016   Report to [facility] at  1215  P.M.  Call this number if you have problems the morning of surgery:  [phone]   Remember:  Do not eat food or drink liquids after midnight.  Take these medicines the morning of surgery with A SIP OF WATER  Flexaril, neurontin, norco, phenergan.   Do not wear jewelry, make-up or nail polish.  Do not wear lotions, powders, or perfumes, or deoderant.  Do not shave 48 hours prior to surgery.  Men may shave face and neck.  Do not bring valuables to the hospital.  [facility] is not responsible for any belongings or valuables.  Contacts, dentures or bridgework may not be worn into surgery.  Leave your suitcase in the car.  After surgery it may be brought to your room.  For patients admitted to the hospital, discharge time will be determined by your treatment team.  Patients discharged the day of surgery will not be allowed to drive home.   Name and phone number of your driver:   family Special instructions:  none  Please read over the following fact sheets that you were given. Anesthesia Post-op Instructions and Care and Recovery After Surgery       Carpal Tunnel Release Carpal tunnel release is a surgical procedure to relieve numbness and pain in your hand that are caused by carpal tunnel syndrome. Your carpal tunnel is a narrow, hollow space in your wrist. It passes between your wrist bones and a band of connective tissue (transverse carpal ligament). The nerve that supplies most of your hand (median nerve) passes through this space, and so do the connections between your fingers and the muscles of your arm (tendons). Carpal tunnel syndrome makes this space swell and become narrow, and this causes pain and numbness. In carpal tunnel release surgery, a surgeon cuts through the transverse carpal ligament to make more room in the carpal  tunnel space. You may have this surgery if other types of treatment have not worked. LET [facility] CARE PROVIDER KNOW ABOUT:  Any allergies you have.  All medicines you are taking, including vitamins, herbs, eye drops, creams, and over-the-counter medicines.  Previous problems you or members of your family have had with the use of anesthetics.  Any blood disorders you have.  Previous surgeries you have had.  Medical conditions you have. RISKS AND COMPLICATIONS Generally, this is a safe procedure. However, problems may occur, including:  Bleeding.  Infection.  Injury to the median nerve.  Need for additional surgery. BEFORE THE PROCEDURE  Ask your health care provider about:  Changing or stopping your regular medicines. This is especially important if you are taking diabetes medicines or blood thinners.  Taking medicines such as aspirin and ibuprofen. These medicines can thin your blood. Do not take these medicines before your procedure if your health care provider instructs you not to.  Do not eat or drink anything after midnight on the night before the procedure or as directed by your health care provider.  Plan to have someone take you home after the procedure. PROCEDURE  An IV tube may be inserted into a vein.  You will be given one of the following:  A medicine that numbs the wrist area (local anesthetic). You may also be given a medicine to make you relax (sedative).  A medicine  that makes you go to sleep (general anesthetic).  Your arm, hand, and wrist will be cleaned with a germ-killing solution (antiseptic).  Your surgeon will make a surgical cut (incision) over the palm side of your wrist. The surgeon will pull aside the skin of your wrist to expose the carpal tunnel space.  The surgeon will cut the transverse carpal ligament.  The edges of the incision will be closed with stitches (sutures) or staples.  A bandage (dressing) will be placed over your  wrist and wrapped around your hand and wrist. AFTER THE PROCEDURE  You may spend some time in a recovery area.  Your blood pressure, heart rate, breathing rate, and blood oxygen level will be monitored often until the medicines you were given have worn off.  You will likely have some pain. You will be given pain medicine.  You may need to wear a splint or a wrist brace over your dressing.   This information is not intended to replace advice given to you by your health care provider. Make sure you discuss any questions you have with your health care provider.   Document Released: 03/05/2004 Document Revised: 01/04/2015 Document Reviewed: 08/01/2014 Elsevier Interactive Patient Education 2016 [place] After Refer to this sheet in the next few weeks. These instructions provide you with information about caring for yourself after your procedure. Your health care provider may also give you more specific instructions. Your treatment has been planned according to current medical practices, but problems sometimes occur. Call your health care provider if you have any problems or questions after your procedure. WHAT TO EXPECT AFTER THE PROCEDURE After your procedure, it is typical to have the following:  Pain.  Numbness.  Tingling.  Swelling.  Stiffness.  Bruising. HOME CARE INSTRUCTIONS  Take medicines only as directed by your health care provider.  There are many different ways to close and cover an incision, including stitches (sutures), skin glue, and adhesive strips. Follow your health care provider's instructions about:  Incision care.  Bandage (dressing) changes and removal.  Incision closure removal.  Wear a splint or a brace as directed by your surgeon. You may need to do this for 2-3 weeks.  Keep your hand raised (elevated) above the level of your heart while you are resting. Move your fingers often.  Avoid activities that cause hand  pain.  Ask your surgeon when you can start to do all of your usual activities again, such as:  Driving.  Returning to work.  Bathing and swimming.  Keep all follow-up visits as directed by your health care provider. This is important. You may need physical therapy for several months to speed healing and regain movement. SEEK MEDICAL CARE IF:  You have drainage, redness, swelling, or pain at your incision site.  You have a fever.  You have chills.  Your pain medicine is not working.  Your symptoms do not go away after 2 months.  Your symptoms go away and then return. SEEK IMMEDIATE MEDICAL CARE IF:  You have pain or numbness that is getting worse.  Your fingers change color.  You are not able to move your fingers.   This information is not intended to replace advice given to you by your health care provider. Make sure you discuss any questions you have with your health care provider.   Document Released: 07/03/2005 Document Revised: 01/04/2015 Document Reviewed: 08/01/2014 Elsevier Interactive Patient Education 2016 [employer] PATIENT INSTRUCTIONS POST-ANESTHESIA  IMMEDIATELY FOLLOWING SURGERY:  Do not drive or operate machinery for the first twenty four hours after surgery.  Do not make any important decisions for twenty four hours after surgery or while taking narcotic pain medications or sedatives.  If you develop intractable nausea and vomiting or a severe headache please notify your doctor immediately.  FOLLOW-UP:  Please make an appointment with your surgeon as instructed. You do not need to follow up with anesthesia unless specifically instructed to do so.  WOUND CARE INSTRUCTIONS (if applicable):  Keep a dry clean dressing on the anesthesia/puncture wound site if there is drainage.  Once the wound has quit draining you may leave it open to air.  Generally you should leave the bandage intact for twenty four hours unless there is drainage.  If the epidural site  drains for more than 36-48 hours please call the anesthesia department.  QUESTIONS?:  Please feel free to call your physician or the hospital operator if you have any questions, and they will be happy to assist you.

## 2016-10-19 ENCOUNTER — Encounter (HOSPITAL_COMMUNITY)
Admission: RE | Admit: 2016-10-19 | Discharge: 2016-10-19 | Disposition: A | Discharge status: Home / Self Care | Payer: 59 | Source: Ambulatory Visit | Attending: Orthopedic Surgery | Admitting: Orthopedic Surgery

## 2016-10-20 ENCOUNTER — Encounter (HOSPITAL_COMMUNITY): Payer: Self-pay

## 2016-10-20 ENCOUNTER — Encounter (HOSPITAL_COMMUNITY)
Admission: RE | Admit: 2016-10-20 | Discharge: 2016-10-20 | Disposition: A | Discharge status: Home / Self Care | Payer: 59 | Source: Ambulatory Visit | Attending: Orthopedic Surgery | Admitting: Orthopedic Surgery

## 2016-10-20 DIAGNOSIS — G5602 Carpal tunnel syndrome, left upper limb: Secondary | ICD-10-CM | POA: Diagnosis present

## 2016-10-20 DIAGNOSIS — G4730 Sleep apnea, unspecified: Secondary | ICD-10-CM | POA: Diagnosis not present

## 2016-10-20 DIAGNOSIS — Z01818 Encounter for other preprocedural examination: Secondary | ICD-10-CM | POA: Diagnosis not present

## 2016-10-20 DIAGNOSIS — M1990 Unspecified osteoarthritis, unspecified site: Secondary | ICD-10-CM | POA: Diagnosis not present

## 2016-10-20 DIAGNOSIS — E119 Type 2 diabetes mellitus without complications: Secondary | ICD-10-CM | POA: Diagnosis not present

## 2016-10-20 DIAGNOSIS — G5603 Carpal tunnel syndrome, bilateral upper limbs: Secondary | ICD-10-CM | POA: Diagnosis not present

## 2016-10-20 DIAGNOSIS — Z6841 Body Mass Index (BMI) 40.0 and over, adult: Secondary | ICD-10-CM | POA: Diagnosis not present

## 2016-10-20 DIAGNOSIS — Z9884 Bariatric surgery status: Secondary | ICD-10-CM | POA: Diagnosis not present

## 2016-10-20 DIAGNOSIS — Z7984 Long term (current) use of oral hypoglycemic drugs: Secondary | ICD-10-CM | POA: Diagnosis not present

## 2016-10-20 DIAGNOSIS — E785 Hyperlipidemia, unspecified: Secondary | ICD-10-CM | POA: Diagnosis not present

## 2016-10-20 HISTORY — DX: Anxiety disorder, unspecified: F41.9

## 2016-10-20 LAB — HCG, SERUM, QUALITATIVE: Preg, Serum: NEGATIVE

## 2016-10-21 NOTE — H&P (Signed)
cc LEFT HAND PAIN NUMBNESS TINGLING   HPI   She has bilateral carpal tunnel syndrome.  She has been seen by Dr. [name] and has positive nerve conduction studies for carpal tunnel.  She has nocturnal pain and numbness.  It is getting worse.  She is dropping things.  Nothing helps now.  She has no trauma.     I have talked to her about carpal tunnel surgery.  I will have her see Dr. [name] for this.  She is agreeable.   Review of Systems  HENT: Negative for congestion.   Cardiovascular: Negative for chest pain and leg swelling.  Endocrine: Negative for cold intolerance.  Musculoskeletal: Positive for arthralgias.  Allergic/Immunologic: Negative for environmental allergies.      ROS see above           Past Medical History:  Diagnosis Date  . Arthritis    . Diabetes mellitus    . Hyperlipidemia                 Past Surgical History:  Procedure Laterality Date  . CESAREAN SECTION   2002  . CHOLECYSTECTOMY   dec 2012  . GASTRIC BYPASS   dec 2012  . ORTHOPEDIC SURGERY             Family History  Problem Relation Age of Onset  . Hypertension Mother    . Depression Mother    . Diabetes Mother    . Hypertension Father    . Hyperlipidemia Father    . Cancer Paternal Grandmother    . Cancer Paternal Grandfather             Social History  Substance Use Topics  . Smoking status: Never Smoker  . Smokeless tobacco: Never Used  . Alcohol use No      Current Outpatient Prescriptions:  .  Blood Glucose Monitoring Suppl (BLOOD GLUCOSE SYSTEM PAK) KIT, Please dispense based on patient and insurance preference. Use as directed to monitor FSBS 1x daily. Dx: E11.9., Disp: 1 each, Rfl: 1 .  cholecalciferol (VITAMIN D) 1000 UNITS tablet, Take 1,000 Units by mouth daily., Disp: , Rfl:  .  cyanocobalamin (,VITAMIN B-12,) 1000 MCG/ML injection, Inject 1 mL (1,000 mcg total) into the muscle every 30 (thirty) days., Disp: 3 mL, Rfl: 3 .  Glucose Blood (BLOOD GLUCOSE TEST STRIPS)  STRP, Please dispense based on patient and insurance preference. Use as directed to monitor FSBS 1x daily. Dx: E11.9., Disp: 100 each, Rfl: 3 .  HYDROcodone-acetaminophen (NORCO) 5-325 MG tablet, Take 1 tablet by mouth every 6 (six) hours as needed for moderate pain., Disp: 30 tablet, Rfl: 0 .  Lancet Devices MISC, Please dispense based on patient and insurance preference. Use as directed to monitor FSBS 1x daily. Dx: E11.9., Disp: 1 each, Rfl: 1 .  Lancets MISC, Please dispense based on patient and insurance preference. Use as directed to monitor FSBS 1x daily. Dx: E11.9., Disp: 100 each, Rfl: 3 .  metFORMIN (GLUCOPHAGE) 500 MG tablet, Take 1 tablet (500 mg total) by mouth daily with breakfast., Disp: 30 tablet, Rfl: 3 .  Multiple Vitamin (MULTIVITAMIN) tablet, Take 1 tablet by mouth 2 (two) times daily., Disp: , Rfl:  .  NEEDLE, DISP, 25 G 25G X 1" MISC, Use to inject Vit B12 IM Q month, Disp: 3 each, Rfl: 3 .  Syringe, Disposable, 3 ML MISC, Use to inject Vit B12 IM Q month, Disp: 3 each, Rfl: 3 .  Vitamin D, Ergocalciferol, (DRISDOL)  50000 units CAPS capsule, Take (1) capsule by mouth every week x12 weeks, then stop., Disp: 12 capsule, Rfl: 0 .  zolpidem (AMBIEN) 10 MG tablet, Take 1 tablet (10 mg total) by mouth at bedtime as needed for sleep., Disp: 30 tablet, Rfl: 3 .  cyclobenzaprine (FLEXERIL) 10 MG tablet, TAKE ONE TABLET BY MOUTH THREE TIMES DAILY AS NEEDED FOR MUSCLE SPASMS (Patient not taking: Reported on 08/19/2016), Disp: 30 tablet, Rfl: 0 .  gabapentin (NEURONTIN) 300 MG capsule, Take 1 capsule (300 mg total) by mouth 2 (two) times daily. (Patient not taking: Reported on 08/19/2016), Disp: 60 capsule, Rfl: 3 .  predniSONE (DELTASONE) 10 MG tablet, Take 55m x 3 days,239mx 3 days, 1062m 3 days (Patient not taking: Reported on 08/19/2016), Disp: 21 tablet, Rfl: 0   BP (!) 153/93   Pulse 85   Ht '5\' 4"'  (1.626 m)   Wt 253 lb (114.8 kg)   BMI 43.43 kg/m    Physical Exam   Constitutional: She is oriented to person, place, and time. She appears well-developed and well-nourished. No distress.  HENT:  Head: Normocephalic and atraumatic.  Cardiovascular: Normal rate and intact distal pulses.   Neurological: She is alert and oriented to person, place, and time. She has normal reflexes. She exhibits normal muscle tone. Coordination normal.  Skin: Skin is warm and dry. No rash noted. She is not diaphoretic. No erythema. No pallor.  Psychiatric: She has a normal mood and affect. Her behavior is normal. Judgment and thought content normal.      Ortho Exam Right and left hand and wrist exam. No swelling is noted there is tenderness over both carpal tunnels. She has maintained equal range of motion of both wrists and hands without any instability. Negative Watson test. She has weak grip strength in each hand right is worse than left. Skin clean dry and intact with warm with no nodules. Pulses are good temperature is normal radial pulses 2+ in each hand. Allen's test is normal.   Lymph nodes in the elbow area normal nonpalpable. She has sensory loss in the thumb index long and part of the ring finger and both right and left hand       ASSESSMENT: Nerve conduction study is positive for bilateral carpal tunnel syndrome moderate to severe   She also has a risk factor diabetes   She is right hand dominant registration clerk at [facility]     PLAN Left carpal tunnel syndrome  Left carpal tunnel release  This procedure has been fully reviewed with the patient and written informed consent has been obtained.

## 2016-10-22 ENCOUNTER — Encounter (HOSPITAL_COMMUNITY)
Admission: RE | Disposition: A | Discharge status: Home / Self Care | Payer: Self-pay | Source: Ambulatory Visit | Attending: Orthopedic Surgery

## 2016-10-22 ENCOUNTER — Ambulatory Visit (HOSPITAL_COMMUNITY): Payer: 59 | Admitting: Anesthesiology

## 2016-10-22 ENCOUNTER — Other Ambulatory Visit: Payer: Self-pay | Admitting: Orthopedic Surgery

## 2016-10-22 ENCOUNTER — Encounter (HOSPITAL_COMMUNITY): Payer: Self-pay | Admitting: *Deleted

## 2016-10-22 ENCOUNTER — Ambulatory Visit (HOSPITAL_COMMUNITY)
Admission: RE | Admit: 2016-10-22 | Discharge: 2016-10-22 | Disposition: A | Discharge status: Home / Self Care | Payer: 59 | Source: Ambulatory Visit | Attending: Orthopedic Surgery | Admitting: Orthopedic Surgery

## 2016-10-22 DIAGNOSIS — Z7984 Long term (current) use of oral hypoglycemic drugs: Secondary | ICD-10-CM | POA: Diagnosis not present

## 2016-10-22 DIAGNOSIS — Z9884 Bariatric surgery status: Secondary | ICD-10-CM | POA: Insufficient documentation

## 2016-10-22 DIAGNOSIS — G4730 Sleep apnea, unspecified: Secondary | ICD-10-CM | POA: Insufficient documentation

## 2016-10-22 DIAGNOSIS — E785 Hyperlipidemia, unspecified: Secondary | ICD-10-CM | POA: Diagnosis not present

## 2016-10-22 DIAGNOSIS — G5603 Carpal tunnel syndrome, bilateral upper limbs: Secondary | ICD-10-CM | POA: Insufficient documentation

## 2016-10-22 DIAGNOSIS — Z6841 Body Mass Index (BMI) 40.0 and over, adult: Secondary | ICD-10-CM | POA: Diagnosis not present

## 2016-10-22 DIAGNOSIS — G5601 Carpal tunnel syndrome, right upper limb: Secondary | ICD-10-CM

## 2016-10-22 DIAGNOSIS — M1990 Unspecified osteoarthritis, unspecified site: Secondary | ICD-10-CM | POA: Insufficient documentation

## 2016-10-22 DIAGNOSIS — Z01818 Encounter for other preprocedural examination: Secondary | ICD-10-CM | POA: Insufficient documentation

## 2016-10-22 DIAGNOSIS — E119 Type 2 diabetes mellitus without complications: Secondary | ICD-10-CM | POA: Diagnosis not present

## 2016-10-22 DIAGNOSIS — Z4889 Encounter for other specified surgical aftercare: Secondary | ICD-10-CM

## 2016-10-22 DIAGNOSIS — G5602 Carpal tunnel syndrome, left upper limb: Secondary | ICD-10-CM | POA: Diagnosis not present

## 2016-10-22 HISTORY — PX: CARPAL TUNNEL RELEASE: SHX101

## 2016-10-22 LAB — GLUCOSE, CAPILLARY: Glucose-Capillary: 109 mg/dL — ABNORMAL HIGH (ref 65–99)

## 2016-10-22 SURGERY — CARPAL TUNNEL RELEASE
Anesthesia: Regional | Site: Hand | Laterality: Left

## 2016-10-22 MED ORDER — BUPIVACAINE HCL (PF) 0.5 % IJ SOLN
INTRAMUSCULAR | Status: AC
  Filled 2016-10-22: qty 30

## 2016-10-22 MED ORDER — LIDOCAINE HCL (PF) 0.5 % IJ SOLN
INTRAMUSCULAR | Status: DC | PRN
  Administered 2016-10-22: 250 mg via INTRAVENOUS

## 2016-10-22 MED ORDER — FENTANYL CITRATE (PF) 100 MCG/2ML IJ SOLN
INTRAMUSCULAR | Status: DC | PRN
Start: 2016-10-22 — End: 2016-10-22
  Administered 2016-10-22 (×4): 25 ug via INTRAVENOUS

## 2016-10-22 MED ORDER — CEFAZOLIN SODIUM-DEXTROSE 2-4 GM/100ML-% IV SOLN
2.0000 g | INTRAVENOUS | Status: AC
  Administered 2016-10-22: 2 g via INTRAVENOUS
  Filled 2016-10-22: qty 100

## 2016-10-22 MED ORDER — HYDROCODONE-ACETAMINOPHEN 7.5-325 MG PO TABS: 1.0000 | ORAL_TABLET | ORAL | 0 refills | Status: DC | PRN

## 2016-10-22 MED ORDER — CHLORHEXIDINE GLUCONATE 4 % EX LIQD: 60.0000 mL | Freq: Once | CUTANEOUS | Status: DC

## 2016-10-22 MED ORDER — BUPIVACAINE HCL (PF) 0.5 % IJ SOLN
INTRAMUSCULAR | Status: DC | PRN
  Administered 2016-10-22: 10 mL

## 2016-10-22 MED ORDER — LIDOCAINE HCL (PF) 0.5 % IJ SOLN
INTRAMUSCULAR | Status: AC
  Filled 2016-10-22: qty 50

## 2016-10-22 MED ORDER — 0.9 % SODIUM CHLORIDE (POUR BTL) OPTIME
TOPICAL | Status: DC | PRN
  Administered 2016-10-22: 1000 mL

## 2016-10-22 MED ORDER — LIDOCAINE HCL (PF) 0.5 % IJ SOLN: INTRAMUSCULAR | Status: DC | PRN

## 2016-10-22 MED ORDER — LACTATED RINGERS IV SOLN
INTRAVENOUS | Status: DC
  Administered 2016-10-22: 1000 mL via INTRAVENOUS

## 2016-10-22 MED ORDER — HYDROMORPHONE HCL 1 MG/ML IJ SOLN
0.2500 mg | INTRAMUSCULAR | Status: DC | PRN
  Administered 2016-10-22 (×2): 0.5 mg via INTRAVENOUS
  Filled 2016-10-22 (×2): qty 0.5

## 2016-10-22 MED ORDER — HYDROCODONE-ACETAMINOPHEN 7.5-325 MG/15ML PO SOLN: 15.0000 mL | Freq: Four times a day (QID) | ORAL | 0 refills | Status: DC | PRN

## 2016-10-22 MED ORDER — PROPOFOL 500 MG/50ML IV EMUL
INTRAVENOUS | Status: DC | PRN
  Administered 2016-10-22: 50 ug/kg/min via INTRAVENOUS

## 2016-10-22 MED ORDER — FENTANYL CITRATE (PF) 100 MCG/2ML IJ SOLN
25.0000 ug | INTRAMUSCULAR | Status: AC | PRN
  Administered 2016-10-22 (×2): 25 ug via INTRAVENOUS

## 2016-10-22 MED ORDER — MIDAZOLAM HCL 5 MG/5ML IJ SOLN
INTRAMUSCULAR | Status: DC | PRN
  Administered 2016-10-22: 2 mg via INTRAVENOUS

## 2016-10-22 MED ORDER — FENTANYL CITRATE (PF) 100 MCG/2ML IJ SOLN
INTRAMUSCULAR | Status: AC
  Filled 2016-10-22: qty 2

## 2016-10-22 MED ORDER — MIDAZOLAM HCL 2 MG/2ML IJ SOLN
1.0000 mg | INTRAMUSCULAR | Status: DC | PRN
  Administered 2016-10-22 (×2): 2 mg via INTRAVENOUS
  Filled 2016-10-22 (×2): qty 2

## 2016-10-22 SURGICAL SUPPLY — 42 items
BAG HAMPER (MISCELLANEOUS) ×3 IMPLANT
BANDAGE ELASTIC 3 LF NS (GAUZE/BANDAGES/DRESSINGS) ×3 IMPLANT
BANDAGE ESMARK 4X12 BL STRL LF (DISPOSABLE) ×1 IMPLANT
BLADE SURG 15 STRL LF DISP TIS (BLADE) ×1 IMPLANT
BLADE SURG 15 STRL SS (BLADE) ×3
BNDG CMPR 12X4 ELC STRL LF (DISPOSABLE) ×1
BNDG CMPR MED 5X3 ELC HKLP NS (GAUZE/BANDAGES/DRESSINGS) ×1
BNDG COHESIVE 4X5 TAN STRL (GAUZE/BANDAGES/DRESSINGS) ×3 IMPLANT
BNDG ESMARK 4X12 BLUE STRL LF (DISPOSABLE) ×3
BNDG GAUZE ELAST 4 BULKY (GAUZE/BANDAGES/DRESSINGS) ×4 IMPLANT
CHLORAPREP W/TINT 26ML (MISCELLANEOUS) ×3 IMPLANT
CLOTH BEACON ORANGE TIMEOUT ST (SAFETY) ×3 IMPLANT
COVER LIGHT HANDLE STERIS (MISCELLANEOUS) ×6 IMPLANT
CUFF TOURNIQUET SINGLE 18IN (TOURNIQUET CUFF) ×3 IMPLANT
DECANTER SPIKE VIAL GLASS SM (MISCELLANEOUS) ×3 IMPLANT
DRAPE PROXIMA HALF (DRAPES) ×3 IMPLANT
DRSG XEROFORM 1X8 (GAUZE/BANDAGES/DRESSINGS) ×2 IMPLANT
ELECT NDL TIP 2.8 STRL (NEEDLE) ×1 IMPLANT
ELECT NEEDLE TIP 2.8 STRL (NEEDLE) ×3 IMPLANT
ELECT REM PT RETURN 9FT ADLT (ELECTROSURGICAL) ×3
ELECTRODE REM PT RTRN 9FT ADLT (ELECTROSURGICAL) ×1 IMPLANT
GAUZE SPONGE 4X4 12PLY STRL (GAUZE/BANDAGES/DRESSINGS) ×4 IMPLANT
GLOVE BIOGEL PI IND STRL 6.5 (GLOVE) IMPLANT
GLOVE BIOGEL PI INDICATOR 6.5 (GLOVE) ×2
GLOVE EXAM NITRILE MD LF STRL (GLOVE) ×2 IMPLANT
GLOVE SKINSENSE NS SZ8.0 LF (GLOVE) ×2
GLOVE SKINSENSE STRL SZ8.0 LF (GLOVE) ×1 IMPLANT
GLOVE SS N UNI LF 8.5 STRL (GLOVE) ×3 IMPLANT
GOWN STRL REUS W/ TWL LRG LVL3 (GOWN DISPOSABLE) ×1 IMPLANT
GOWN STRL REUS W/TWL LRG LVL3 (GOWN DISPOSABLE) ×3
GOWN STRL REUS W/TWL XL LVL3 (GOWN DISPOSABLE) ×3 IMPLANT
HAND ALUMI XLG (SOFTGOODS) ×3 IMPLANT
KIT ROOM TURNOVER APOR (KITS) ×3 IMPLANT
MANIFOLD NEPTUNE II (INSTRUMENTS) ×3 IMPLANT
NDL HYPO 21X1.5 SAFETY (NEEDLE) ×1 IMPLANT
NEEDLE HYPO 21X1.5 SAFETY (NEEDLE) ×3 IMPLANT
NS IRRIG 1000ML POUR BTL (IV SOLUTION) ×3 IMPLANT
PACK BASIC LIMB (CUSTOM PROCEDURE TRAY) ×3 IMPLANT
PAD ARMBOARD 7.5X6 YLW CONV (MISCELLANEOUS) ×3 IMPLANT
SET BASIN LINEN APH (SET/KITS/TRAYS/PACK) ×3 IMPLANT
SUT ETHILON 3 0 FSL (SUTURE) ×3 IMPLANT
SYR CONTROL 10ML LL (SYRINGE) ×3 IMPLANT

## 2016-10-22 NOTE — Transfer of Care (Signed)
Immediate Anesthesia Transfer of Care Note  Patient: Sabrina Roberts  Procedure(s) Performed: Procedure(s): CARPAL TUNNEL RELEASE (Left)  Patient Location: PACU  Anesthesia Type:MAC and Bier block  Level of Consciousness: awake, alert , oriented and patient cooperative  Airway & Oxygen Therapy: Patient Spontanous Breathing and Patient connected to nasal cannula oxygen  Post-op Assessment: Report given to RN and Post -op Vital signs reviewed and stable  Post vital signs: Reviewed and stable  Last Vitals:  Vitals:   10/22/16 1240 10/22/16 1245  BP: 119/72 119/76  Pulse:    Resp: (!) 21 14  Temp:      Last Pain:  Vitals:   10/22/16 1213  TempSrc: Oral  PainSc: 3       Patients Stated Pain Goal: 6 ([id number])  Complications: No apparent anesthesia complications

## 2016-10-22 NOTE — Anesthesia Procedure Notes (Signed)
Procedure Name: MAC Date/Time: 10/22/2016 12:49 PM Performed by: Andree Elk, Jaena Brocato A Pre-anesthesia Checklist: Patient identified, Timeout performed, Emergency Drugs available, Suction available and Patient being monitored Oxygen Delivery Method: Simple face mask

## 2016-10-22 NOTE — Op Note (Signed)
10/22/2016 1:59 PM Arther Abbott, MD Carpal tunnel release left wrist  Preop diagnosis carpal tunnel syndrome left wrist   postop diagnosis same  Procedure open carpal tunnel release left wrist Surgeon [name]  Anesthesia regional Bier block  Operative findings compression of the left median nerve without any anterior carpal tunnel masses   Indications failure of conservative treatment to relieve pain and paresthesias and numbness and tingling of the left hand  The patient was identified in the preop area we confirm the surgical site marked as left wrist. Chart update completed. Patient taken to surgery. She had 2 g of Ancef. After establishing a Bier block left her arm was prepped with ChloraPrep.  Timeout executed completed and confirmed site.  A straight incision was made over the left carpal tunnel in line with the radial border of the ring finger. Blunt dissection was carried out to find the distal aspect of the carpal tunnel. A blunted instrument was passed beneath the carpal tunnel. Sharp incision was then used to release the transverse carpal ligament. The contents of the carpal tunnel were inspected. The median nerve was compressed with slight discoloration.  The wound was irrigated and then closed with 3-0 nylon suture. We injected 10 mL of plain Marcaine on the radial side of the incision  A sterile bandage was applied and the tourniquet was released the color of the hand and capillary refill were normal  The patient was taken to the recovery room in stable condition

## 2016-10-22 NOTE — Discharge Instructions (Signed)
Carpal Tunnel Release, Care After °Refer to this sheet in the next few weeks. These instructions provide you with information about caring for yourself after your procedure. Your health care provider may also give you more specific instructions. Your treatment has been planned according to current medical practices, but problems sometimes occur. Call your health care provider if you have any problems or questions after your procedure. °WHAT TO EXPECT AFTER THE PROCEDURE °After your procedure, it is typical to have the following: °· Pain. °· Numbness. °· Tingling. °· Swelling. °· Stiffness. °· Bruising. °HOME CARE INSTRUCTIONS °· Take medicines only as directed by your health care provider. °· There are many different ways to close and cover an incision, including stitches (sutures), skin glue, and adhesive strips. Follow your health care provider's instructions about: °¨ Incision care. °¨ Bandage (dressing) changes and removal. °¨ Incision closure removal. °· Wear a splint or a brace as directed by your surgeon. You may need to do this for 2-3 weeks. °· Keep your hand raised (elevated) above the level of your heart while you are resting. Move your fingers often. °· Avoid activities that cause hand pain. °· Ask your surgeon when you can start to do all of your usual activities again, such as: °¨ Driving. °¨ Returning to work. °¨ Bathing and swimming. °· Keep all follow-up visits as directed by your health care provider. This is important. You may need physical therapy for several months to speed healing and regain movement. °SEEK MEDICAL CARE IF: °· You have drainage, redness, swelling, or pain at your incision site. °· You have a fever. °· You have chills. °· Your pain medicine is not working. °· Your symptoms do not go away after 2 months. °· Your symptoms go away and then return. °SEEK IMMEDIATE MEDICAL CARE IF: °· You have pain or numbness that is getting worse. °· Your fingers change color. °· You are not able  to move your fingers. °  °This information is not intended to replace advice given to you by your health care provider. Make sure you discuss any questions you have with your health care provider. °  °Document Released: 07/03/2005 Document Revised: 01/04/2015 Document Reviewed: 08/01/2014 °Elsevier Interactive Patient Education ©2016 [employer] Inc[employer] ° °

## 2016-10-22 NOTE — Brief Op Note (Signed)
10/22/2016  1:56 PM  PATIENT:  Sabrina Roberts  45 y.o. female  PRE-OPERATIVE DIAGNOSIS:  left carpal tunnel syndrome  POST-OPERATIVE DIAGNOSIS:  left carpal tunnel syndrome  PROCEDURE:  Procedure(s): CARPAL TUNNEL RELEASE (Left)  SURGEON:  Surgeon(s) and Role:    * Carole Civil, MD - Primary  PHYSICIAN ASSISTANT:   ASSISTANTS: none   ANESTHESIA:   regional  EBL:  Total I/O In: 700 [I.V.:700] Out: 0   BLOOD ADMINISTERED:none  DRAINS: none   LOCAL MEDICATIONS USED:  MARCAINE     SPECIMEN:  No Specimen  DISPOSITION OF SPECIMEN:  N/A  COUNTS:  YES  TOURNIQUET:   Total Tourniquet Time Documented: Upper Arm (Left) - 32 minutes Total: Upper Arm (Left) - 32 minutes   DICTATION: .Viviann Spare Dictation  PLAN OF CARE: Discharge to home after PACU  PATIENT DISPOSITION:  PACU - hemodynamically stable.   Delay start of Pharmacological VTE agent (>24hrs) due to surgical blood loss or risk of bleeding: not applicable  Carpal tunnel release left wrist  Preop diagnosis carpal tunnel syndrome left wrist   postop diagnosis same  Procedure open carpal tunnel release left wrist Surgeon [name]  Anesthesia regional Bier block  Operative findings compression of the left median nerve without any anterior carpal tunnel masses   Indications failure of conservative treatment to relieve pain and paresthesias and numbness and tingling of the left hand  The patient was identified in the preop area we confirm the surgical site marked as left wrist. Chart update completed. Patient taken to surgery. She had 2 g of Ancef. After establishing a Bier block left her arm was prepped with ChloraPrep.  Timeout executed completed and confirmed site.  A straight incision was made over the left carpal tunnel in line with the radial border of the ring finger. Blunt dissection was carried out to find the distal aspect of the carpal tunnel. A blunted instrument was passed beneath the carpal  tunnel. Sharp incision was then used to release the transverse carpal ligament. The contents of the carpal tunnel were inspected. The median nerve was compressed with slight discoloration.  The wound was irrigated and then closed with 3-0 nylon suture. We injected 10 mL of plain Marcaine on the radial side of the incision  A sterile bandage was applied and the tourniquet was released the color of the hand and capillary refill were normal  The patient was taken to the recovery room in stable condition   [phone]

## 2016-10-22 NOTE — Anesthesia Postprocedure Evaluation (Signed)
Anesthesia Post Note  Patient: Sabrina Roberts  Procedure(s) Performed: Procedure(s) (LRB): CARPAL TUNNEL RELEASE (Left)  Patient location during evaluation: PACU Anesthesia Type: Bier Block Level of consciousness: awake and alert and oriented Pain management: pain level controlled Vital Signs Assessment: post-procedure vital signs reviewed and stable Respiratory status: spontaneous breathing Cardiovascular status: stable Postop Assessment: no signs of nausea or vomiting Anesthetic complications: no    Last Vitals:  Vitals:   10/22/16 1240 10/22/16 1245  BP: 119/72 119/76  Pulse:    Resp: (!) 21 14  Temp:      Last Pain:  Vitals:   10/22/16 1213  TempSrc: Oral  PainSc: 3                  Damoni Causby A

## 2016-10-22 NOTE — Anesthesia Preprocedure Evaluation (Signed)
Anesthesia Evaluation  Patient identified by MRN, date of birth, ID band Patient awake    Reviewed: Allergy & Precautions, NPO status , Patient's Chart, lab work & pertinent test results  Airway Mallampati: II  TM Distance: >3 FB     Dental  (+) Teeth Intact   Pulmonary sleep apnea ,    breath sounds clear to auscultation       Cardiovascular negative cardio ROS   Rhythm:Regular Rate:Normal     Neuro/Psych    GI/Hepatic Hx gastric bypass No reflux symptoms   Endo/Other  diabetes, Well Controlled, Type 2, Oral Hypoglycemic AgentsMorbid obesity  Renal/GU      Musculoskeletal   Abdominal (+) + obese,   Peds  Hematology   Anesthesia Other Findings   Reproductive/Obstetrics                             Anesthesia Physical Anesthesia Plan  ASA: II  Anesthesia Plan: Bier Block   Post-op Pain Management:    Induction: Intravenous  Airway Management Planned: Simple Face Mask  Additional Equipment:   Intra-op Plan:   Post-operative Plan:   Informed Consent: I have reviewed the patients History and Physical, chart, labs and discussed the procedure including the risks, benefits and alternatives for the proposed anesthesia with the patient or authorized representative who has indicated his/her understanding and acceptance.     Plan Discussed with:   Anesthesia Plan Comments:         Anesthesia Quick Evaluation

## 2016-10-22 NOTE — Anesthesia Procedure Notes (Addendum)
Anesthesia Regional Block:  Bier block (IV Regional)  Pre-Anesthetic Checklist: ,, timeout performed, Correct Patient, Correct Site, Correct Laterality, Correct Procedure,, site marked, surgical consent,, at surgeon's request  Laterality: Left     Needles:  Injection technique: Single-shot  Needle Type: Other      Needle Gauge: 22 and 22 G    Additional Needles: Bier block (IV Regional)  Nerve Stimulator or Paresthesia:   Additional Responses:  Pulse checked post tourniquet inflation. IV NSL discontinued post injection. Narrative:  Start time: 10/22/2016 1:02 PM  Performed by: Personally

## 2016-10-22 NOTE — Interval H&P Note (Signed)
History and Physical Interval Note:  10/22/2016 11:53 AM  Sabrina Roberts  has presented today for surgery, with the diagnosis of left carpal tunnel syndrome  The various methods of treatment have been discussed with the patient and family. After consideration of risks, benefits and other options for treatment, the patient has consented to  Procedure(s): CARPAL TUNNEL RELEASE (Left) as a surgical intervention .  The patient's history has been reviewed, patient examined, no change in status, stable for surgery.  I have reviewed the patient's chart and labs.  Questions were answered to the patient's satisfaction.     Arther Abbott

## 2016-10-23 ENCOUNTER — Telehealth: Payer: Self-pay | Admitting: Orthopedic Surgery

## 2016-10-23 LAB — GLUCOSE, CAPILLARY: Glucose-Capillary: 99 mg/dL (ref 65–99)

## 2016-10-23 NOTE — Telephone Encounter (Signed)
Additional - patient also stated she has an upset stomach and can't eat; states "this is not like the last carpal tunnel surgery she recently had". *  *Per Baldomero Lamy, LPN with Dr [name], patient to go to Emergency room, as office closing within 30 minutes.  Called back to patient, relayed.  She will "try to wait it out a bit", and if does not feel better in awhile, will go on to the Emergency room.  Aware this is Dr's advice.

## 2016-10-23 NOTE — Telephone Encounter (Signed)
Patient has just called, states she's been in pain during the night and this morning status/post carpal tunnel surgery, done 10/22/16. States post op appt is Monday, 10/26/16.  Please advise, due to Dr [name] here only 30 minutes longer, and patient lives 30+minutes away.

## 2016-10-26 ENCOUNTER — Ambulatory Visit (INDEPENDENT_AMBULATORY_CARE_PROVIDER_SITE_OTHER): Payer: 59 | Admitting: Orthopedic Surgery

## 2016-10-26 ENCOUNTER — Encounter: Payer: Self-pay | Admitting: Orthopedic Surgery

## 2016-10-26 DIAGNOSIS — Z4889 Encounter for other specified surgical aftercare: Secondary | ICD-10-CM

## 2016-10-26 MED ORDER — HYDROCODONE-ACETAMINOPHEN 7.5-325 MG/15ML PO SOLN: 15.0000 mL | Freq: Four times a day (QID) | ORAL | 0 refills | Status: DC | PRN

## 2016-10-26 NOTE — Progress Notes (Signed)
Patient ID: Sabrina Roberts, female   DOB: 1971/12/11, 45 y.o.   MRN: [id number]  Post op visit   Chief Complaint  Patient presents with  . Follow-up    CT surgery left 10/22/16    There were no vitals taken for this visit.  Postop visit #1 carpal tunnel release left wrist.  Patient complains fingers are still numb and more pain distal and proximal versus her last surgery  Everything looks fine the swelling is normal and minor  I refilled her medication. He out of the office next week  Postop day 13 or 14 suture removal.

## 2016-10-27 ENCOUNTER — Encounter (HOSPITAL_COMMUNITY): Payer: Self-pay | Admitting: Orthopedic Surgery

## 2016-10-27 ENCOUNTER — Ambulatory Visit: Payer: 59 | Admitting: Orthopedic Surgery

## 2016-10-30 ENCOUNTER — Ambulatory Visit: Payer: Self-pay | Admitting: Family Medicine

## 2016-11-04 ENCOUNTER — Ambulatory Visit (INDEPENDENT_AMBULATORY_CARE_PROVIDER_SITE_OTHER): Payer: 59 | Admitting: Orthopedic Surgery

## 2016-11-04 ENCOUNTER — Encounter: Payer: Self-pay | Admitting: Orthopedic Surgery

## 2016-11-04 ENCOUNTER — Ambulatory Visit: Payer: 59 | Admitting: Orthopedic Surgery

## 2016-11-04 DIAGNOSIS — Z9889 Other specified postprocedural states: Secondary | ICD-10-CM

## 2016-11-04 DIAGNOSIS — Z9289 Personal history of other medical treatment: Secondary | ICD-10-CM

## 2016-11-04 DIAGNOSIS — G5603 Carpal tunnel syndrome, bilateral upper limbs: Secondary | ICD-10-CM

## 2016-11-04 DIAGNOSIS — Z4889 Encounter for other specified surgical aftercare: Secondary | ICD-10-CM

## 2016-11-04 MED ORDER — HYDROCODONE-ACETAMINOPHEN 7.5-325 MG/15ML PO SOLN: 15.0000 mL | Freq: Four times a day (QID) | ORAL | 0 refills | Status: DC | PRN

## 2016-11-04 NOTE — Patient Instructions (Signed)
IBUPROFEN 400 MG TWICE A DAY

## 2016-11-04 NOTE — Progress Notes (Signed)
Sabrina Roberts is status post left carpal tunnel release   S:   Chief Complaint  Patient presents with  . Follow-up    POST OP 2, LT CTR, DOS 10/22/16   She also had a right carpal tunnel release several weeks back  She has more soreness and pain in the left than the right has decreased amount of numbness bilaterally although she still has some numbness in each hand  She did have a gastric bypass in her ibuprofen and NSAID use is limited but she does get improvement in the soreness with 600 mg ibuprofen daily. The liquid hydrocodone doesn't seem to work as well as cutting 17.5 mg tablet in half  The sutures were removed the wound looks clean dry and intact  We will start ibuprofen 400 mg twice a day and continue with 15 mL of hydrocodone at 7.5 mg per dose  Return to work date is scheduled for November 27  RETURN AS NEEDED

## 2016-11-22 ENCOUNTER — Encounter: Payer: Self-pay | Admitting: Orthopedic Surgery

## 2016-11-30 ENCOUNTER — Telehealth: Payer: Self-pay | Admitting: Orthopedic Surgery

## 2016-11-30 NOTE — Telephone Encounter (Signed)
Hydrocodone-Acetaminophen (HYCET) 7.5/325MG /15 mL solution    Take 15 mLs by mouth 4(four) times daily as needed for moderate pain.

## 2016-11-30 NOTE — Telephone Encounter (Signed)
ROUTING TO DR HARRISON FOR APPROVAL 

## 2016-12-01 ENCOUNTER — Other Ambulatory Visit: Payer: Self-pay | Admitting: *Deleted

## 2016-12-01 ENCOUNTER — Other Ambulatory Visit: Payer: Self-pay | Admitting: Orthopedic Surgery

## 2016-12-01 MED ORDER — HYDROCODONE-ACETAMINOPHEN 2.5-108 MG/5ML PO SOLN: 10.0000 mL | ORAL | 0 refills | Status: DC | PRN

## 2016-12-01 NOTE — Progress Notes (Unsigned)
Et  

## 2016-12-01 NOTE — Progress Notes (Signed)
Refill with dose decrease

## 2016-12-01 NOTE — Telephone Encounter (Signed)
5MG

## 2016-12-01 NOTE — Telephone Encounter (Signed)
Not able to pull up 5mg  liquid

## 2016-12-08 ENCOUNTER — Ambulatory Visit: Payer: 59 | Admitting: Orthopedic Surgery

## 2017-01-01 ENCOUNTER — Other Ambulatory Visit: Payer: Self-pay | Admitting: Family Medicine

## 2017-01-18 ENCOUNTER — Telehealth: Payer: Self-pay | Admitting: Orthopaedic Surgery

## 2017-01-18 ENCOUNTER — Other Ambulatory Visit: Payer: Self-pay | Admitting: *Deleted

## 2017-01-18 MED ORDER — HYDROCODONE-ACETAMINOPHEN 2.5-108 MG/5ML PO SOLN: 10.0000 mL | ORAL | 0 refills | Status: DC | PRN

## 2017-01-18 NOTE — Telephone Encounter (Signed)
Routing to nurse for Dr [name] to review and advise.

## 2017-01-18 NOTE — Telephone Encounter (Signed)
YES

## 2017-01-18 NOTE — Telephone Encounter (Signed)
Hydrocodone-Acetaminophen 2.5-108 MG/5ML SOLN [id number]  Order Details  Dose: 10 mL Route: Oral Frequency: Every 4 hours PRN  Dispense Quantity:  600 mL Refill 0 Fills remaining:  --        Sig: Take 10 mLs by mouth every 4 (four) hours as needed.

## 2017-01-18 NOTE — Telephone Encounter (Signed)
ROUTING TO DR HARRSION FOR APPROVAL

## 2017-01-19 ENCOUNTER — Other Ambulatory Visit: Payer: Self-pay | Admitting: Orthopedic Surgery

## 2017-01-19 NOTE — Patient Instructions (Signed)
What You Need to Know About Prescription Opioid Pain Medicine Opioids are powerful medicines that are used to treat moderate to severe pain. Opioids should be taken with the supervision of a trained health care provider. They should be taken for the shortest period of time as possible. This is because opioids can be addictive and the longer you take opioids, the greater your risk of addiction (opioid use disorder). What do opioids do? Opioids help reduce or eliminate pain. When used for short periods of time, they can help you:  Sleep better.  Do better in physical or occupational therapy.  Feel better in the first few days after an injury.  Recover from surgery.  What kind of problems can opioids cause? Opioids can cause side effects, such as:  Constipation.  Nausea.  Vomiting.  Drowsiness.  Confusion.  Opioid use disorder.  Breathing difficulties (respiratory depression).  Using opioid pain medicines for longer than 3 days increases your risk of these side effects. Taking opioid pain medicine for a long period of time can affect your ability to do daily tasks. It also puts you at risk for:  Car accidents.  Heart attack.  Overdose, which can sometimes lead to death.  What can increase my risk for developing problems while taking opioids? You may be at an especially high risk for problems while taking opioids if you:  Are over the age of 65.  Are pregnant.  Have kidney or liver disease.  Have certain mental health conditions, such as depression or anxiety.  Have a history of substance use disorder.  Have had an opioid overdose in the past.  How do I stop taking opioids if I have been taking them for a long time? If you have been taking opioid medicine for more than a few weeks, you may need to slowly stop taking them (taper). Tapering your use of opioids can decrease your chances of experiencing withdrawal symptoms, such as:  Abdominal pain and  cramping.  Nausea.  Sweating.  Sleepiness.  Restlessness.  Uncontrollable shaking (tremors).  Cravings for the medicine.  Do not attempt to taper your use of opioids on your own. Talk with your health care provider about how to do this. Your health care provider may prescribe a step-down schedule based on how much medicine you are taking and how long you have been taking it. What are the benefits of stopping the use of opioids? By switching from opioid pain medicine to non-opioid pain management options, you will decrease your risk of accidents and injuries associated with long-term opioid use. You will also be able to:  Monitor your pain more accurately and know when to seek medical care if it is not improving.  Decrease risk to others around you. Having opioids in the home increases the risk for accidental or intentional use or overdose by others.  How can I treat pain without opioids? Pain can be managed with many types of alternative treatments. Ask your health care provider to refer you to one or more specialists who can help you manage pain through:  Physical or occupational therapy.  Counseling (cognitive-behavioral therapy).  Good nutrition.  Biofeedback.  Massage.  Meditation.  Non-opioid medicine.  Following a gentle exercise program.  Where can I get support? If you have been taking opioids for a long time, you may benefit from receiving support for quitting from a local support group or counselor. Ask your health care provider for a referral to these resources in your area. When should I   seek medical care? Seek medical care right away if you are taking opioids and you experience any of the following:  Difficulty breathing.  Breathing that is more shallow or slower than normal.  A very slow heartbeat (pulse).  Severe confusion.  Unconsciousness.  Sleepiness.  Difficulty waking from sleep.  Slurred speech.  Nausea and vomiting.  Cold, clammy  skin.  Blue lips or fingernails.  Limpness.  Abnormally small pupils.  If you think that you or someone else may have taken too much of an opioid medicine, get medical help right away. Do not wait to see if the symptoms go away on their own. Call your local emergency services (911 in the [place] or call the hotline of the National Poison Control Center (800-222-1222 in the [place] Where can I get more information? To learn more about opioid medicines, visit the Centers for Disease Control and Prevention web site Opioid Basics at https://www.cdc.gov/drugoverdos/opioids/index.html. Summary  Opioid medicines can help you manage moderate-to-severe pain for a short period of time.  Taking opioid pain medicine for a long period of time puts you at risk for unintentional accidents, injury, and even death.  If you think that you or someone else may have taken too much of an opioid, get medical help right away. This information is not intended to replace advice given to you by your health care provider. Make sure you discuss any questions you have with your health care provider. Document Released: 01/10/2016 Document Revised: 08/07/2016 Document Reviewed: 07/26/2015 Elsevier Interactive Patient Education  2017 [employer]  

## 2017-01-19 NOTE — Progress Notes (Signed)
[place] controlled substance reporting system reviewed  Warning given alternative offered

## 2017-02-01 ENCOUNTER — Other Ambulatory Visit: Payer: Self-pay | Admitting: Family Medicine

## 2017-02-02 NOTE — Telephone Encounter (Signed)
RX called in .

## 2017-02-02 NOTE — Telephone Encounter (Signed)
okay

## 2017-02-02 NOTE — Telephone Encounter (Signed)
LRF 09/17/16 #30 +3  LOV 06/10/16  Has appt 02/17/17  OK refill?

## 2017-02-17 ENCOUNTER — Encounter: Payer: 59 | Admitting: Family Medicine

## 2017-03-26 ENCOUNTER — Encounter: Payer: 59 | Admitting: Family Medicine

## 2017-04-09 ENCOUNTER — Telehealth: Payer: Self-pay | Admitting: Orthopedic Surgery

## 2017-04-09 NOTE — Telephone Encounter (Signed)
ROUTING TO DR HARRISON FOR APPROVAL 

## 2017-04-09 NOTE — Telephone Encounter (Signed)
Hydrocodone-Acetaminophen 2.5/108 MG/5ML SOLN  Take 10 mLs by mouth every 4 (four) hours as needed.

## 2017-04-12 ENCOUNTER — Encounter: Payer: Self-pay | Admitting: Family Medicine

## 2017-04-12 ENCOUNTER — Encounter: Payer: Self-pay | Admitting: Physician Assistant

## 2017-04-12 ENCOUNTER — Ambulatory Visit (INDEPENDENT_AMBULATORY_CARE_PROVIDER_SITE_OTHER): Payer: 59 | Admitting: Physician Assistant

## 2017-04-12 VITALS — BP 136/88 | HR 78 | Temp 98.0°F | Resp 16 | Wt 255.4 lb

## 2017-04-12 DIAGNOSIS — J988 Other specified respiratory disorders: Secondary | ICD-10-CM | POA: Diagnosis not present

## 2017-04-12 DIAGNOSIS — B9689 Other specified bacterial agents as the cause of diseases classified elsewhere: Secondary | ICD-10-CM

## 2017-04-12 MED ORDER — AZITHROMYCIN 250 MG PO TABS: ORAL_TABLET | ORAL | 0 refills | Status: DC

## 2017-04-12 NOTE — Progress Notes (Signed)
Patient ID: Sabrina Roberts MRN: [id number], DOB: 25-Aug-1971, 46 y.o. Date of Encounter: 04/12/2017, 11:40 AM    Chief Complaint:  Chief Complaint  Patient presents with  . Sore Throat    x3days  . both ears clogged up  . Fever  . cold like symptoms  . Back Pain     HPI: 46 y.o. year old female presents with above.   Says that symptoms started about a week ago but got worse 2 days ago. Says that she's been having a lot of drainage down her throat. Saturday morning was sweaty and developed fever. Now her ears feel congested/clogged. Has a lot of cough. She was getting out was clear but now is thick yellow green.  Says that she just wanted it documented that she has had recurrence of her low back pain and is now having some numbness down her left leg at times. Says that she has seen Dr. Buelah Manis about this in the past and is scheduling a follow-up appointment with her to address this "but wanted me to document it."     Home Meds:   Outpatient Medications Prior to Visit  Medication Sig Dispense Refill  . Blood Glucose Monitoring Suppl (BLOOD GLUCOSE SYSTEM PAK) KIT Please dispense based on patient and insurance preference. Use as directed to monitor FSBS 1x daily. Dx: E11.9. 1 each 1  . Cholecalciferol (VITAMIN D3) 10000 units capsule Take 10,000 Units by mouth every 7 (seven) days.    . cyanocobalamin (,VITAMIN B-12,) 1000 MCG/ML injection Inject 1 mL (1,000 mcg total) into the muscle every 30 (thirty) days. 3 mL 3  . cyclobenzaprine (FLEXERIL) 10 MG tablet 10 mg 3 (three) times daily as needed for muscle spasms.     Marland Kitchen gabapentin (NEURONTIN) 300 MG capsule Take 1 capsule (300 mg total) by mouth 2 (two) times daily. (Patient taking differently: Take 900 mg by mouth daily. ) 60 capsule 3  . Glucose Blood (BLOOD GLUCOSE TEST STRIPS) STRP Please dispense based on patient and insurance preference. Use as directed to monitor FSBS 1x daily. Dx: E11.9. 100 each 3  . Hydrocodone-Acetaminophen  2.5-108 MG/5ML SOLN Take 10 mLs by mouth every 4 (four) hours as needed. 600 mL 0  . Lancet Devices MISC Please dispense based on patient and insurance preference. Use as directed to monitor FSBS 1x daily. Dx: E11.9. 1 each 1  . Lancets MISC Please dispense based on patient and insurance preference. Use as directed to monitor FSBS 1x daily. Dx: E11.9. 100 each 3  . metFORMIN (GLUCOPHAGE) 500 MG tablet TAKE ONE TABLET BY MOUTH ONCE DAILY WITH BREAKFAST 30 tablet 3  . Multiple Vitamin (MULTIVITAMIN) tablet Take 1 tablet by mouth daily.     Marland Kitchen NEEDLE, DISP, 25 G 25G X 1" MISC Use to inject Vit B12 IM Q month 3 each 3  . promethazine (PHENERGAN) 12.5 MG tablet Take 1 tablet (12.5 mg total) by mouth every 4 (four) hours as needed for nausea or vomiting. 42 tablet 2  . Syringe, Disposable, 3 ML MISC Use to inject Vit B12 IM Q month 3 each 3  . zolpidem (AMBIEN) 10 MG tablet TAKE ONE TABLET BY MOUTH AT BEDTIME AS NEEDED FOR SLEEP 30 tablet 3   No facility-administered medications prior to visit.     Allergies:  Allergies  Allergen Reactions  . Actos [Pioglitazone Hydrochloride] Anaphylaxis, Hives and Other (See Comments)    Chest pain   . Latex Rash  Review of Systems: See HPI for pertinent ROS. All other ROS negative.    Physical Exam: Blood pressure 136/88, pulse 78, temperature 98 F (36.7 C), temperature source Oral, resp. rate 16, weight 255 lb 6.4 oz (115.8 kg)., Body mass index is 43.84 kg/m. General:  Obese AAF. Appears in no acute distress. HEENT: Normocephalic, atraumatic, eyes without discharge, sclera non-icteric, nares are without discharge. Bilateral auditory canals clear, TM's are without perforation, pearly grey and translucent with reflective cone of light bilaterally. Oral cavity moist, posterior pharynx without exudate, erythema, peritonsillar abscess.  Neck: Supple. No thyromegaly. No lymphadenopathy. Lungs: Clear bilaterally to auscultation without wheezes, rales, or  rhonchi. Breathing is unlabored. Heart: Regular rhythm. No murmurs, rubs, or gallops. Msk:  Strength and tone normal for age. Extremities/Skin: Warm and dry. Neuro: Alert and oriented X 3. Moves all extremities spontaneously. Gait is normal. CNII-XII grossly in tact. Psych:  Responds to questions appropriately with a normal affect.     ASSESSMENT AND PLAN:  46 y.o. year old female with  1. Bacterial respiratory infection She is to take the antibiotic as directed. Follow-up if symptoms do not resolve within 1 week after completion of antibiotic. She will follow-up with Dr. Buelah Manis regarding her back pain/sciatica. - azithromycin (ZITHROMAX) 250 MG tablet; Day 1: Take 2 daily. Days 2- 5: Take 1 daily.  Dispense: 6 tablet; Refill: 0   Signed, [address] [place], [place], [facility] 04/12/2017 11:40 AM

## 2017-04-12 NOTE — Telephone Encounter (Signed)
Please advise patient of Dr Ruthe Mannan response and give appt if she wishes

## 2017-04-12 NOTE — Telephone Encounter (Signed)
Will need an appt for opioid

## 2017-05-27 ENCOUNTER — Encounter: Payer: Self-pay | Admitting: Family Medicine

## 2017-06-07 ENCOUNTER — Other Ambulatory Visit: Payer: Self-pay | Admitting: Family Medicine

## 2017-06-07 NOTE — Telephone Encounter (Signed)
Ok to refill 

## 2017-06-07 NOTE — Telephone Encounter (Signed)
okay

## 2017-06-08 ENCOUNTER — Encounter: Payer: Self-pay | Admitting: Family Medicine

## 2017-06-08 ENCOUNTER — Ambulatory Visit (INDEPENDENT_AMBULATORY_CARE_PROVIDER_SITE_OTHER): Payer: 59 | Admitting: Family Medicine

## 2017-06-08 VITALS — BP 128/74 | HR 86 | Temp 97.9°F | Resp 14 | Ht 64.0 in | Wt 251.0 lb

## 2017-06-08 DIAGNOSIS — N926 Irregular menstruation, unspecified: Secondary | ICD-10-CM

## 2017-06-08 DIAGNOSIS — E559 Vitamin D deficiency, unspecified: Secondary | ICD-10-CM | POA: Diagnosis not present

## 2017-06-08 DIAGNOSIS — Z1231 Encounter for screening mammogram for malignant neoplasm of breast: Secondary | ICD-10-CM

## 2017-06-08 DIAGNOSIS — Z124 Encounter for screening for malignant neoplasm of cervix: Secondary | ICD-10-CM | POA: Diagnosis not present

## 2017-06-08 DIAGNOSIS — R946 Abnormal results of thyroid function studies: Secondary | ICD-10-CM

## 2017-06-08 DIAGNOSIS — Z0000 Encounter for general adult medical examination without abnormal findings: Secondary | ICD-10-CM

## 2017-06-08 DIAGNOSIS — E538 Deficiency of other specified B group vitamins: Secondary | ICD-10-CM

## 2017-06-08 DIAGNOSIS — M5126 Other intervertebral disc displacement, lumbar region: Secondary | ICD-10-CM | POA: Diagnosis not present

## 2017-06-08 DIAGNOSIS — F329 Major depressive disorder, single episode, unspecified: Secondary | ICD-10-CM | POA: Insufficient documentation

## 2017-06-08 DIAGNOSIS — Z1239 Encounter for other screening for malignant neoplasm of breast: Secondary | ICD-10-CM

## 2017-06-08 DIAGNOSIS — E119 Type 2 diabetes mellitus without complications: Secondary | ICD-10-CM

## 2017-06-08 DIAGNOSIS — M5136 Other intervertebral disc degeneration, lumbar region: Secondary | ICD-10-CM | POA: Insufficient documentation

## 2017-06-08 DIAGNOSIS — F418 Other specified anxiety disorders: Secondary | ICD-10-CM | POA: Diagnosis not present

## 2017-06-08 LAB — CBC WITH DIFFERENTIAL/PLATELET
Basophils Absolute: 0 cells/uL (ref 0–200)
Basophils Relative: 0 %
Eosinophils Absolute: 77 cells/uL (ref 15–500)
Eosinophils Relative: 1 %
HCT: 36.4 % (ref 35.0–45.0)
Hemoglobin: 11.1 g/dL — ABNORMAL LOW (ref 12.0–15.0)
Lymphocytes Relative: 34 %
Lymphs Abs: 2618 cells/uL (ref 850–3900)
MCH: 27.3 pg (ref 27.0–33.0)
MCHC: 30.5 g/dL — ABNORMAL LOW (ref 32.0–36.0)
MCV: 89.4 fL (ref 80.0–100.0)
MPV: 11.1 fL (ref 7.5–12.5)
Monocytes Absolute: 616 cells/uL (ref 200–950)
Monocytes Relative: 8 %
Neutro Abs: 4389 cells/uL (ref 1500–7800)
Neutrophils Relative %: 57 %
Platelets: 372 10*3/uL (ref 140–400)
RBC: 4.07 MIL/uL (ref 3.80–5.10)
RDW: 13.8 % (ref 11.0–15.0)
WBC: 7.7 10*3/uL (ref 3.8–10.8)

## 2017-06-08 LAB — TSH: TSH: 1.06 mIU/L

## 2017-06-08 LAB — LIPID PANEL
Cholesterol: 182 mg/dL (ref <200)
HDL: 62 mg/dL (ref >50)
LDL Cholesterol: 101 mg/dL — ABNORMAL HIGH (ref <100)
Total CHOL/HDL Ratio: 2.9 Ratio (ref <5.0)
Triglycerides: 93 mg/dL (ref <150)
VLDL: 19 mg/dL (ref <30)

## 2017-06-08 LAB — COMPREHENSIVE METABOLIC PANEL
ALT: 10 U/L (ref 6–29)
AST: 14 U/L (ref 10–35)
Albumin: 3.8 g/dL (ref 3.6–5.1)
Alkaline Phosphatase: 110 U/L (ref 33–115)
BUN: 8 mg/dL (ref 7–25)
CO2: 23 mmol/L (ref 20–31)
Calcium: 9.1 mg/dL (ref 8.6–10.2)
Chloride: 104 mmol/L (ref 98–110)
Creat: 0.56 mg/dL (ref 0.50–1.10)
Glucose, Bld: 90 mg/dL (ref 70–99)
Potassium: 4.2 mmol/L (ref 3.5–5.3)
Sodium: 139 mmol/L (ref 135–146)
Total Bilirubin: 0.5 mg/dL (ref 0.2–1.2)
Total Protein: 7.3 g/dL (ref 6.1–8.1)

## 2017-06-08 MED ORDER — ESCITALOPRAM OXALATE 10 MG PO TABS: 10.0000 mg | ORAL_TABLET | Freq: Every day | ORAL | 3 refills | Status: DC

## 2017-06-08 NOTE — Progress Notes (Signed)
Subjective:    Patient ID: Sabrina Roberts, female    DOB: [birth year], 46 y.o.   MRN: [id number]  Patient presents for CPE with PAP (is fasting)  Pt here forCPE, switched to UC in [place] for her job  Had carpal tunnel surgery since last visit , still feels like her grip strength is not quite there but pain/tingling has resolved    On vtamin d for past 2 weeks was not taking regulary before  Has not been on B12 shots since last December 2017- history of bypass surgery is to be on lifetime B12   DM- has not been taking metformin regulary,admits to stress eating at times also skipping meals    DUe for PAP Smear/Mammogram   Anxiey attacks/stress- Had increased stress over the past few months. She is the only person actively working. Her husband get some disability. She has a daughter in college to think about dropping out to get the [employer] entire family is morbidly obese and she just feels overwhelmed. She has let her wake him back on even though she's had gastric bypass in the past. At times she gets overwhelmed she's actually had panic attacks. She describes 2 different event where she felt she was having a heart attack was breathing very rapidly had pressure in her chest felt dizzy until she was able to calm down. No SI, no hallucinations. She wants to get her health back on track and help her family   LMP- 2 months , lasted 2 weeks, had not had a menses 8 months prior   Father colon cancer found age 14, now in 37's getting treatment   She also has chronic back pain, has bulging disc, requested something for pain at end of OV   Review Of Systems:  GEN- +fatigue, denies fever, weight loss,weakness, recent illness HEENT- denies eye drainage, ch ange in vision, nasal discharge, CVS- denies chest pain, palpitations RESP- denies SOB, cough, wheeze ABD- denies N/V, change in stools, abd pain GU- denies dysuria, hematuria, dribbling, incontinence MSK- +joint pain, muscle aches,  injury Neuro- denies headache, dizziness, syncope, seizure activity       Objective:    BP 128/74   Pulse 86   Temp 97.9 F (36.6 C) (Oral)   Resp 14   Ht 5\' 4"  (1.626 m)   Wt 251 lb (113.9 kg)   LMP 04/08/2017 Comment: irregular  SpO2 98%   BMI 43.08 kg/m  GEN- NAD, alert and oriented x3,obese  HEENT- PERRL, EOMI, non injected sclera, pink conjunctiva, MMM, oropharynx clear Neck- Supple, no thyromegaly Breast- normal symmetry, no nipple inversion,no nipple drainage, no nodules or lumps felt Nodes- no axillary nodes CVS- RRR, no murmur RESP-CTAB GU- normal external genitalia, vaginal mucosa pink and moist, cervix visualized no growth, no blood form os, minimal thin clear discharge, no CMT, no ovarian masses, uterus normal size Psych- overwhelmed appearing, tearful at times, no SI, normal speech, normal thought process  ABD-NABS,soft,NT,ND EXT- No edema Pulses- Radial, DP- 2+        Assessment & Plan:      Problem List Items Addressed This Visit    Morbid obesity ([place])   Borderline abnormal TFTs   Vitamin D deficiency   Relevant Orders   Vitamin D, 25-hydroxy   Irregular menses    History of irregular menses, with stress, weight Check hormone levels      Relevant Orders   TSH   FSH/LH   DM (diabetes mellitus) ([place])    Check  A1C, restart meds appropriately She has been to nutritionist with weight loss surgery and diabetes Understands what to eat but emotional eating is taking over      Relevant Orders   Hemoglobin A1c   Lipid panel   Microalbumin / creatinine urine ratio   HM DIABETES FOOT EXAM (Completed)   Depression with anxiety    Start lexapro 10mg  at bedtime F/U in office in a few weeks Discussed the side effects of medication Call for any concerns or go to ER if she worsens      Bulging of lumbar intervertebral disc    ddd AND BULGING disc with history of nerve impingement Plan to try Ultram as needed Weight loss would be very  beneficial      B12 deficiency    Plan to restart b12 injections      Relevant Orders   Vitamin B12    Other Visit Diagnoses    Routine general medical examination at a health care facility    -  Primary   CPE done, schedule mammogram, PAP smear done today, Colonoscopy age 74   Relevant Orders   CBC with Differential/Platelet   Comprehensive metabolic panel   Cervical cancer screening       Relevant Orders   PAP, ThinPrep ASCUS Rflx HPV Rflx Type   Breast cancer screening       Relevant Orders   MM DIAG BREAST TOMO BILATERAL      Note: This dictation was prepared with Dragon dictation along with smaller phrase technology. Any transcriptional errors that result from this process are unintentional.

## 2017-06-08 NOTE — Assessment & Plan Note (Addendum)
Start lexapro 10mg  at bedtime F/U in office in a few weeks Discussed the side effects of medication Call for any concerns or go to ER if she worsens

## 2017-06-08 NOTE — Assessment & Plan Note (Signed)
History of irregular menses, with stress, weight Check hormone levels

## 2017-06-08 NOTE — Assessment & Plan Note (Signed)
Plan to restart b12 injections

## 2017-06-08 NOTE — Patient Instructions (Signed)
Start lexapro at bedtime Schedule your mammogram We will call with results Schedule a eye visit F/U 4 weeks

## 2017-06-08 NOTE — Assessment & Plan Note (Signed)
ddd AND BULGING disc with history of nerve impingement Plan to try Ultram as needed Weight loss would be very beneficial

## 2017-06-08 NOTE — Assessment & Plan Note (Signed)
Check A1C, restart meds appropriately She has been to nutritionist with weight loss surgery and diabetes Understands what to eat but emotional eating is taking over

## 2017-06-08 NOTE — Telephone Encounter (Signed)
Medication called to pharmacy. 

## 2017-06-09 LAB — MICROALBUMIN / CREATININE URINE RATIO
Creatinine, Urine: 196 mg/dL (ref 20–320)
Microalb Creat Ratio: 5 mcg/mg creat (ref <30)
Microalb, Ur: 0.9 mg/dL

## 2017-06-09 LAB — VITAMIN D 25 HYDROXY (VIT D DEFICIENCY, FRACTURES): Vit D, 25-Hydroxy: 18 ng/mL — ABNORMAL LOW (ref 30–100)

## 2017-06-09 LAB — FSH/LH
FSH: 54.2 m[IU]/mL
LH: 35.6 m[IU]/mL

## 2017-06-09 LAB — VITAMIN B12: Vitamin B-12: 235 pg/mL (ref 200–1100)

## 2017-06-09 LAB — HEMOGLOBIN A1C
Hgb A1c MFr Bld: 6.6 % — ABNORMAL HIGH (ref <5.7)
Mean Plasma Glucose: 143 mg/dL

## 2017-06-10 ENCOUNTER — Ambulatory Visit: Payer: 59

## 2017-06-11 LAB — PAP THINPREP ASCUS RFLX HPV RFLX TYPE

## 2017-06-14 ENCOUNTER — Other Ambulatory Visit: Payer: Self-pay | Admitting: *Deleted

## 2017-06-14 MED ORDER — METRONIDAZOLE 500 MG PO TABS: 500.0000 mg | ORAL_TABLET | Freq: Two times a day (BID) | ORAL | 0 refills | Status: DC

## 2017-06-15 ENCOUNTER — Other Ambulatory Visit: Payer: Self-pay | Admitting: *Deleted

## 2017-06-18 ENCOUNTER — Ambulatory Visit (INDEPENDENT_AMBULATORY_CARE_PROVIDER_SITE_OTHER): Payer: 59 | Admitting: Family Medicine

## 2017-06-18 DIAGNOSIS — E538 Deficiency of other specified B group vitamins: Secondary | ICD-10-CM

## 2017-06-18 MED ORDER — CYANOCOBALAMIN 1000 MCG/ML IJ SOLN
1000.0000 ug | Freq: Once | INTRAMUSCULAR | Status: AC
  Administered 2017-06-18: 1000 ug via INTRAMUSCULAR

## 2017-06-18 NOTE — Progress Notes (Signed)
Pt came today for B12 injection. Given in Left deltoid  HQU#[id number] Lot [id number] Exp Oct 2019

## 2017-07-13 ENCOUNTER — Ambulatory Visit: Payer: 59 | Admitting: Family Medicine

## 2017-07-13 ENCOUNTER — Encounter: Payer: Self-pay | Admitting: Family Medicine

## 2017-07-15 ENCOUNTER — Other Ambulatory Visit: Payer: Self-pay | Admitting: Family Medicine

## 2017-07-15 DIAGNOSIS — Z1239 Encounter for other screening for malignant neoplasm of breast: Secondary | ICD-10-CM

## 2017-08-16 ENCOUNTER — Encounter (HOSPITAL_COMMUNITY): Payer: Self-pay | Admitting: Emergency Medicine

## 2017-08-16 ENCOUNTER — Emergency Department (HOSPITAL_COMMUNITY)
Admission: EM | Admit: 2017-08-16 | Discharge: 2017-08-16 | Disposition: A | Discharge status: Home / Self Care | Payer: 59 | Attending: Emergency Medicine | Admitting: Emergency Medicine

## 2017-08-16 DIAGNOSIS — E119 Type 2 diabetes mellitus without complications: Secondary | ICD-10-CM | POA: Insufficient documentation

## 2017-08-16 DIAGNOSIS — Z79899 Other long term (current) drug therapy: Secondary | ICD-10-CM | POA: Insufficient documentation

## 2017-08-16 DIAGNOSIS — K625 Hemorrhage of anus and rectum: Secondary | ICD-10-CM | POA: Diagnosis not present

## 2017-08-16 DIAGNOSIS — Z7984 Long term (current) use of oral hypoglycemic drugs: Secondary | ICD-10-CM | POA: Insufficient documentation

## 2017-08-16 LAB — BASIC METABOLIC PANEL
Anion gap: 6 (ref 5–15)
BUN: 11 mg/dL (ref 6–20)
CO2: 26 mmol/L (ref 22–32)
Calcium: 8.8 mg/dL — ABNORMAL LOW (ref 8.9–10.3)
Chloride: 104 mmol/L (ref 101–111)
Creatinine, Ser: 0.59 mg/dL (ref 0.44–1.00)
GFR calc Af Amer: >60 mL/min (ref >60)
GFR calc non Af Amer: >60 mL/min (ref >60)
Glucose, Bld: 95 mg/dL (ref 65–99)
Potassium: 3.8 mmol/L (ref 3.5–5.1)
Sodium: 136 mmol/L (ref 135–145)

## 2017-08-16 LAB — CBC WITH DIFFERENTIAL/PLATELET
Basophils Absolute: 0 10*3/uL (ref 0.0–0.1)
Basophils Relative: 0 %
Eosinophils Absolute: 0.2 10*3/uL (ref 0.0–0.7)
Eosinophils Relative: 2 %
HCT: 32.7 % — ABNORMAL LOW (ref 36.0–46.0)
Hemoglobin: 10.9 g/dL — ABNORMAL LOW (ref 12.0–15.0)
Lymphocytes Relative: 40 %
Lymphs Abs: 3.1 10*3/uL (ref 0.7–4.0)
MCH: 28.1 pg (ref 26.0–34.0)
MCHC: 33.3 g/dL (ref 30.0–36.0)
MCV: 84.3 fL (ref 78.0–100.0)
Monocytes Absolute: 0.6 10*3/uL (ref 0.1–1.0)
Monocytes Relative: 7 %
Neutro Abs: 4 10*3/uL (ref 1.7–7.7)
Neutrophils Relative %: 51 %
Platelets: 324 10*3/uL (ref 150–400)
RBC: 3.88 MIL/uL (ref 3.87–5.11)
RDW: 15 % (ref 11.5–15.5)
WBC: 7.9 10*3/uL (ref 4.0–10.5)

## 2017-08-16 LAB — POC OCCULT BLOOD, ED: Fecal Occult Bld: NEGATIVE

## 2017-08-16 LAB — PREGNANCY, URINE: Preg Test, Ur: NEGATIVE

## 2017-08-16 MED ORDER — ACETAMINOPHEN 325 MG PO TABS
650.0000 mg | ORAL_TABLET | Freq: Once | ORAL | Status: AC
  Administered 2017-08-16: 650 mg via ORAL
  Filled 2017-08-16: qty 2

## 2017-08-16 NOTE — ED Notes (Signed)
ED Provider at bedside. 

## 2017-08-16 NOTE — ED Provider Notes (Signed)
[facility] DEPT Provider Note   CSN: [id number] Arrival date & time: 08/16/17  1231     History   Chief Complaint Chief Complaint  Patient presents with  . Rectal Bleeding    HPI Sabrina Roberts is a 46 y.o. female.  HPI Patient presents with a Donnell pain and rectal bleeding. Again yesterday. Pain is crampy. Has had some red blood. Had episodes of it yesterday and then again today. Seems to be around the stool without necessarily blood in the stool. No other bleeding. Does have a history of anemia. Is asking for some Tylenol for the pain. Not on blood thinners. States she's been having to eat a lot of ice recently. Had some shortness of breath. Has generalized fatigue states she had more trouble going up and down the stairs.  Past Medical History:  Diagnosis Date  . Anxiety   . Arthritis   . Diabetes mellitus   . Hyperlipidemia   . Sleep apnea    Has CPAP but has lost weight and hasnt used    Patient Active Problem List   Diagnosis Date Noted  . B12 deficiency 06/08/2017  . Vitamin D deficiency 06/08/2017  . Depression with anxiety 06/08/2017  . Bulging of lumbar intervertebral disc 06/08/2017  . Carpal tunnel syndrome of right wrist   . Carpal tunnel syndrome 04/20/2016  . Borderline abnormal TFTs 10/29/2014  . Irregular menses 08/01/2013  . Pain in joint, ankle and foot 08/01/2013  . Stress reaction 10/14/2012  . Insomnia 10/14/2012  . Gynecological examination 05/03/2012  . Shoulder pain 05/03/2012  . DM (diabetes mellitus) ([place]) 02/28/2012  . Hyperlipidemia 02/28/2012  . Morbid obesity ([place]) 02/28/2012  . Thyroid nodule 02/28/2012    Past Surgical History:  Procedure Laterality Date  . CARPAL TUNNEL RELEASE Right 10/01/2016   Procedure: RIGHT CARPAL TUNNEL RELEASE;  Surgeon: Carole Civil, MD;  Location: [facility];  Service: Orthopedics;  Laterality: Right;  . CARPAL TUNNEL RELEASE Left 10/22/2016   Procedure: CARPAL TUNNEL RELEASE;  Surgeon: Carole Civil, MD;  Location: [facility];  Service: Orthopedics;  Laterality: Left;  . CESAREAN SECTION  2002  . CHOLECYSTECTOMY  dec 2012  . GASTRIC BYPASS  dec 2012  . ORTHOPEDIC SURGERY Right    knee arthroscopy    OB History    No data available       Home Medications    Prior to Admission medications   Medication Sig Start Date End Date Taking? Authorizing Provider  Cholecalciferol (VITAMIN D3) 10000 units capsule Take 10,000 Units by mouth every 7 (seven) days.   Yes [provider]  escitalopram (LEXAPRO) 10 MG tablet Take 1 tablet (10 mg total) by mouth at bedtime. 06/08/17  Yes Pevely, Modena Nunnery, MD  metFORMIN (GLUCOPHAGE) 500 MG tablet TAKE ONE TABLET BY MOUTH ONCE DAILY WITH BREAKFAST 01/01/17  Yes Lucky, Modena Nunnery, MD  Multiple Vitamin (MULTIVITAMIN) tablet Take 1 tablet by mouth daily.    Yes [provider]  naproxen sodium (ANAPROX) 220 MG tablet Take 440 mg by mouth daily as needed (pain).   Yes [provider]  promethazine (PHENERGAN) 12.5 MG tablet Take 1 tablet (12.5 mg total) by mouth every 4 (four) hours as needed for nausea or vomiting. 10/07/16  Yes Carole Civil, MD  Syringe, Disposable, 3 ML MISC Use to inject Vit B12 IM Q month 04/22/16  Yes Parkman, Modena Nunnery, MD  zolpidem (AMBIEN) 10 MG tablet TAKE ONE TABLET BY MOUTH AT BEDTIME  AS NEEDED 06/08/17  Yes [place], Modena Nunnery, MD  Blood Glucose Monitoring Suppl (BLOOD GLUCOSE SYSTEM PAK) KIT Please dispense based on patient and insurance preference. Use as directed to monitor FSBS 1x daily. Dx: E11.9. 04/22/16   Alycia Rossetti, MD  Glucose Blood (BLOOD GLUCOSE TEST STRIPS) STRP Please dispense based on patient and insurance preference. Use as directed to monitor FSBS 1x daily. Dx: E11.9. 04/22/16   Alycia Rossetti, MD  Lancet Devices MISC Please dispense based on patient and insurance preference. Use as directed to monitor FSBS 1x daily. Dx: E11.9. 04/22/16   Alycia Rossetti, MD  Lancets  MISC Please dispense based on patient and insurance preference. Use as directed to monitor FSBS 1x daily. Dx: E11.9. 04/22/16   Alycia Rossetti, MD  NEEDLE, DISP, 25 G 25G X 1" MISC Use to inject Vit B12 IM Q month 04/22/16   Alycia Rossetti, MD    Family History Family History  Problem Relation Age of Onset  . Hypertension Mother   . Depression Mother   . Diabetes Mother   . Hypertension Father   . Hyperlipidemia Father   . Cancer Paternal Grandmother   . Cancer Paternal Grandfather     Social History Social History  Substance Use Topics  . Smoking status: Never Smoker  . Smokeless tobacco: Never Used  . Alcohol use 0.0 oz/week     Comment: rarely     Allergies   Actos [pioglitazone hydrochloride] and Latex   Review of Systems Review of Systems  Constitutional: Positive for fatigue. Negative for appetite change and unexpected weight change.  Respiratory: Negative for shortness of breath.   Gastrointestinal: Positive for abdominal pain and anal bleeding. Negative for constipation, diarrhea and nausea.  Musculoskeletal: Negative for back pain.  Skin: Negative for rash.  Psychiatric/Behavioral: Negative for confusion.     Physical Exam Updated Vital Signs BP 131/68 (BP Location: Right Arm)   Pulse 75   Temp 98 F (36.7 C) (Oral)   Resp 17   Ht _0  (1.626 m)   Wt 113.4 kg (250 lb)   LMP 06/16/2017   SpO2 100%   BMI 42.91 kg/m   Physical Exam  Constitutional: She appears well-developed.  HENT:  Head: Normocephalic.  Eyes:  Conjunctiva are somewhat pale  Neck: Neck supple.  Cardiovascular: Normal rate.   Pulmonary/Chest: Effort normal.  Abdominal: There is no tenderness.  Musculoskeletal: She exhibits no edema.  Neurological: She is alert.  Skin: Skin is warm. Capillary refill takes less than 2 seconds.     ED Treatments / Results  Labs (all labs ordered are listed, but only abnormal results are displayed) Labs Reviewed  CBC WITH  DIFFERENTIAL/PLATELET - Abnormal; Notable for the following:       Result Value   Hemoglobin 10.9 (*)    HCT 32.7 (*)    All other components within normal limits  BASIC METABOLIC PANEL - Abnormal; Notable for the following:    Calcium 8.8 (*)    All other components within normal limits  PREGNANCY, URINE  POC OCCULT BLOOD, ED    EKG  EKG Interpretation None       Radiology No results found.  Procedures Procedures (including critical care time)  Medications Ordered in ED Medications  acetaminophen (TYLENOL) tablet 650 mg (650 mg Oral Given 08/16/17 1314)     Initial Impression / Assessment and Plan / ED Course  I have reviewed the triage vital signs and the nursing notes.  Pertinent labs & imaging results that were available during my care of the patient were reviewed by me and considered in my medical decision making (see chart for details).     Patient with reported GI bleeding. No bleeding on my rectal exam. Did have a small posterior fissure. No gross blood or guaiac positive. Hemoglobin is slightly below her baseline. Normocytic. Has however had some eating ice. Will need follow-up with her primary care doctor. However think it is reasonable for her to be discharged. Follow-up with gastroenterology also. Being normocytic will not empirically start iron, particularly since the patient has had some issues with it in the past.  Final Clinical Impressions(s) / ED Diagnoses   Final diagnoses:  Rectal bleeding    New Prescriptions New Prescriptions   No medications on file     Davonna Belling, MD 08/16/17 1425

## 2017-08-16 NOTE — ED Triage Notes (Signed)
Patient complaining of lower abdominal pain yesterday and rectal bleeding last night. States today there is more rectal bleeding than yesterday.

## 2017-08-16 NOTE — Discharge Instructions (Signed)
Follow-up with your doctors for further evaluation of her bleeding and fatigue. Return for worsening lightheadedness or dizziness or increased bleeding.

## 2017-08-23 ENCOUNTER — Telehealth: Payer: Self-pay | Admitting: Gastroenterology

## 2017-08-23 NOTE — Telephone Encounter (Signed)
Pt was seen in the ER on 8/20 and recommended to follow up with [place]. Please advise if can accept her as a new patient.

## 2017-08-25 ENCOUNTER — Ambulatory Visit: Payer: 59 | Admitting: Gastroenterology

## 2017-08-25 ENCOUNTER — Encounter: Payer: Self-pay | Admitting: Gastroenterology

## 2017-08-25 NOTE — Telephone Encounter (Signed)
Ok to schedule.

## 2017-09-06 ENCOUNTER — Encounter: Payer: Self-pay | Admitting: Family Medicine

## 2017-09-06 ENCOUNTER — Ambulatory Visit (INDEPENDENT_AMBULATORY_CARE_PROVIDER_SITE_OTHER): Payer: 59 | Admitting: Family Medicine

## 2017-09-06 ENCOUNTER — Ambulatory Visit: Payer: 59 | Admitting: Family Medicine

## 2017-09-06 VITALS — BP 110/72 | HR 82 | Temp 98.9°F | Resp 12 | Ht 64.5 in | Wt 252.0 lb

## 2017-09-06 DIAGNOSIS — F418 Other specified anxiety disorders: Secondary | ICD-10-CM | POA: Diagnosis not present

## 2017-09-06 DIAGNOSIS — M545 Low back pain, unspecified: Secondary | ICD-10-CM

## 2017-09-06 DIAGNOSIS — R252 Cramp and spasm: Secondary | ICD-10-CM | POA: Diagnosis not present

## 2017-09-06 DIAGNOSIS — E119 Type 2 diabetes mellitus without complications: Secondary | ICD-10-CM | POA: Diagnosis not present

## 2017-09-06 DIAGNOSIS — G8929 Other chronic pain: Secondary | ICD-10-CM

## 2017-09-06 DIAGNOSIS — D519 Vitamin B12 deficiency anemia, unspecified: Secondary | ICD-10-CM

## 2017-09-06 DIAGNOSIS — E538 Deficiency of other specified B group vitamins: Secondary | ICD-10-CM

## 2017-09-06 DIAGNOSIS — M5124 Other intervertebral disc displacement, thoracic region: Secondary | ICD-10-CM | POA: Diagnosis not present

## 2017-09-06 DIAGNOSIS — D649 Anemia, unspecified: Secondary | ICD-10-CM | POA: Insufficient documentation

## 2017-09-06 DIAGNOSIS — K625 Hemorrhage of anus and rectum: Secondary | ICD-10-CM

## 2017-09-06 DIAGNOSIS — Z23 Encounter for immunization: Secondary | ICD-10-CM

## 2017-09-06 DIAGNOSIS — F5101 Primary insomnia: Secondary | ICD-10-CM

## 2017-09-06 DIAGNOSIS — M549 Dorsalgia, unspecified: Secondary | ICD-10-CM | POA: Insufficient documentation

## 2017-09-06 MED ORDER — ZOLPIDEM TARTRATE 10 MG PO TABS: 10.0000 mg | ORAL_TABLET | Freq: Every evening | ORAL | 3 refills | Status: DC | PRN

## 2017-09-06 MED ORDER — CYANOCOBALAMIN 1000 MCG/ML IJ SOLN
1000.0000 ug | Freq: Once | INTRAMUSCULAR | Status: AC
  Administered 2017-09-06: 1000 ug via INTRAMUSCULAR

## 2017-09-06 MED ORDER — TRAMADOL HCL 50 MG PO TABS: 50.0000 mg | ORAL_TABLET | Freq: Three times a day (TID) | ORAL | 0 refills | Status: DC | PRN

## 2017-09-06 NOTE — Assessment & Plan Note (Signed)
We'll recheck her A1c with her significant fatigue and symptoms make sure her blood sugars control with the metformin

## 2017-09-06 NOTE — Progress Notes (Signed)
Subjective:    Patient ID: Sabrina Roberts, female    DOB: February 11, 1971, 46 y.o.   MRN: [id number]  Patient presents for cramps in leg (about 1 month ); Fatigue; and Rectal Bleeding (about 1 month ) Patient here to follow-up   Physical exam she had multiple issues was not taking her medicines as prescribed. Diabetes mellitus her A1c was up to 6.6% was recommended that she restart her metformin therapyHowever she has not been checking her blood sugar  Anemia/B-12 deficiency status post bariatric surgery in the past recommended that she restart her monthly injections of B-12 thousand micrograms.  Depression and anxiety she was started on Lexapro 10 mg, she states to have a lot of stress she is working 2 different jobs long hours. She's not been sleeping well and she lost her Ambien and has not slept well in the past month. She knows that she is able pick the supplements well she cannot afford to pay out of pocket therefore did not alert [place]. Her panic attacks and anxiety attacks in general have improved with the Lexapro but her body just feels severely fatigued. She's been having increased restless legs cramping in her legs mostly at nighttime which she has had in the past as well. She notes she is still not eating well and eating the wrong foods and continues to gain weight.  She has since been to the emergency room secondary to rectal bleeding, this lasted for about a week. Her hemoglobin dropped from 11.1-10.9. She was told is most likely internal hemorrhoids. She uses stool softener but feels like her bowels have been fairly regular. She has not had any further bleeding or rectal pain.   Chronic back pain she was given tramadol last visit. She has history of a large herniated disc at T11-T12 as well as some degenerative disc disease noted on MRI back in 2005. She has had progressive back pain since then.She does not recall picking up the tramadol but has used some Aleve for back flares up right now  and is doing fairly well.  Review Of Systems:  GEN- denies fatigue, fever, weight loss,weakness, recent illness HEENT- denies eye drainage, change in vision, nasal discharge, CVS- denies chest pain, palpitations RESP- denies SOB, cough, wheeze ABD- denies N/V, change in stools, abd pain GU- denies dysuria, hematuria, dribbling, incontinence MSK- denies joint pain, muscle aches, injury Neuro- denies headache, dizziness, syncope, seizure activity       Objective:    BP 110/72   Pulse 82   Temp 98.9 F (37.2 C) (Oral)   Resp 12   Ht 5' 4.5" (1.638 m)   Wt 252 lb (114.3 kg)   SpO2 98%   BMI 42.59 kg/m  GEN- NAD, alert and oriented x3,obese  HEENT- PERRL, EOMI, non injected sclera, pink conjunctiva, MMM, oropharynx clear Neck- Supple, no thyromegaly CVS- RRR, no murmur RESP-CTAB ABD-NABS,soft,NT,ND Psych- normal affect and mood  EXT- No edema Pulses- Radial, DP- 2+        Assessment & Plan:      Problem List Items Addressed This Visit      Unprioritized   Insomnia   B12 deficiency   Relevant Medications   cyanocobalamin ((VITAMIN B-12)) injection 1,000 mcg (Completed)   Other Relevant Orders   Vitamin B12   Thoracic disc herniation   DM (diabetes mellitus) ([place]) - Primary    We'll recheck her A1c with her significant fatigue and symptoms make sure her blood sugars control with the metformin  Relevant Orders   Basic metabolic panel   Hemoglobin A1c   Depression with anxiety    Chronic insomnia working long hours in the setting of her stressors. On the continue the Lexapro 10 mg as that has actually helped the anxiety and panicky symptoms however still focal to tell with the fatigue that she is not sleeping she's been out of her sleep aid. She is going to restart this along with the Lexapro we will see how her symptoms do before making any other adjustment.      Chronic back pain    Tramadol has been sent then to use for significant flares       Relevant Medications   traMADol (ULTRAM) 50 MG tablet   Anemia    She may have more been mixed anemia picture therefore we'll check in our levels well. She has no B-12 deficiency we did give her another B-12 shot today.      Relevant Medications   cyanocobalamin ((VITAMIN B-12)) injection 1,000 mcg (Completed)   Other Relevant Orders   CBC with Differential/Platelet   Iron, TIBC and Ferritin Panel    Other Visit Diagnoses    Leg cramps       Relevant Orders   Magnesium   Flu vaccine need       Relevant Orders   Flu Vaccine QUAD 36+ mos IM (Completed)   Rectal bleeding       now resolved, likley hemorroidal no pain associated, recheck Hb, see if GI needed at this time      Note: This dictation was prepared with Dragon dictation along with smaller phrase technology. Any transcriptional errors that result from this process are unintentional.

## 2017-09-06 NOTE — Assessment & Plan Note (Signed)
She may have more been mixed anemia picture therefore we'll check in our levels well. She has no B-12 deficiency we did give her another B-12 shot today.

## 2017-09-06 NOTE — Assessment & Plan Note (Signed)
Tramadol has been sent then to use for significant flares

## 2017-09-06 NOTE — Patient Instructions (Signed)
We will send message via mychart B12 shot and flu shot given  F/U pending results

## 2017-09-06 NOTE — Assessment & Plan Note (Signed)
Chronic insomnia working long hours in the setting of her stressors. On the continue the Lexapro 10 mg as that has actually helped the anxiety and panicky symptoms however still focal to tell with the fatigue that she is not sleeping she's been out of her sleep aid. She is going to restart this along with the Lexapro we will see how her symptoms do before making any other adjustment.

## 2017-09-07 LAB — BASIC METABOLIC PANEL WITH GFR
BUN: 13 mg/dL (ref 7–25)
CO2: 25 mmol/L (ref 20–32)
Calcium: 8.8 mg/dL (ref 8.6–10.2)
Chloride: 108 mmol/L (ref 98–110)
Creat: 0.57 mg/dL (ref 0.50–1.10)
GFR, Est African American: 129 mL/min/{1.73_m2} (ref >=60)
GFR, Est Non African American: 111 mL/min/{1.73_m2} (ref >=60)
Glucose, Bld: 102 mg/dL — ABNORMAL HIGH (ref 65–99)
Potassium: 4.2 mmol/L (ref 3.5–5.3)
Sodium: 140 mmol/L (ref 135–146)

## 2017-09-07 LAB — CBC WITH DIFFERENTIAL/PLATELET
Basophils Absolute: 51 cells/uL (ref 0–200)
Basophils Relative: 0.6 %
Eosinophils Absolute: 162 cells/uL (ref 15–500)
Eosinophils Relative: 1.9 %
HCT: 35.1 % (ref 35.0–45.0)
Hemoglobin: 11.4 g/dL — ABNORMAL LOW (ref 11.7–15.5)
Lymphs Abs: 2448 cells/uL (ref 850–3900)
MCH: 27.8 pg (ref 27.0–33.0)
MCHC: 32.5 g/dL (ref 32.0–36.0)
MCV: 85.6 fL (ref 80.0–100.0)
MPV: 12.2 fL (ref 7.5–12.5)
Monocytes Relative: 7.8 %
Neutro Abs: 5177 cells/uL (ref 1500–7800)
Neutrophils Relative %: 60.9 %
Platelets: 361 10*3/uL (ref 140–400)
RBC: 4.1 10*6/uL (ref 3.80–5.10)
RDW: 14.4 % (ref 11.0–15.0)
Total Lymphocyte: 28.8 %
WBC mixed population: 663 cells/uL (ref 200–950)
WBC: 8.5 10*3/uL (ref 3.8–10.8)

## 2017-09-07 LAB — HEMOGLOBIN A1C
Hgb A1c MFr Bld: 6.5 % of total Hgb — ABNORMAL HIGH (ref <5.7)
Mean Plasma Glucose: 140 (calc)
eAG (mmol/L): 7.7 (calc)

## 2017-09-07 LAB — IRON,TIBC AND FERRITIN PANEL
%SAT: 14 % (calc) (ref 11–50)
Ferritin: 6 ng/mL — ABNORMAL LOW (ref 10–232)
Iron: 56 ug/dL (ref 40–190)
TIBC: 396 mcg/dL (calc) (ref 250–450)

## 2017-09-07 LAB — MAGNESIUM: Magnesium: 1.7 mg/dL (ref 1.5–2.5)

## 2017-09-07 LAB — VITAMIN B12: Vitamin B-12: >2000 pg/mL — ABNORMAL HIGH (ref 200–1100)

## 2017-09-10 ENCOUNTER — Other Ambulatory Visit: Payer: Self-pay | Admitting: Family Medicine

## 2017-09-10 ENCOUNTER — Ambulatory Visit: Payer: 59 | Admitting: Family Medicine

## 2017-09-10 DIAGNOSIS — K625 Hemorrhage of anus and rectum: Secondary | ICD-10-CM

## 2017-09-10 NOTE — Progress Notes (Signed)
Pts still having episodes of rectal bleeding Send to GI Hb stable

## 2017-10-05 ENCOUNTER — Other Ambulatory Visit: Payer: Self-pay | Admitting: Family Medicine

## 2017-10-05 MED ORDER — VITAMIN D 50 MCG (2000 UT) PO CAPS: 2000.0000 [IU] | ORAL_CAPSULE | Freq: Every day | ORAL | 11 refills | Status: DC

## 2017-10-08 ENCOUNTER — Ambulatory Visit: Payer: 59 | Admitting: Gastroenterology

## 2017-10-12 ENCOUNTER — Other Ambulatory Visit: Payer: Self-pay | Admitting: Family Medicine

## 2017-10-26 ENCOUNTER — Encounter: Payer: Self-pay | Admitting: Gastroenterology

## 2017-11-04 ENCOUNTER — Ambulatory Visit (INDEPENDENT_AMBULATORY_CARE_PROVIDER_SITE_OTHER): Payer: 59 | Admitting: Gastroenterology

## 2017-11-04 ENCOUNTER — Encounter: Payer: Self-pay | Admitting: Gastroenterology

## 2017-11-04 VITALS — BP 110/62 | HR 76 | Ht 64.0 in | Wt 254.8 lb

## 2017-11-04 DIAGNOSIS — K625 Hemorrhage of anus and rectum: Secondary | ICD-10-CM | POA: Insufficient documentation

## 2017-11-04 DIAGNOSIS — R1084 Generalized abdominal pain: Secondary | ICD-10-CM

## 2017-11-04 DIAGNOSIS — R197 Diarrhea, unspecified: Secondary | ICD-10-CM

## 2017-11-04 MED ORDER — NA SULFATE-K SULFATE-MG SULF 17.5-3.13-1.6 GM/177ML PO SOLN: ORAL | 0 refills | Status: DC

## 2017-11-04 MED ORDER — DICYCLOMINE HCL 20 MG PO TABS: ORAL_TABLET | ORAL | 2 refills | Status: DC

## 2017-11-04 NOTE — Progress Notes (Signed)
11/04/2017 Sabrina Roberts [id number] 1971-02-24   HISTORY OF PRESENT ILLNESS: This is a 46 year old female who is new to our office and has been referred here by her PCP, Dr. Buelah Manis, for evaluation regarding diarrhea, rectal bleeding, abdominal pain.  She says that she's had some loose postprandial stools since she had her gallbladder removed in 2012, but over the last few months the diarrhea has seemed to worsen.  Also, starting about August she has been seeing red colored blood in her stools.  She says that the blood is actually mixed in with her stools.  She says that this has been occurring on and off but when it occurs it is a lot of blood.  She describes it as bright red.  She knows that she does have some hemorrhoids.  She complains of diffuse abdominal cramping and also a burning sensation up inside her rectum.  She denies any external anal type pain.  Sometimes has sensation that she needs to move her bowels, but then she has to sit on the toilet and nothing happens, but then it is usually followed by some explosive diarrhea a short time later.  Also complains of a lot of gas and bloating.  Recent CBC showed a mildly low hemoglobin of 11.4 g.  Iron studies are low and it was recommended that she begin taking a daily iron supplement in the form of ferrous sulfate 325 mg by her PCP, but she has not yet started that.  BMP and magnesium levels were normal.   Past Medical History:  Diagnosis Date  . Anemia   . Anxiety   . Arthritis   . Depression   . Diabetes mellitus   . Hyperlipidemia   . Hypertension   . Obesity   . Sleep apnea    Has CPAP but has lost weight and hasnt used   Past Surgical History:  Procedure Laterality Date  . CESAREAN SECTION  2002  . CHOLECYSTECTOMY  dec 2012  . GASTRIC BYPASS  dec 2012  . KNEE ARTHROSCOPY Right     reports that  has never smoked. she has never used smokeless tobacco. She reports that she drinks alcohol. She reports that she does not use  drugs. family history includes Breast cancer in her maternal aunt; Cancer in her paternal grandmother; Colon cancer in her other and paternal grandfather; Colon polyps in her father and paternal aunt; Depression in her mother; Diabetes in her maternal grandmother and mother; Diverticulitis in her father; Heart disease in her father and mother; Hyperlipidemia in her father; Hypertension in her father and mother; Irritable bowel syndrome in her mother; Kidney Stones in her father; Prostate cancer in her father; Stomach cancer in her paternal uncle; Stroke in her mother. Allergies  Allergen Reactions  . Actos [Pioglitazone Hydrochloride] Anaphylaxis, Hives and Other (See Comments)    Chest pain   . Latex Rash      Outpatient Encounter Medications as of 11/04/2017  Medication Sig  . Blood Glucose Monitoring Suppl (BLOOD GLUCOSE SYSTEM PAK) KIT Please dispense based on patient and insurance preference. Use as directed to monitor FSBS 1x daily. Dx: E11.9.  Marland Kitchen Cholecalciferol (VITAMIN D) 2000 units CAPS Take 1 capsule (2,000 Units total) by mouth daily.  Marland Kitchen escitalopram (LEXAPRO) 10 MG tablet TAKE 1 TABLET BY MOUTH ONCE DAILY AT BEDTIME  . Glucose Blood (BLOOD GLUCOSE TEST STRIPS) STRP Please dispense based on patient and insurance preference. Use as directed to monitor FSBS 1x daily. Dx: E11.9.  Marland Kitchen  Lancet Devices MISC Please dispense based on patient and insurance preference. Use as directed to monitor FSBS 1x daily. Dx: E11.9.  . Lancets MISC Please dispense based on patient and insurance preference. Use as directed to monitor FSBS 1x daily. Dx: E11.9.  . metFORMIN (GLUCOPHAGE) 500 MG tablet TAKE ONE TABLET BY MOUTH ONCE DAILY WITH BREAKFAST  . Multiple Vitamin (MULTIVITAMIN) tablet Take 1 tablet by mouth daily.   . naproxen sodium (ANAPROX) 220 MG tablet Take 440 mg by mouth daily as needed (pain).  Marland Kitchen NEEDLE, DISP, 25 G 25G X 1" MISC Use to inject Vit B12 IM Q month  . promethazine (PHENERGAN) 12.5 MG  tablet Take 1 tablet (12.5 mg total) by mouth every 4 (four) hours as needed for nausea or vomiting.  . Syringe, Disposable, 3 ML MISC Use to inject Vit B12 IM Q month  . zolpidem (AMBIEN) 10 MG tablet Take 1 tablet (10 mg total) by mouth at bedtime as needed.  . traMADol (ULTRAM) 50 MG tablet Take 1 tablet (50 mg total) by mouth every 8 (eight) hours as needed. (Patient not taking: Reported on 11/04/2017)   No facility-administered encounter medications on file as of 11/04/2017.      REVIEW OF SYSTEMS  : All other systems reviewed and negative except where noted in the History of Present Illness.   PHYSICAL EXAM: BP 110/62   Pulse 76   Ht '5\' 4"'  (1.626 m)   Wt 254 lb 12.8 oz (115.6 kg)   BMI 43.74 kg/m  General: Well developed black female in no acute distress Head: Normocephalic and atraumatic Eyes:  Sclerae anicteric, conjunctiva pink. Ears: Normal auditory acuity Lungs: Clear throughout to auscultation; no increased WOB. Heart: Regular rate and rhythm; no M/R/G. Abdomen: Soft, non-distended.  BS present.  Mild diffuse TTP > on the left. Rectal:  Will be done at the time of colonoscopy. Musculoskeletal: Symmetrical with no gross deformities  Skin: No lesions on visible extremities Extremities: No edema  Neurological: Alert oriented x 4, grossly non-focal Psychological:  Alert and cooperative. Normal mood and affect  ASSESSMENT AND PLAN: *46 year old female with diarrhea, rectal bleeding, and diffuse abdominal pain.  While she could certainly have bile salt related diarrhea and possibly some diarrhea from metformin along with hemorrhoidal bleeding, I am also somewhat concerned about ulcerative colitis.  We are scheduling the patient for colonoscopy for further evaluation.  In the interim she will try Bentyl 20 mg twice daily for the abdominal cramping and can continue to use Imodium as needed.   **The risks, benefits, and alternatives to colonoscopy were discussed with the patient  and she consents to proceed.    CC:  [place], Modena Nunnery, MD

## 2017-11-04 NOTE — Progress Notes (Signed)
Agree with assessment and plan as outlined.  

## 2017-11-04 NOTE — Patient Instructions (Signed)
We have sent the following medications to your pharmacy for you to pick up at your convenience: [place] 1. Dicyclomine ( Bentyl) 20 mg 2. Suprep  Get over the counter Ferrous Sulfate 325 mg ( Iron Supplement ). Take 1 tablet dailya t bedtime.   You have been scheduled for a colonoscopy. Please follow written instructions given to you at your visit today.  Please pick up your prep supplies at the pharmacy within the next 1-3 days. If you use inhalers (even only as needed), please bring them with you on the day of your procedure. Your physician has requested that you go to www.startemmi.com and enter the access code given to you at your visit today. This web site gives a general overview about your procedure. However, you should still follow specific instructions given to you by our office regarding your preparation for the procedure.

## 2017-11-11 ENCOUNTER — Telehealth: Payer: 59 | Admitting: Physician Assistant

## 2017-11-11 DIAGNOSIS — J208 Acute bronchitis due to other specified organisms: Secondary | ICD-10-CM | POA: Diagnosis not present

## 2017-11-11 DIAGNOSIS — B9689 Other specified bacterial agents as the cause of diseases classified elsewhere: Secondary | ICD-10-CM | POA: Diagnosis not present

## 2017-11-11 MED ORDER — DOXYCYCLINE HYCLATE 100 MG PO TABS: 100.0000 mg | ORAL_TABLET | Freq: Two times a day (BID) | ORAL | 0 refills | Status: DC

## 2017-11-11 NOTE — Progress Notes (Signed)
We are sorry that you are not feeling well.  Here is how we plan to help!  Based on your presentation I believe you most likely have A cough due to bacteria.  When patients have a fever and a productive cough with a change in color or increased sputum production, we are concerned about bacterial bronchitis.  If left untreated it can progress to pneumonia.  If your symptoms do not improve with your treatment plan it is important that you contact your provider.   I have prescribed Doxycycline 100 mg twice a day for 7 days . This is a tiny pill so you should be able to take it with ease!   In addition you may use A non-prescription cough medication called Delsym: take 2 teaspoons every 12 hours.    From your responses in the eVisit questionnaire you describe inflammation in the upper respiratory tract which is causing a significant cough.  This is commonly called Bronchitis and has four common causes:    Allergies  Viral Infections  Acid Reflux  Bacterial Infection Allergies, viruses and acid reflux are treated by controlling symptoms or eliminating the cause. An example might be a cough caused by taking certain blood pressure medications. You stop the cough by changing the medication. Another example might be a cough caused by acid reflux. Controlling the reflux helps control the cough.  USE OF BRONCHODILATOR ("RESCUE") INHALERS: There is a risk from using your bronchodilator too frequently.  The risk is that over-reliance on a medication which only relaxes the muscles surrounding the breathing tubes can reduce the effectiveness of medications prescribed to reduce swelling and congestion of the tubes themselves.  Although you feel brief relief from the bronchodilator inhaler, your asthma may actually be worsening with the tubes becoming more swollen and filled with mucus.  This can delay other crucial treatments, such as oral steroid medications. If you need to use a bronchodilator inhaler daily,  several times per day, you should discuss this with your provider.  There are probably better treatments that could be used to keep your asthma under control.     HOME CARE . Only take medications as instructed by your medical team. . Complete the entire course of an antibiotic. . Drink plenty of fluids and get plenty of rest. . Avoid close contacts especially the very young and the elderly . Cover your mouth if you cough or cough into your sleeve. . Always remember to wash your hands . A steam or ultrasonic humidifier can help congestion.   GET HELP RIGHT AWAY IF: . You develop worsening fever. . You become short of breath . You cough up blood. . Your symptoms persist after you have completed your treatment plan MAKE SURE YOU   Understand these instructions.  Will watch your condition.  Will get help right away if you are not doing well or get worse.  Your e-visit answers were reviewed by a board certified advanced clinical practitioner to complete your personal care plan.  Depending on the condition, your plan could have included both over the counter or prescription medications. If there is a problem please reply  once you have received a response from your provider. Your safety is important to [place].  If you have drug allergies check your prescription carefully.    You can use MyChart to ask questions about today's visit, request a non-urgent call back, or ask for a work or school excuse for 24 hours related to this e-Visit. If it has  greater than 24 hours you will need to follow up with your provider, or enter a new e-Visit to address those concerns. You will get an e-mail in the next two days asking about your experience.  I hope that your e-visit has been valuable and will speed your recovery. Thank you for using e-visits.   

## 2017-11-16 ENCOUNTER — Encounter: Payer: Self-pay | Admitting: Gastroenterology

## 2017-11-16 ENCOUNTER — Ambulatory Visit (AMBULATORY_SURGERY_CENTER): Payer: 59 | Admitting: Gastroenterology

## 2017-11-16 ENCOUNTER — Telehealth: Payer: Self-pay

## 2017-11-16 VITALS — BP 122/79 | HR 59 | Temp 96.4°F | Resp 13 | Ht 64.0 in | Wt 254.0 lb

## 2017-11-16 DIAGNOSIS — K599 Functional intestinal disorder, unspecified: Secondary | ICD-10-CM | POA: Diagnosis not present

## 2017-11-16 DIAGNOSIS — R197 Diarrhea, unspecified: Secondary | ICD-10-CM

## 2017-11-16 DIAGNOSIS — E119 Type 2 diabetes mellitus without complications: Secondary | ICD-10-CM | POA: Diagnosis not present

## 2017-11-16 DIAGNOSIS — D120 Benign neoplasm of cecum: Secondary | ICD-10-CM | POA: Diagnosis not present

## 2017-11-16 DIAGNOSIS — K635 Polyp of colon: Secondary | ICD-10-CM | POA: Diagnosis not present

## 2017-11-16 DIAGNOSIS — R109 Unspecified abdominal pain: Secondary | ICD-10-CM | POA: Diagnosis not present

## 2017-11-16 DIAGNOSIS — K625 Hemorrhage of anus and rectum: Secondary | ICD-10-CM

## 2017-11-16 MED ORDER — SODIUM CHLORIDE 0.9 % IV SOLN: 500.0000 mL | INTRAVENOUS | Status: DC

## 2017-11-16 NOTE — Progress Notes (Signed)
Report given to PACU, vss 

## 2017-11-16 NOTE — Telephone Encounter (Signed)
-----   Message from Yetta Flock, MD sent at 11/16/2017 11:41 AM EST ----- Regarding: hemorrhoid banding Hi Jan, Can you schedule this patient for routine hemorrhoid banding as well? Thanks

## 2017-11-16 NOTE — Progress Notes (Signed)
Called to room to assist during endoscopic procedure.  Patient ID and intended procedure confirmed with present staff. Received instructions for my participation in the procedure from the performing physician.  

## 2017-11-16 NOTE — Telephone Encounter (Signed)
LM for pt to call back to schedule appt with Dr. Loni Muse for Banding

## 2017-11-16 NOTE — Telephone Encounter (Signed)
Hemorrhoid banding scheduled for 12/02/17 @ 4pm

## 2017-11-16 NOTE — Progress Notes (Signed)
Pt's states no medical or surgical changes since previsit or office visit. 

## 2017-11-16 NOTE — Op Note (Signed)
[place] Patient Name: Sabrina Roberts Procedure Date: 11/16/2017 8:23 AM MRN: [id number] Endoscopist: Remo Lipps P. Meleena Munroe MD, MD Age: 46 Referring MD:  Date of Birth: 03-Sep-1971 Gender: Female Account #: [id number] Procedure:                Colonoscopy Indications:              Chronic diarrhea, Hematochezia Medicines:                Monitored Anesthesia Care Procedure:                Pre-Anesthesia Assessment:                           - Prior to the procedure, a History and Physical                            was performed, and patient medications and                            allergies were reviewed. The patient's tolerance of                            previous anesthesia was also reviewed. The risks                            and benefits of the procedure and the sedation                            options and risks were discussed with the patient.                            All questions were answered, and informed consent                            was obtained. Prior Anticoagulants: The patient has                            taken no previous anticoagulant or antiplatelet                            agents. ASA Grade Assessment: III - A patient with                            severe systemic disease. After reviewing the risks                            and benefits, the patient was deemed in                            satisfactory condition to undergo the procedure.                           After obtaining informed consent, the colonoscope  was passed under direct vision. Throughout the                            procedure, the patient's blood pressure, pulse, and                            oxygen saturations were monitored continuously. The                            Colonoscope was introduced through the anus and                            advanced to the the terminal ileum, with                            identification of the  appendiceal orifice and IC                            valve. The colonoscopy was performed without                            difficulty. The patient tolerated the procedure                            well. The quality of the bowel preparation was                            good. The terminal ileum, ileocecal valve,                            appendiceal orifice, and rectum were photographed. Scope In: 8:32:30 AM Scope Out: 8:49:06 AM Scope Withdrawal Time: 0 hours 14 minutes 50 seconds  Total Procedure Duration: 0 hours 16 minutes 36 seconds  Findings:                 The perianal and digital rectal examinations were                            normal.                           The terminal ileum appeared normal.                           A 4 mm polyp was found in the cecum. The polyp was                            sessile. The polyp was removed with a cold snare.                            Resection and retrieval were complete.                           A few small-mouthed diverticula were found in the  cecum.                           Internal hemorrhoids were found during retroflexion.                           The exam was otherwise without abnormality.                           Biopsies for histology were taken with a cold                            forceps from the right colon, left colon and                            transverse colon for evaluation of microscopic                            colitis. Complications:            No immediate complications. Estimated blood loss:                            Minimal. Estimated Blood Loss:     Estimated blood loss was minimal. Impression:               - The examined portion of the ileum was normal.                           - One 4 mm polyp in the cecum, removed with a cold                            snare. Resected and retrieved.                           - Diverticulosis in the cecum.                            - Internal hemorrhoids.                           - The examination was otherwise normal.                           - Biopsies were taken with a cold forceps from the                            right colon, left colon and transverse colon for                            evaluation of microscopic colitis.                           Overall, hemorrhoids are the likely cause of  bleeding symptoms in the setting of diarrhea. Recommendation:           - Patient has a contact number available for                            emergencies. The signs and symptoms of potential                            delayed complications were discussed with the                            patient. Return to normal activities tomorrow.                            Written discharge instructions were provided to the                            patient.                           - Resume previous diet.                           - Continue present medications.                           - Await pathology results.                           - Immodium as needed while pathology results pending                           - If pathology results negative, trial of colestid                            1gm twice daily given history of cholecystectomy                           - Repeat colonoscopy is recommended for                            surveillance. The colonoscopy date will be                            determined after pathology results from today's                            exam become available for review.                           - Consideration for hemorrhoid banding given                            ongoing symptoms of bleeding Remo Lipps P. Lucetta Baehr MD, MD 11/16/2017 8:53:48 AM This report has been signed electronically.

## 2017-11-16 NOTE — Patient Instructions (Signed)
   INFORMATION ON POLYPS,DIVERTICULOSIS,&HEMORRHOIDS GIVEN TO YOU TODAY  INFORMATION ON HEMORRHOID BANDING GIVEN TO YOU TODAY    YOU HAD AN ENDOSCOPIC PROCEDURE TODAY AT [facility]:   Refer to the procedure report that was given to you for any specific questions about what was found during the examination.  If the procedure report does not answer your questions, please call your gastroenterologist to clarify.  If you requested that your care partner not be given the details of your procedure findings, then the procedure report has been included in a sealed envelope for you to review at your convenience later.  YOU SHOULD EXPECT: Some feelings of bloating in the abdomen. Passage of more gas than usual.  Walking can help get rid of the air that was put into your GI tract during the procedure and reduce the bloating. If you had a lower endoscopy (such as a colonoscopy or flexible sigmoidoscopy) you may notice spotting of blood in your stool or on the toilet paper. If you underwent a bowel prep for your procedure, you may not have a normal bowel movement for a few days.  Please Note:  You might notice some irritation and congestion in your nose or some drainage.  This is from the oxygen used during your procedure.  There is no need for concern and it should clear up in a day or so.  SYMPTOMS TO REPORT IMMEDIATELY:   Following lower endoscopy (colonoscopy or flexible sigmoidoscopy):  Excessive amounts of blood in the stool  Significant tenderness or worsening of abdominal pains  Swelling of the abdomen that is new, acute  Fever of 100F or higher    For urgent or emergent issues, a gastroenterologist can be reached at any hour by calling [phone].   DIET:  We do recommend a small meal at first, but then you may proceed to your regular diet.  Drink plenty of fluids but you should avoid alcoholic beverages for 24 hours.  ACTIVITY:  You should plan to take it easy for the  rest of today and you should NOT DRIVE or use heavy machinery until tomorrow (because of the sedation medicines used during the test).    FOLLOW UP: Our staff will call the number listed on your records the next business day following your procedure to check on you and address any questions or concerns that you may have regarding the information given to you following your procedure. If we do not reach you, we will leave a message.  However, if you are feeling well and you are not experiencing any problems, there is no need to return our call.  We will assume that you have returned to your regular daily activities without incident.  If any biopsies were taken you will be contacted by phone or by letter within the next 1-3 weeks.  Please call us at [phone] if you have not heard about the biopsies in 3 weeks.    SIGNATURES/CONFIDENTIALITY: You and/or your care partner have signed paperwork which will be entered into your electronic medical record.  These signatures attest to the fact that that the information above on your After Visit Summary has been reviewed and is understood.  Full responsibility of the confidentiality of this discharge information lies with you and/or your care-partner.

## 2017-11-17 ENCOUNTER — Telehealth: Payer: Self-pay | Admitting: *Deleted

## 2017-11-17 NOTE — Telephone Encounter (Signed)
  Follow up Call-  Call back number 11/16/2017  Post procedure Call Back phone  # [phone]  Permission to leave phone message Yes  Some recent data might be hidden    [facility]

## 2017-11-17 NOTE — Telephone Encounter (Signed)
  Follow up Call-  Call back number 11/16/2017  Post procedure Call Back phone  # [phone]  Permission to leave phone message Yes  Some recent data might be hidden    [facility]

## 2017-11-26 ENCOUNTER — Other Ambulatory Visit: Payer: Self-pay

## 2017-11-26 MED ORDER — COLESTIPOL HCL 1 G PO TABS: 1.0000 g | ORAL_TABLET | Freq: Two times a day (BID) | ORAL | 1 refills | Status: DC

## 2017-12-02 ENCOUNTER — Ambulatory Visit (INDEPENDENT_AMBULATORY_CARE_PROVIDER_SITE_OTHER): Payer: 59 | Admitting: Gastroenterology

## 2017-12-02 ENCOUNTER — Encounter: Payer: Self-pay | Admitting: Gastroenterology

## 2017-12-02 VITALS — Ht 64.0 in | Wt 253.2 lb

## 2017-12-02 DIAGNOSIS — K648 Other hemorrhoids: Secondary | ICD-10-CM

## 2017-12-02 NOTE — Patient Instructions (Addendum)
.  HEMORRHOID BANDING PROCEDURE    FOLLOW-UP CARE   1. The procedure you have had should have been relatively painless since the banding of the area involved does not have nerve endings and there is no pain sensation.  The rubber band cuts off the blood supply to the hemorrhoid and the band may fall off as soon as 48 hours after the banding (the band may occasionally be seen in the toilet bowl following a bowel movement). You may notice a temporary feeling of fullness in the rectum which should respond adequately to plain Tylenol or Motrin.  2. Following the banding, avoid strenuous exercise that evening and resume full activity the next day.  A sitz bath (soaking in a warm tub) or bidet is soothing, and can be useful for cleansing the area after bowel movements.     3. To avoid constipation, take two tablespoons of natural wheat bran, natural oat bran, flax, Benefiber or any over the counter fiber supplement and increase your water intake to 7-8 glasses daily.    4. Unless you have been prescribed anorectal medication, do not put anything inside your rectum for two weeks: No suppositories, enemas, fingers, etc.  5. Occasionally, you may have more bleeding than usual after the banding procedure.  This is often from the untreated hemorrhoids rather than the treated one.  Don't be concerned if there is a tablespoon or so of blood.  If there is more blood than this, lie flat with your bottom higher than your head and apply an ice pack to the area. If the bleeding does not stop within a half an hour or if you feel faint, call our office at [phone] or go to the emergency room.  6. Problems are not common; however, if there is a substantial amount of bleeding, severe pain, chills, fever or difficulty passing urine (very rare) or other problems, you should call us at [phone] or report to the nearest emergency room.  7. Do not stay seated continuously for more than 2-3 hours for a day or  two after the procedure.  Tighten your buttock muscles 10-15 times every two hours and take 10-15 deep breaths every 1-2 hours.  Do not spend more than a few minutes on the toilet if you cannot empty your bowel; instead re-visit the toilet at a later time.    We have scheduled you for a 2nd banding appointment on 01-06-18 at 4:00pm.  If you need to reschedule please call us as soon as possible.  Thank you for entrusting me with your care, Dr. Archbald Cellar

## 2017-12-02 NOTE — Progress Notes (Signed)
Patient here for treatment of hemorrhoids. Irritation is main symptom in the setting of loose stools. Colestid and immodium PRN working well to control diarrhea. I have discussed options for hemorrhoid management with her and she wishes to proceed with banding.    PROCEDURE NOTE: The patient presents with symptomatic grade I  hemorrhoids, requesting rubber band ligation of his/her hemorrhoidal disease.  All risks, benefits and alternative forms of therapy were described and informed consent was obtained.  In the Left Lateral Decubitus position anoscopic examination was not performed as patient was sensitive to the anoscope and declined this portion of the exam  The anorectum was pre-medicated with 0.125% nitroglycerin ointment The decision was made to band the LL internal hemorrhoid, and the [place] was used to perform band ligation without complication.  Digital anorectal examination was then performed to assure proper positioning of the band, and to adjust the banded tissue as required.  The patient was discharged home without pain or other issues.  Dietary and behavioral recommendations were given and along with follow-up instructions.     The following adjunctive treatments were recommended: Continue colestid and immodium PRN for loose stools  The patient will return in 2-4 weeks for  follow-up and possible additional banding as required. No complications were encountered and the patient tolerated the procedure well.  [place] Cellar, MD [facility] Pager [phone]

## 2017-12-30 ENCOUNTER — Other Ambulatory Visit: Payer: Self-pay | Admitting: Family Medicine

## 2017-12-30 NOTE — Telephone Encounter (Signed)
Ok to refill??  Last office visit/ refill 09/06/2017, #3 refills.

## 2018-01-04 ENCOUNTER — Telehealth: Payer: Self-pay | Admitting: Gastroenterology

## 2018-01-04 NOTE — Telephone Encounter (Signed)
Routed to Dr. Armbruster, please advise. 

## 2018-01-04 NOTE — Telephone Encounter (Signed)
Called and tried to leave a message, was not able to, will call back

## 2018-01-05 ENCOUNTER — Ambulatory Visit (INDEPENDENT_AMBULATORY_CARE_PROVIDER_SITE_OTHER): Payer: 59 | Admitting: Family Medicine

## 2018-01-05 ENCOUNTER — Encounter: Payer: Self-pay | Admitting: Family Medicine

## 2018-01-05 ENCOUNTER — Other Ambulatory Visit: Payer: Self-pay

## 2018-01-05 VITALS — BP 120/68 | HR 76 | Temp 97.9°F | Resp 14 | Ht 64.0 in | Wt 251.0 lb

## 2018-01-05 DIAGNOSIS — G8929 Other chronic pain: Secondary | ICD-10-CM | POA: Diagnosis not present

## 2018-01-05 DIAGNOSIS — M5126 Other intervertebral disc displacement, lumbar region: Secondary | ICD-10-CM

## 2018-01-05 DIAGNOSIS — F5101 Primary insomnia: Secondary | ICD-10-CM | POA: Diagnosis not present

## 2018-01-05 DIAGNOSIS — M6281 Muscle weakness (generalized): Secondary | ICD-10-CM

## 2018-01-05 DIAGNOSIS — M545 Low back pain, unspecified: Secondary | ICD-10-CM

## 2018-01-05 DIAGNOSIS — F418 Other specified anxiety disorders: Secondary | ICD-10-CM | POA: Diagnosis not present

## 2018-01-05 DIAGNOSIS — M5136 Other intervertebral disc degeneration, lumbar region: Secondary | ICD-10-CM

## 2018-01-05 DIAGNOSIS — Z9889 Other specified postprocedural states: Secondary | ICD-10-CM | POA: Diagnosis not present

## 2018-01-05 DIAGNOSIS — E119 Type 2 diabetes mellitus without complications: Secondary | ICD-10-CM | POA: Diagnosis not present

## 2018-01-05 DIAGNOSIS — G5603 Carpal tunnel syndrome, bilateral upper limbs: Secondary | ICD-10-CM | POA: Diagnosis not present

## 2018-01-05 MED ORDER — ESCITALOPRAM OXALATE 20 MG PO TABS: 20.0000 mg | ORAL_TABLET | Freq: Every day | ORAL | 2 refills | Status: DC

## 2018-01-05 MED ORDER — TRAMADOL HCL 50 MG PO TABS: 50.0000 mg | ORAL_TABLET | Freq: Three times a day (TID) | ORAL | 0 refills | Status: DC | PRN

## 2018-01-05 NOTE — Assessment & Plan Note (Signed)
Recheck A1C on MTF Needs significant weight loss but multiple factors causing roadblocks currently

## 2018-01-05 NOTE — Assessment & Plan Note (Signed)
Concern for nerve impingment may even has some spinal stenosis, her habitus also does not help Refilled Ultram Obtain MRI lumbar spine

## 2018-01-05 NOTE — Telephone Encounter (Signed)
I called the patient. She had 1-2 days of discomfort that really bothered her after the banding, and then it resolved. She states the banding has decreased her bleeding symptoms significantly, and does think it helped. I discussed with her whether or not she wished to have additional banding. She is anxious about having procedures done in general, usually has anxiolytics prior to going to the dentist, etc. She is asking for something to take prior to next banding as she does wish to proceed. We can give her 5mg  of valium to take 1/2 hr prior to it, and then she will need a ride home. She was agreeable to this. Otherwise, will plan on giving her nitroglycerin ointment 0.125% after her next banding to use for spasm if she develops it.  Sabrina Roberts can you rebook her for a banding, first available, not sure if we have anything in 1-2 weeks. Please give her valium 5mg  tablet x 2. She can take 1 tablet 1/2 hr prior to the procedure, and another if needed afterwards. Thanks

## 2018-01-05 NOTE — Assessment & Plan Note (Signed)
Increase lexapro to 20mg  daily Referral to counseling pt to call as free with her Job

## 2018-01-05 NOTE — Assessment & Plan Note (Deleted)
S/p surgery but residual weakness, refer to hand surgeon to evaluate

## 2018-01-05 NOTE — Patient Instructions (Addendum)
Referral to hand surgeon MRI to be done of back  Increase lexapro to 20mg  Call the therapist  Ultram as needed for pain  Magneisum can be used for cramps F/U 3  months

## 2018-01-05 NOTE — Telephone Encounter (Signed)
Patient returned phone call. Best # [phone]. Patient will be unavailable to talk between 3-4pm

## 2018-01-05 NOTE — Progress Notes (Signed)
Subjective:    Patient ID: Sabrina Roberts, female    DOB: 04-01-1971, 47 y.o.   MRN: [id number]  Patient presents for Back Pain with Sciatic (lower back pain radiating down L hip and leg); Hand Issues (B weakness to hands and feels like she can't get a good grip- has been treated for carpal tunnel); and Leg Cramps (states that she wakes up at night with leg cramps)   Chronic back pain with sciatica, has known disc bulge, wose down left side. Leg gets numb, walking gets worse . Still cramps in legs in middle of night   continues to get severe leg cramps    DM- last checked a few weeks ago, 109-115, on metformin but stopped 1 week ago, because of a new medication given from GI for diarrhea   Has weakness in hands since carpal tunnel surgery in 2017, wants a second opinion about why grip is so weak, often drops things from hands. Does not have any pain  Depression- increased stress, taking lexapro 10mg  but not helping as much. Husband has significant mental illness recently placed in behavioral health due to not taking meds, somehting happened with step daughter and she is no longer talking to the family. She is working long shifts, taking care of everyone excepgt her self. Appetite is up and down, only sleeps if ambien taken, She is not losing weight.   Review Of Systems:  GEN- denies fatigue, fever, weight loss,weakness, recent illness HEENT- denies eye drainage, change in vision, nasal discharge, CVS- denies chest pain, palpitations RESP- denies SOB, cough, wheeze ABD- denies N/V, change in stools, abd pain GU- denies dysuria, hematuria, dribbling, incontinence MSK- + joint pain, muscle aches, injury Neuro- denies headache, dizziness, syncope, seizure activity       Objective:    BP 120/68   Pulse 76   Temp 97.9 F (36.6 C) (Oral)   Resp 14   Ht 5\' 4"  (1.626 m)   Wt 251 lb (113.9 kg)   SpO2 100%   BMI 43.08 kg/m  GEN- NAD, alert and oriented x3 HEENT- PERRL, EOMI, non  injected sclera, pink conjunctiva, MMM, oropharynx clear Neck- Supple, no thyromegaly CVS- RRR, no murmur RESP-CTAB ABD-NABS,soft,NT,ND MSK- TTP lumbar spine+ SLR left side, decreaesd ROM spine, strenth decreased left compared to right Neuro- weak grasp, R worse than Left, sensation grossly in tact, normal tone upper and lower ext Psych- normal affect and mood  EXT- No edema Pulses- Radial, DP- 2+        Assessment & Plan:      Problem List Items Addressed This Visit      Unprioritized   Morbid obesity ([place])   Insomnia   Bulging of lumbar intervertebral disc - Primary   Relevant Orders   MR Lumbar Spine Wo Contrast   DM (diabetes mellitus) ([place])    Recheck A1C on MTF Needs significant weight loss but multiple factors causing roadblocks currently      Relevant Orders   CBC with Differential/Platelet   Comprehensive metabolic panel   Hemoglobin A1c   Depression with anxiety    Increase lexapro to 20mg  daily Referral to counseling pt to call as free with her Job      Relevant Medications   escitalopram (LEXAPRO) 20 MG tablet   Chronic back pain    Concern for nerve impingment may even has some spinal stenosis, her habitus also does not help Refilled Ultram Obtain MRI lumbar spine      Relevant Medications  traMADol (ULTRAM) 50 MG tablet   Other Relevant Orders   MR Lumbar Spine Wo Contrast   RESOLVED: Carpal tunnel syndrome   Relevant Medications   escitalopram (LEXAPRO) 20 MG tablet    Other Visit Diagnoses    Hand muscle weakness       Relevant Orders   Ambulatory referral to Hand Surgery   History of carpal tunnel release       S/p surgery but residual weakness, refer to hand surgeon to evaluate   Relevant Orders   Ambulatory referral to Hand Surgery      Note: This dictation was prepared with Dragon dictation along with smaller phrase technology. Any transcriptional errors that result from this process are unintentional.

## 2018-01-06 ENCOUNTER — Other Ambulatory Visit: Payer: Self-pay

## 2018-01-06 ENCOUNTER — Encounter: Payer: 59 | Admitting: Gastroenterology

## 2018-01-06 LAB — COMPREHENSIVE METABOLIC PANEL
AG Ratio: 1.4 (calc) (ref 1.0–2.5)
ALT: 12 U/L (ref 6–29)
AST: 17 U/L (ref 10–35)
Albumin: 4.1 g/dL (ref 3.6–5.1)
Alkaline phosphatase (APISO): 106 U/L (ref 33–115)
BUN: 8 mg/dL (ref 7–25)
CO2: 21 mmol/L (ref 20–32)
Calcium: 9 mg/dL (ref 8.6–10.2)
Chloride: 107 mmol/L (ref 98–110)
Creat: 0.65 mg/dL (ref 0.50–1.10)
Globulin: 2.9 g/dL (calc) (ref 1.9–3.7)
Glucose, Bld: 94 mg/dL (ref 65–99)
Potassium: 4.2 mmol/L (ref 3.5–5.3)
Sodium: 139 mmol/L (ref 135–146)
Total Bilirubin: 0.4 mg/dL (ref 0.2–1.2)
Total Protein: 7 g/dL (ref 6.1–8.1)

## 2018-01-06 LAB — CBC WITH DIFFERENTIAL/PLATELET
Basophils Absolute: 30 cells/uL (ref 0–200)
Basophils Relative: 0.4 %
Eosinophils Absolute: 180 cells/uL (ref 15–500)
Eosinophils Relative: 2.4 %
HCT: 34.8 % — ABNORMAL LOW (ref 35.0–45.0)
Hemoglobin: 11.2 g/dL — ABNORMAL LOW (ref 11.7–15.5)
Lymphs Abs: 3008 cells/uL (ref 850–3900)
MCH: 27.3 pg (ref 27.0–33.0)
MCHC: 32.2 g/dL (ref 32.0–36.0)
MCV: 84.9 fL (ref 80.0–100.0)
MPV: 11.7 fL (ref 7.5–12.5)
Monocytes Relative: 7.8 %
Neutro Abs: 3698 cells/uL (ref 1500–7800)
Neutrophils Relative %: 49.3 %
Platelets: 347 10*3/uL (ref 140–400)
RBC: 4.1 10*6/uL (ref 3.80–5.10)
RDW: 13.8 % (ref 11.0–15.0)
Total Lymphocyte: 40.1 %
WBC mixed population: 585 cells/uL (ref 200–950)
WBC: 7.5 10*3/uL (ref 3.8–10.8)

## 2018-01-06 LAB — HEMOGLOBIN A1C
Hgb A1c MFr Bld: 6.7 % of total Hgb — ABNORMAL HIGH (ref <5.7)
Mean Plasma Glucose: 146 (calc)
eAG (mmol/L): 8.1 (calc)

## 2018-01-06 MED ORDER — DIAZEPAM 5 MG PO TABS: ORAL_TABLET | ORAL | 0 refills | Status: DC

## 2018-01-06 NOTE — Telephone Encounter (Signed)
Patient is scheduled at first available in middle of February. Will send her the written prescription for the valium, also wondering if I can call in the nitroglycerin 0.125% ointment prior to her banding so she can pick this up to have available when she goes home from procedure and does not have to stop at pharmacy. Thanks.

## 2018-01-06 NOTE — Telephone Encounter (Signed)
Yes why don't we prescribe the nitroglycerin for her prior to the banding so she will have it. Thanks

## 2018-01-07 NOTE — Telephone Encounter (Signed)
Spoke to patient let her know that I have called in the prescription for nitroglycerin ointment to [facility], she asked that I mail her the other Rx.

## 2018-01-26 ENCOUNTER — Ambulatory Visit (HOSPITAL_COMMUNITY)
Admission: RE | Admit: 2018-01-26 | Discharge: 2018-01-26 | Disposition: A | Discharge status: Home / Self Care | Payer: 59 | Source: Ambulatory Visit | Attending: Family Medicine | Admitting: Family Medicine

## 2018-01-26 DIAGNOSIS — M4804 Spinal stenosis, thoracic region: Secondary | ICD-10-CM | POA: Diagnosis not present

## 2018-01-26 DIAGNOSIS — M5124 Other intervertebral disc displacement, thoracic region: Secondary | ICD-10-CM | POA: Diagnosis not present

## 2018-01-26 DIAGNOSIS — M5136 Other intervertebral disc degeneration, lumbar region: Secondary | ICD-10-CM

## 2018-01-26 DIAGNOSIS — M5126 Other intervertebral disc displacement, lumbar region: Secondary | ICD-10-CM

## 2018-01-26 DIAGNOSIS — M1288 Other specific arthropathies, not elsewhere classified, other specified site: Secondary | ICD-10-CM | POA: Diagnosis not present

## 2018-01-26 DIAGNOSIS — M545 Low back pain, unspecified: Secondary | ICD-10-CM

## 2018-01-26 DIAGNOSIS — M48061 Spinal stenosis, lumbar region without neurogenic claudication: Secondary | ICD-10-CM | POA: Diagnosis not present

## 2018-01-26 DIAGNOSIS — G8929 Other chronic pain: Secondary | ICD-10-CM

## 2018-01-28 ENCOUNTER — Other Ambulatory Visit: Payer: Self-pay | Admitting: *Deleted

## 2018-01-28 DIAGNOSIS — M5134 Other intervertebral disc degeneration, thoracic region: Secondary | ICD-10-CM

## 2018-01-28 DIAGNOSIS — M5410 Radiculopathy, site unspecified: Secondary | ICD-10-CM

## 2018-01-28 DIAGNOSIS — M5124 Other intervertebral disc displacement, thoracic region: Secondary | ICD-10-CM

## 2018-01-28 DIAGNOSIS — M4800 Spinal stenosis, site unspecified: Secondary | ICD-10-CM

## 2018-01-28 MED ORDER — GABAPENTIN 300 MG PO CAPS: 300.0000 mg | ORAL_CAPSULE | Freq: Every day | ORAL | 3 refills | Status: DC

## 2018-02-03 ENCOUNTER — Telehealth: Payer: 59 | Admitting: Physician Assistant

## 2018-02-03 DIAGNOSIS — R6889 Other general symptoms and signs: Secondary | ICD-10-CM

## 2018-02-03 DIAGNOSIS — R0789 Other chest pain: Secondary | ICD-10-CM

## 2018-02-03 NOTE — Progress Notes (Signed)
Based on what you shared with me it looks like you have a serious condition that should be evaluated in a face to face office visit. You are endorsing flu luke symptoms but ate also noting constant pain in cheat or abdomen and history of chromic disease. As such you need an in person assessment and examination NOTE: If you entered your credit card information for this eVisit, you will not be charged. You may see a "hold" on your card for the $30 but that hold will drop off and you will not have a charge processed.  If you are having a true medical emergency please call 911.  If you need an urgent face to face visit, [place] has four urgent care centers for your convenience.  If you need care fast and have a high deductible or no insurance consider:   [email or web address] to reserve your spot online an avoid wait times  [facility] [address], [address] [place], [place] [address] 8 am to 8 pm Monday-Friday 10 am to 4 pm Saturday-Sunday *Across the street from [employer]  [place], [address] 8 am to 5 pm Monday-Friday * In the [facility] on the [facility]   The following sites will take your  insurance:  . [facility] [place] a Provider at this Location  [address] [place], [place] [address] . 10 am to 8 pm Monday-Friday . 12 pm to 8 pm Saturday-Sunday   . [facility] at [place] a Provider at this Location  [place], [place], [place] [address] . 8 am to 8 pm Monday-Friday . 9 am to 6 pm Saturday . 11 am to 6 pm Sunday   . [facility] at [place] Get Driving Directions  [address] [place], [place] [address] . 8 am to 8 pm Monday-Friday . 8 am to 4 pm Saturday-Sunday   Your e-visit answers were  reviewed by a board certified advanced clinical practitioner to complete your personal care plan.  Thank you for using e-Visits.

## 2018-02-08 DIAGNOSIS — M4804 Spinal stenosis, thoracic region: Secondary | ICD-10-CM | POA: Diagnosis not present

## 2018-02-08 DIAGNOSIS — Z6841 Body Mass Index (BMI) 40.0 and over, adult: Secondary | ICD-10-CM | POA: Diagnosis not present

## 2018-02-08 DIAGNOSIS — R030 Elevated blood-pressure reading, without diagnosis of hypertension: Secondary | ICD-10-CM | POA: Diagnosis not present

## 2018-02-08 DIAGNOSIS — I10 Essential (primary) hypertension: Secondary | ICD-10-CM | POA: Diagnosis not present

## 2018-02-14 ENCOUNTER — Encounter: Payer: Self-pay | Admitting: Family Medicine

## 2018-02-14 ENCOUNTER — Other Ambulatory Visit: Payer: Self-pay

## 2018-02-14 ENCOUNTER — Ambulatory Visit (INDEPENDENT_AMBULATORY_CARE_PROVIDER_SITE_OTHER): Payer: 59 | Admitting: Family Medicine

## 2018-02-14 VITALS — BP 128/86 | HR 89 | Temp 98.5°F | Resp 16 | Wt 251.2 lb

## 2018-02-14 DIAGNOSIS — R52 Pain, unspecified: Secondary | ICD-10-CM | POA: Diagnosis not present

## 2018-02-14 DIAGNOSIS — J029 Acute pharyngitis, unspecified: Secondary | ICD-10-CM | POA: Diagnosis not present

## 2018-02-14 DIAGNOSIS — J111 Influenza due to unidentified influenza virus with other respiratory manifestations: Secondary | ICD-10-CM

## 2018-02-14 LAB — INFLUENZA A AND B AG, IMMUNOASSAY
INFLUENZA A ANTIGEN: NOT DETECTED
INFLUENZA B ANTIGEN: NOT DETECTED

## 2018-02-14 MED ORDER — HYDROCOD POLST-CPM POLST ER 10-8 MG/5ML PO SUER: 5.0000 mL | Freq: Two times a day (BID) | ORAL | 0 refills | Status: DC | PRN

## 2018-02-14 MED ORDER — OSELTAMIVIR PHOSPHATE 75 MG PO CAPS: 75.0000 mg | ORAL_CAPSULE | Freq: Two times a day (BID) | ORAL | 0 refills | Status: DC

## 2018-02-14 NOTE — Progress Notes (Signed)
   Subjective:    Patient ID: Sabrina Roberts, female    DOB: 08/31/1971, 47 y.o.   MRN: [id number]  Patient presents for Chills (x3days); Sore Throat (x3days); and Generalized Body Aches (x3days)   Pt started mild achiness on Thursday but drainage  started on Saturday with sore throat, body aches. Had subjective fever Saturday, took tylenol cold and flu. Coughing with mild production started yesterday. Soreness in chest from the cough. This morning feels like she has been "hit by a bus" No vomiting, mild nausea. Had diarrhea yesterday- but has intermittant diarrhea.  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5 F (36.9 C) (Oral)   Resp 16   Wt 251 lb 3.2 oz (113.9 kg)   LMP  (LMP Unknown)   SpO2 98%   BMI 43.12 kg/m  GEN- NAD, alert and oriented x3,ill appearing  HEENT- PERRL, EOMI, non injected sclera, pink conjunctiva, MMM, oropharynx clear, TM clear bilat no effusion,  + mild maxillary sinus tenderness,+  Nasal drainage  Neck- Supple, shotty submandibular  LAD CVS- RRR, no murmur RESP-CTAB abd-nabs,soft,NT,ND  EXT- No edema Pulses- Radial 2+          Assessment & Plan:      Problem List Items Addressed This Visit    None    Visit Diagnoses    Influenza    -  Primary   Flu neg, but classic symptoms and multuple flu exposures, will treat with  tamiflu, push fluids, tussionex cough, tylenol for fever,work note   Relevant Medications   oseltamivir (TAMIFLU) 75 MG capsule   Other Relevant Orders   Influenza A and B Ag, Immunoassay (Completed)   Sore throat       Relevant Orders   STREP GROUP A AG, W/REFLEX TO CULT (Completed)      Note: This dictation was prepared with Dragon dictation along with smaller phrase technology. Any transcriptional errors that result from this process are unintentional.

## 2018-02-14 NOTE — Patient Instructions (Addendum)
Tussionex  Tamiflu  Salt water gargle

## 2018-02-16 ENCOUNTER — Encounter: Payer: 59 | Admitting: Gastroenterology

## 2018-02-16 LAB — CULTURE, GROUP A STREP
MICRO NUMBER:: 90212143
SPECIMEN QUALITY:: ADEQUATE

## 2018-02-16 LAB — STREP GROUP A AG, W/REFLEX TO CULT: Streptococcus, Group A Screen (Direct): NOT DETECTED

## 2018-03-25 ENCOUNTER — Encounter: Payer: Self-pay | Admitting: Family Medicine

## 2018-03-25 ENCOUNTER — Other Ambulatory Visit: Payer: Self-pay

## 2018-03-25 ENCOUNTER — Ambulatory Visit (INDEPENDENT_AMBULATORY_CARE_PROVIDER_SITE_OTHER): Payer: 59 | Admitting: Family Medicine

## 2018-03-25 ENCOUNTER — Encounter: Payer: 59 | Admitting: Gastroenterology

## 2018-03-25 VITALS — BP 124/84 | HR 77 | Temp 97.7°F | Resp 16 | Ht 64.0 in | Wt 255.2 lb

## 2018-03-25 DIAGNOSIS — M545 Low back pain, unspecified: Secondary | ICD-10-CM

## 2018-03-25 MED ORDER — METHOCARBAMOL 750 MG PO TABS: 750.0000 mg | ORAL_TABLET | Freq: Four times a day (QID) | ORAL | 0 refills | Status: DC

## 2018-03-25 MED ORDER — TRAMADOL HCL 50 MG PO TABS: 50.0000 mg | ORAL_TABLET | Freq: Three times a day (TID) | ORAL | 0 refills | Status: DC | PRN

## 2018-03-25 NOTE — Patient Instructions (Addendum)
Low Back Strain A strain is a stretch or tear in a muscle or the strong cords of tissue that attach muscle to bone (tendons). Strains of the lower back (lumbar spine) are a common cause of low back pain. A strain occurs when muscles or tendons are torn or are stretched beyond their limits. The muscles may become inflamed, resulting in pain and sudden muscle tightening (spasms). A strain can happen suddenly due to an injury (trauma), or it can develop gradually due to overuse. There are three types of strains:  Grade 1 is a mild strain involving a minor tear of the muscle fibers or tendons. This may cause some pain but no loss of muscle strength.  Grade 2 is a moderate strain involving a partial tear of the muscle fibers or tendons. This causes more severe pain and some loss of muscle strength.  Grade 3 is a severe strain involving a complete tear of the muscle or tendon. This causes severe pain and complete or nearly complete loss of muscle strength.  What are the causes? This condition may be caused by:  Trauma, such as a fall or a hit to the body.  Twisting or overstretching the back. This may result from doing activities that require a lot of energy, such as lifting heavy objects.  What increases the risk? The following factors may increase your risk of getting this condition:  Playing contact sports.  Participating in sports or activities that put excessive stress on the back and require a lot of bending and twisting, including: ? Lifting weights or heavy objects. ? Gymnastics. ? Soccer. ? Figure skating. ? Snowboarding.  Being overweight or obese.  Having poor strength and flexibility.  What are the signs or symptoms? Symptoms of this condition may include:  Sharp or dull pain in the lower back that does not go away. Pain may extend to the buttocks.  Stiffness.  Limited range of motion.  Inability to stand up straight due to stiffness or pain.  Muscle spasms.  How  is this diagnosed? This condition may be diagnosed based on:  Your symptoms.  Your medical history.  A physical exam. ? Your health care provider may push on certain areas of your back to determine the source of your pain. ? You may be asked to bend forward, backward, and side to side to assess the severity of your pain and your range of motion.  Imaging tests, such as: ? X-rays. ? MRI.  How is this treated? Treatment for this condition may include:  Applying heat and cold to the affected area.  Medicines to help relieve pain and to relax your muscles (muscle relaxants).  NSAIDs to help reduce swelling and discomfort.  Physical therapy.  When your symptoms improve, it is important to gradually return to your normal routine as soon as possible to reduce pain, avoid stiffness, and avoid loss of muscle strength. Generally, symptoms should improve within 6 weeks of treatment. However, recovery time varies. Follow these instructions at home: Managing pain, stiffness, and swelling  If directed, apply ice to the injured area during the first 24 hours after your injury. ? Put ice in a plastic bag. ? Place a towel between your skin and the bag. ? Leave the ice on for 20 minutes, 2-3 times a day.  If directed, apply heat to the affected area as often as told by your health care provider. Use the heat source that your health care provider recommends, such as a moist heat pack   or a heating pad. ? Place a towel between your skin and the heat source. ? Leave the heat on for 20-30 minutes. ? Remove the heat if your skin turns bright red. This is especially important if you are unable to feel pain, heat, or cold. You may have a greater risk of getting burned. Activity  Rest and return to your normal activities as told by your health care provider. Ask your health care provider what activities are safe for you.  Avoid activities that take a lot of effort (are strenuous) for as long as told  by your health care provider.  Do exercises as told by your health care provider. General instructions   Take over-the-counter and prescription medicines only as told by your health care provider.  If you have questions or concerns about safety while taking pain medicine, talk with your health care provider.  Do not drive or operate heavy machinery until you know how your pain medicine affects you.  Do not use any tobacco products, such as cigarettes, chewing tobacco, and e-cigarettes. Tobacco can delay bone healing. If you need help quitting, ask your health care provider.  Keep all follow-up visits as told by your health care provider. This is important. How is this prevented?  Warm up and stretch before being active.  Cool down and stretch after being active.  Give your body time to rest between periods of activity.  Avoid: ? Being physically inactive for long periods at a time. ? Exercising or playing sports when you are tired or in pain.  Use correct form when playing sports and lifting heavy objects.  Use good posture when sitting and standing.  Maintain a healthy weight.  Sleep on a mattress with medium firmness to support your back.  Make sure to use equipment that fits you, including shoes that fit well.  Be safe and responsible while being active to avoid falls.  Do at least 150 minutes of moderate-intensity exercise each week, such as brisk walking or water aerobics. Try a form of exercise that takes stress off your back, such as swimming or stationary cycling.  Maintain physical fitness, including: ? Strength. ? Flexibility. ? Cardiovascular fitness. ? Endurance. Contact a health care provider if:  Your back pain does not improve after 6 weeks of treatment.  Your symptoms get worse. Get help right away if:  Your back pain is severe.  You are unable to stand or walk.  You develop pain in your legs.  You develop weakness in your buttocks or  legs.  You have difficulty controlling when you urinate or when you have a bowel movement. This information is not intended to replace advice given to you by your health care provider. Make sure you discuss any questions you have with your health care provider. Document Released: 12/14/2005 Document Revised: 08/20/2016 Document Reviewed: 09/25/2015 [employer] Interactive Patient Education  2017 [employer]   Low Back Sprain Rehab Ask your health care provider which exercises are safe for you. Do exercises exactly as told by your health care provider and adjust them as directed. It is normal to feel mild stretching, pulling, tightness, or discomfort as you do these exercises, but you should stop right away if you feel sudden pain or your pain gets worse. Do not begin these exercises until told by your health care provider. Stretching and range of motion exercises These exercises warm up your muscles and joints and improve the movement and flexibility of your back. These exercises also help   to relieve pain, numbness, and tingling. Exercise A: Lumbar rotation  1. Lie on your back on a firm surface and bend your knees. 2. Straighten your arms out to your sides so each arm forms an "L" shape with a side of your body (a 90 degree angle). 3. Slowly move both of your knees to one side of your body until you feel a stretch in your lower back. Try not to let your shoulders move off of the floor. 4. Hold for __________ seconds. 5. Tense your abdominal muscles and slowly move your knees back to the starting position. 6. Repeat this exercise on the other side of your body. Repeat __________ times. Complete this exercise __________ times a day. Exercise B: Prone extension on elbows  1. Lie on your abdomen on a firm surface. 2. Prop yourself up on your elbows. 3. Use your arms to help lift your chest up until you feel a gentle stretch in your abdomen and your lower back. ? This will place some of your  body weight on your elbows. If this is uncomfortable, try stacking pillows under your chest. ? Your hips should stay down, against the surface that you are lying on. Keep your hip and back muscles relaxed. 4. Hold for __________ seconds. 5. Slowly relax your upper body and return to the starting position. Repeat __________ times. Complete this exercise __________ times a day. Strengthening exercises These exercises build strength and endurance in your back. Endurance is the ability to use your muscles for a long time, even after they get tired. Exercise C: Pelvic tilt 1. Lie on your back on a firm surface. Bend your knees and keep your feet flat. 2. Tense your abdominal muscles. Tip your pelvis up toward the ceiling and flatten your lower back into the floor. ? To help with this exercise, you may place a small towel under your lower back and try to push your back into the towel. 3. Hold for __________ seconds. 4. Let your muscles relax completely before you repeat this exercise. Repeat __________ times. Complete this exercise __________ times a day. Exercise D: Alternating arm and leg raises  1. Get on your hands and knees on a firm surface. If you are on a hard floor, you may want to use padding to cushion your knees, such as an exercise mat. 2. Line up your arms and legs. Your hands should be below your shoulders, and your knees should be below your hips. 3. Lift your left leg behind you. At the same time, raise your right arm and straighten it in front of you. ? Do not lift your leg higher than your hip. ? Do not lift your arm higher than your shoulder. ? Keep your abdominal and back muscles tight. ? Keep your hips facing the ground. ? Do not arch your back. ? Keep your balance carefully, and do not hold your breath. 4. Hold for __________ seconds. 5. Slowly return to the starting position and repeat with your right leg and your left arm. Repeat __________ times. Complete this exercise  __________ times a day. Exercise E: Abdominal set with straight leg raise  1. Lie on your back on a firm surface. 2. Bend one of your knees and keep your other leg straight. 3. Tense your abdominal muscles and lift your straight leg up, 4-6 inches (10-15 cm) off the ground. 4. Keep your abdominal muscles tight and hold for __________ seconds. ? Do not hold your breath. ? Do not arch your   back. Keep it flat against the ground. 5. Keep your abdominal muscles tense as you slowly lower your leg back to the starting position. 6. Repeat with your other leg. Repeat __________ times. Complete this exercise __________ times a day. Posture and body mechanics  Body mechanics refers to the movements and positions of your body while you do your daily activities. Posture is part of body mechanics. Good posture and healthy body mechanics can help to relieve stress in your body's tissues and joints. Good posture means that your spine is in its natural S-curve position (your spine is neutral), your shoulders are pulled back slightly, and your head is not tipped forward. The following are general guidelines for applying improved posture and body mechanics to your everyday activities. Standing   When standing, keep your spine neutral and your feet about hip-width apart. Keep a slight bend in your knees. Your ears, shoulders, and hips should line up.  When you do a task in which you stand in one place for a long time, place one foot up on a stable object that is 2-4 inches (5-10 cm) high, such as a footstool. This helps keep your spine neutral. Sitting   When sitting, keep your spine neutral and keep your feet flat on the floor. Use a footrest, if necessary, and keep your thighs parallel to the floor. Avoid rounding your shoulders, and avoid tilting your head forward.  When working at a desk or a computer, keep your desk at a height where your hands are slightly lower than your elbows. Slide your chair under  your desk so you are close enough to maintain good posture.  When working at a computer, place your monitor at a height where you are looking straight ahead and you do not have to tilt your head forward or downward to look at the screen. Resting   When lying down and resting, avoid positions that are most painful for you.  If you have pain with activities such as sitting, bending, stooping, or squatting (flexion-based activities), lie in a position in which your body does not bend very much. For example, avoid curling up on your side with your arms and knees near your chest (fetal position).  If you have pain with activities such as standing for a long time or reaching with your arms (extension-based activities), lie with your spine in a neutral position and bend your knees slightly. Try the following positions:  Lying on your side with a pillow between your knees.  Lying on your back with a pillow under your knees. Lifting   When lifting objects, keep your feet at least shoulder-width apart and tighten your abdominal muscles.  Bend your knees and hips and keep your spine neutral. It is important to lift using the strength of your legs, not your back. Do not lock your knees straight out.  Always ask for help to lift heavy or awkward objects. This information is not intended to replace advice given to you by your health care provider. Make sure you discuss any questions you have with your health care provider. Document Released: 12/14/2005 Document Revised: 08/20/2016 Document Reviewed: 09/25/2015 [employer] Interactive Patient Education  2018 [employer]  

## 2018-03-25 NOTE — Progress Notes (Signed)
Patient ID: Sabrina Roberts, female    DOB: 12/06/1971, 47 y.o.   MRN: [id number]  PCP: Alycia Rossetti, MD  Chief Complaint  Patient presents with  . Back Pain    starting working out on tredmill and exercise bike     Subjective:   Sabrina Roberts is a 47 y.o. female, with PMH of bulging lumbar intervertebral disc, diabetes presents to office today with 1 week of constant, moderate left lower back pain that began after getting up off her exercise bike she felt a pull in her low back.  She describes the pain as "hitting a nerve" and it is exacerbated with sitting back in her chair with walking up and down a hill which she has to do frequently at work.  When she does try to sit straight up or lean back in her chair the increased pain causes her to be very nauseous.  She is a diagnosis of bulging disc in her lumbar spine, she reports a MRI as recent as 1-2 months ago which showed no spinal stenosis, she has had intermittent low back pain for roughly 15 years which she usually can rest and will go away however this is a little bit different.  She is getting no relief with Tylenol and ibuprofen.  She usually lays down on the floor and elevates her legs and that improves her back pain but that is also not helping.  She is having difficulty sleeping at night.  She denies any radiation of pain to her buttocks or down her legs, she denies any numbness, tingling, weakness, incontinence of urine or stool, saddle anesthesia.  She denies any urinary symptoms, denies abdominal pain.  About 2 months ago she was given tramadol which she would like to try for the increased pain.   Patient Active Problem List   Diagnosis Date Noted  . Rectal bleeding 11/04/2017  . Diarrhea 11/04/2017  . Generalized abdominal pain 11/04/2017  . Thoracic disc herniation 09/06/2017  . Chronic back pain 09/06/2017  . Anemia 09/06/2017  . B12 deficiency 06/08/2017  . Vitamin D deficiency 06/08/2017  . Depression with anxiety  06/08/2017  . Bulging of lumbar intervertebral disc 06/08/2017  . Carpal tunnel syndrome of right wrist   . Borderline abnormal TFTs 10/29/2014  . Irregular menses 08/01/2013  . Pain in joint, ankle and foot 08/01/2013  . Stress reaction 10/14/2012  . Insomnia 10/14/2012  . Gynecological examination 05/03/2012  . Shoulder pain 05/03/2012  . DM (diabetes mellitus) ([place]) 02/28/2012  . Hyperlipidemia 02/28/2012  . Morbid obesity ([place]) 02/28/2012  . Thyroid nodule 02/28/2012     Prior to Admission medications   Medication Sig Start Date End Date Taking? Authorizing Provider  Blood Glucose Monitoring Suppl (BLOOD GLUCOSE SYSTEM PAK) KIT Please dispense based on patient and insurance preference. Use as directed to monitor FSBS 1x daily. Dx: E11.9. 04/22/16  Yes [place], Modena Nunnery, MD  Cholecalciferol (VITAMIN D) 2000 units CAPS Take 1 capsule (2,000 Units total) by mouth daily. 10/05/17  Yes [place], Modena Nunnery, MD  colestipol (COLESTID) 1 g tablet Take 1 tablet (1 g total) by mouth 2 (two) times daily. 11/26/17  Yes [place], Carlota Raspberry, MD  diazepam (VALIUM) 5 MG tablet Take one tablet by mouth 30 minutes prior to procedure, take other prn anxiety. Please have someone available to drive you while taking this medication. 01/06/18  Yes [place], Carlota Raspberry, MD  dicyclomine (BENTYL) 20 MG tablet Take 1 tab twice daily. 11/04/17  Yes [place], Laban Emperor, PA-C  escitalopram (LEXAPRO) 20 MG tablet Take 1 tablet (20 mg total) by mouth at bedtime. 01/05/18  Yes [place], Modena Nunnery, MD  gabapentin (NEURONTIN) 300 MG capsule Take 1 capsule (300 mg total) by mouth at bedtime. 01/28/18  Yes [place], Modena Nunnery, MD  Glucose Blood (BLOOD GLUCOSE TEST STRIPS) STRP Please dispense based on patient and insurance preference. Use as directed to monitor FSBS 1x daily. Dx: E11.9. 04/22/16  Yes Alycia Rossetti, MD  Lancet Devices MISC Please dispense based on patient and insurance preference. Use as directed to monitor FSBS 1x  daily. Dx: E11.9. 04/22/16  Yes [place], Modena Nunnery, MD  Lancets MISC Please dispense based on patient and insurance preference. Use as directed to monitor FSBS 1x daily. Dx: E11.9. 04/22/16  Yes [place], Modena Nunnery, MD  metFORMIN (GLUCOPHAGE) 500 MG tablet TAKE ONE TABLET BY MOUTH ONCE DAILY WITH BREAKFAST 01/01/17  Yes [place], Modena Nunnery, MD  Multiple Vitamin (MULTIVITAMIN) tablet Take 1 tablet by mouth daily.    Yes [provider]  naproxen sodium (ANAPROX) 220 MG tablet Take 440 mg by mouth daily as needed (pain).   Yes [provider]  NEEDLE, DISP, 25 G 25G X 1" MISC Use to inject Vit B12 IM Q month 04/22/16  Yes [place], Modena Nunnery, MD  promethazine (PHENERGAN) 12.5 MG tablet Take 1 tablet (12.5 mg total) by mouth every 4 (four) hours as needed for nausea or vomiting. 10/07/16  Yes Carole Civil, MD  Syringe, Disposable, 3 ML MISC Use to inject Vit B12 IM Q month 04/22/16  Yes [place], Modena Nunnery, MD  zolpidem (AMBIEN) 10 MG tablet TAKE 1 TABLET BY MOUTH AT BEDTIME AS NEEDED 12/31/17  Yes [place], Modena Nunnery, MD     Allergies  Allergen Reactions  . Actos [Pioglitazone Hydrochloride] Anaphylaxis, Hives and Other (See Comments)    Chest pain   . Latex Rash     Family History  Problem Relation Age of Onset  . Hypertension Mother   . Depression Mother   . Diabetes Mother   . Irritable bowel syndrome Mother   . Heart disease Mother   . Stroke Mother   . Hypertension Father   . Hyperlipidemia Father   . Prostate cancer Father   . Colon polyps Father   . Heart disease Father   . Kidney Stones Father   . Diverticulitis Father   . Colon cancer Father   . Breast cancer Paternal Grandmother   . Colon cancer Paternal Grandfather   . Diabetes Maternal Grandmother   . Breast cancer Maternal Aunt   . Stomach cancer Paternal Uncle   . Colon cancer Other        Paternal Great Uncles x 4  . Colon polyps Paternal Aunt        x 3     Social History   Socioeconomic History    . Marital status: Married    Spouse name: Not on file  . Number of children: Not on file  . Years of education: Not on file  . Highest education level: Not on file  Occupational History  . Not on file  Social Needs  . Financial resource strain: Not on file  . Food insecurity:    Worry: Not on file    Inability: Not on file  . Transportation needs:    Medical: Not on file    Non-medical: Not on file  Tobacco Use  . Smoking status: Never Smoker  .  Smokeless tobacco: Never Used  Substance and Sexual Activity  . Alcohol use: Yes    Alcohol/week: 0.0 oz    Comment: 2 drinks per month  . Drug use: No  . Sexual activity: Yes    Partners: Male    Birth control/protection: None  Lifestyle  . Physical activity:    Days per week: Not on file    Minutes per session: Not on file  . Stress: Not on file  Relationships  . Social connections:    Talks on phone: Not on file    Gets together: Not on file    Attends religious service: Not on file    Active member of club or organization: Not on file    Attends meetings of clubs or organizations: Not on file    Relationship status: Not on file  . Intimate partner violence:    Fear of current or ex partner: Not on file    Emotionally abused: Not on file    Physically abused: Not on file    Forced sexual activity: Not on file  Other Topics Concern  . Not on file  Social History Narrative  . Not on file     Review of Systems  Constitutional: Negative.   HENT: Negative.   Eyes: Negative.   Respiratory: Negative.   Cardiovascular: Negative.   Gastrointestinal: Negative.  Negative for abdominal pain, diarrhea, nausea and vomiting.  Endocrine: Negative.   Genitourinary: Negative.   Musculoskeletal: Positive for back pain and myalgias. Negative for gait problem.  Skin: Negative.   Neurological: Negative for dizziness, light-headedness and numbness.  Psychiatric/Behavioral: Negative.   All other systems reviewed and are  negative.      Objective:    Vitals:   03/25/18 0810  BP: 124/84  Pulse: 77  Resp: 16  Temp: 97.7 F (36.5 C)  TempSrc: Oral  SpO2: 99%  Weight: 255 lb 3.2 oz (115.8 kg)  Height: '5\' 4"'  (1.626 m)     Physical Exam  Constitutional: She is oriented to person, place, and time. She appears well-developed and well-nourished.  Non-toxic appearance. No distress.  HENT:  Head: Normocephalic and atraumatic.  Right Ear: External ear normal.  Left Ear: External ear normal.  Nose: Nose normal.  Mouth/Throat: Uvula is midline, oropharynx is clear and moist and mucous membranes are normal.  Eyes: Pupils are equal, round, and reactive to light. Conjunctivae, EOM and lids are normal.  Neck: Normal range of motion and phonation normal. Neck supple. No tracheal deviation present.  Cardiovascular: Normal rate, regular rhythm, normal heart sounds and normal pulses. Exam reveals no gallop and no friction rub.  No murmur heard. Pulses:      Radial pulses are 2+ on the right side, and 2+ on the left side.  Pulmonary/Chest: Effort normal and breath sounds normal. No stridor. No respiratory distress. She has no wheezes. She has no rhonchi. She has no rales. She exhibits no tenderness.  Abdominal: Soft. Normal appearance and bowel sounds are normal. She exhibits no distension. There is no tenderness. There is no rigidity, no rebound, no guarding and no CVA tenderness.  Musculoskeletal: She exhibits tenderness. She exhibits no edema or deformity.       Cervical back: Normal.       Thoracic back: Normal.       Lumbar back: She exhibits decreased range of motion and tenderness. She exhibits no bony tenderness, no swelling, no edema and no spasm.       Back:  Lymphadenopathy:  She has no cervical adenopathy.  Neurological: She is alert and oriented to person, place, and time. She exhibits normal muscle tone. Gait normal.  Skin: Skin is warm, dry and intact. Capillary refill takes less than 2 seconds.  No rash noted. She is not diaphoretic. No pallor.  Psychiatric: She has a normal mood and affect. Her speech is normal and behavior is normal.  Nursing note and vitals reviewed.           Assessment & Plan:    1. Acute left-sided low back pain without sciatica  - methocarbamol (ROBAXIN-750) 750 MG tablet; Take 1 tablet (750 mg total) by mouth 4 (four) times daily.  Dispense: 30 tablet; Refill: 0 - traMADol (ULTRAM) 50 MG tablet; Take 1 tablet (50 mg total) by mouth every 8 (eight) hours as needed.  Dispense: 30 tablet; Refill: 0  I discussed with patient the precautions with tramadol and with Robaxin.  She was instructed not to take tramadol, Robaxin, or Ambien at the same time because the potentiating effects.  She was instructed not to drive on sedating medicines.  Suspect left low back strain, no radiculopathy or sciatica at this time, normal neuro and ROM, lumbar paraspinal muscles and left SI ttp, no midline tenderness, pt able to ambulate normally and get up and down off chair and exam table w/o difficulty.  Pt to continue OTC meds, add heating pad, muscle relaxer and tramadol sparingly for severe pain.  No strenuous activity for 1-2 weeks, after sx start to improve stressed gentle stretching. If pt develops radicular sx, will call us to be rechecked, currently do not feel steroids are indicated. Discussed healing time of 1-2 months, adding physical therapy may be beneficial, and if not improved she should see her spinal surgeon for eval.   Delsa Grana, PA-C 03/25/18 9:12 AM

## 2018-03-30 ENCOUNTER — Telehealth: Payer: Self-pay | Admitting: Gastroenterology

## 2018-03-30 NOTE — Telephone Encounter (Signed)
It would be best to evaluate this in clinic, assess for a fissure which I suspect is possible.  She did have a few days of discomfort post initial banding but she had thought it helped her bleeding symptoms. Can you book with either me if anything open soon or APP? Thanks

## 2018-03-30 NOTE — Telephone Encounter (Signed)
Patient scheduled for appointment with APP on 4/9 to evaluate rectal pain.

## 2018-03-30 NOTE — Telephone Encounter (Signed)
Left message for patient to call back needs to schedule an OV with APP.

## 2018-03-30 NOTE — Telephone Encounter (Signed)
Patient states that she is having pain with every bowel movement, no blood. She missed her last banding appointment, she is afraid of doing the banding again due to pain associated with it. She has tried OTC suppositories intermittently. She does state that while at work she uses imodium to keep from having bowel movements. Do you want her to schedule an OV with you or APP?

## 2018-04-05 ENCOUNTER — Ambulatory Visit: Payer: 59 | Admitting: Family Medicine

## 2018-04-05 ENCOUNTER — Ambulatory Visit: Payer: 59 | Admitting: Physician Assistant

## 2018-04-07 ENCOUNTER — Ambulatory Visit: Payer: 59 | Admitting: Gastroenterology

## 2018-04-07 ENCOUNTER — Telehealth: Payer: Self-pay

## 2018-04-07 NOTE — Progress Notes (Signed)
No show letter sent.

## 2018-04-07 NOTE — Telephone Encounter (Signed)
Called and LM for pt to call back to discuss whether she would like to continue receiving care with Dr. Havery Moros.  She has cancelled 3 banding appts with him and no showed her appt today.  She also cancelled an appt with Amy Esterwood this week. Failure to keep future appts will result in her being discharged from practice per Dr. Havery Moros

## 2018-04-12 NOTE — Telephone Encounter (Signed)
LM for pt to call back to discuss her appt on 05-03-18 with Dr. Havery Moros. Would she like to keep this appt or cancel? Is she still interested in being seen in our office? If so, she needs to keep her next appt.

## 2018-04-21 DIAGNOSIS — F332 Major depressive disorder, recurrent severe without psychotic features: Secondary | ICD-10-CM | POA: Diagnosis not present

## 2018-04-21 DIAGNOSIS — F411 Generalized anxiety disorder: Secondary | ICD-10-CM | POA: Diagnosis not present

## 2018-04-29 ENCOUNTER — Other Ambulatory Visit: Payer: Self-pay | Admitting: Family Medicine

## 2018-04-29 NOTE — Telephone Encounter (Signed)
Ok to refill??  Last office visit 02/14/2018.  Last refill 12/31/2017, #3 refills.

## 2018-05-03 ENCOUNTER — Encounter: Payer: 59 | Admitting: Gastroenterology

## 2018-06-07 DIAGNOSIS — Z6841 Body Mass Index (BMI) 40.0 and over, adult: Secondary | ICD-10-CM | POA: Diagnosis not present

## 2018-06-07 DIAGNOSIS — M5442 Lumbago with sciatica, left side: Secondary | ICD-10-CM | POA: Diagnosis not present

## 2018-06-22 ENCOUNTER — Other Ambulatory Visit: Payer: Self-pay | Admitting: *Deleted

## 2018-06-22 DIAGNOSIS — M545 Low back pain, unspecified: Secondary | ICD-10-CM

## 2018-06-22 MED ORDER — TRAMADOL HCL 50 MG PO TABS: 50.0000 mg | ORAL_TABLET | Freq: Three times a day (TID) | ORAL | 0 refills | Status: DC | PRN

## 2018-06-22 NOTE — Telephone Encounter (Signed)
Received call from patient.   Requested refill on Tramadol for back pain.   Ok to refill??  Last office visit/ refill 03/25/2018.

## 2018-07-11 ENCOUNTER — Emergency Department (HOSPITAL_COMMUNITY): Payer: 59

## 2018-07-11 ENCOUNTER — Emergency Department (HOSPITAL_COMMUNITY)
Admission: EM | Admit: 2018-07-11 | Discharge: 2018-07-11 | Disposition: A | Discharge status: Home / Self Care | Payer: 59 | Attending: Emergency Medicine | Admitting: Emergency Medicine

## 2018-07-11 ENCOUNTER — Other Ambulatory Visit: Payer: Self-pay

## 2018-07-11 ENCOUNTER — Encounter (HOSPITAL_COMMUNITY): Payer: Self-pay | Admitting: Emergency Medicine

## 2018-07-11 DIAGNOSIS — W010XXA Fall on same level from slipping, tripping and stumbling without subsequent striking against object, initial encounter: Secondary | ICD-10-CM | POA: Insufficient documentation

## 2018-07-11 DIAGNOSIS — Z91040 Latex allergy status: Secondary | ICD-10-CM | POA: Diagnosis not present

## 2018-07-11 DIAGNOSIS — I10 Essential (primary) hypertension: Secondary | ICD-10-CM | POA: Diagnosis not present

## 2018-07-11 DIAGNOSIS — M5442 Lumbago with sciatica, left side: Secondary | ICD-10-CM | POA: Insufficient documentation

## 2018-07-11 DIAGNOSIS — E119 Type 2 diabetes mellitus without complications: Secondary | ICD-10-CM | POA: Insufficient documentation

## 2018-07-11 DIAGNOSIS — Z79899 Other long term (current) drug therapy: Secondary | ICD-10-CM | POA: Diagnosis not present

## 2018-07-11 DIAGNOSIS — M545 Low back pain: Secondary | ICD-10-CM | POA: Diagnosis not present

## 2018-07-11 DIAGNOSIS — G8929 Other chronic pain: Secondary | ICD-10-CM | POA: Insufficient documentation

## 2018-07-11 DIAGNOSIS — W19XXXA Unspecified fall, initial encounter: Secondary | ICD-10-CM

## 2018-07-11 DIAGNOSIS — R0789 Other chest pain: Secondary | ICD-10-CM

## 2018-07-11 DIAGNOSIS — R079 Chest pain, unspecified: Secondary | ICD-10-CM | POA: Diagnosis not present

## 2018-07-11 MED ORDER — CYCLOBENZAPRINE HCL 10 MG PO TABS: 10.0000 mg | ORAL_TABLET | Freq: Two times a day (BID) | ORAL | 0 refills | Status: DC | PRN

## 2018-07-11 NOTE — ED Triage Notes (Signed)
Pt states she fell Friday and since has been having lower back pain that radiates down left leg.

## 2018-07-11 NOTE — Discharge Instructions (Signed)
Continue Tylenol and Aleve Try heating pad for sore muscles or topical medicines for pain Try Flexeril for spasms Please follow up with your doctor

## 2018-07-11 NOTE — ED Provider Notes (Signed)
[facility] EMERGENCY DEPARTMENT Provider Note   CSN: [id number] Arrival date & time: 07/11/18  2100     History   Chief Complaint Chief Complaint  Patient presents with  . Fall    HPI Sabrina Roberts is a 47 y.o. female who presents with chest pain and low back pain.  Past medical history significant for obesity, diabetes, hypertension, hyperlipidemia, chronic back pain.  She states that on Friday she tripped over a stump and fell forward.  Since then she is had some chest soreness and low back pain that feels like it is "catching".  She also has numbness from the left buttock to the left thigh. She has been taking Tylenol and Aleve for her pain.  She also does 11-hour shifts where she is on her feet all day.  She has some bruising on her right shin but this is improving she has been able to walk without difficulty.  No fever, syncope, trauma, unexplained weight loss, hx of cancer, loss of bowel/bladder function, saddle anesthesia, urinary retention, IVDU. Patient states that she follows orthopedics for her chronic back pain and she has had an MRI of her back which shows a herniated disc.  She recently completed a course of steroids. She has Tramadol but does not feel that this relieves her pain so doesn't take it.  HPI  Past Medical History:  Diagnosis Date  . Anemia   . Anxiety   . Arthritis   . Depression   . Diabetes mellitus   . Hyperlipidemia   . Hypertension   . Obesity   . Sleep apnea    Has CPAP but has lost weight and hasnt used    Patient Active Problem List   Diagnosis Date Noted  . Rectal bleeding 11/04/2017  . Diarrhea 11/04/2017  . Generalized abdominal pain 11/04/2017  . Thoracic disc herniation 09/06/2017  . Chronic back pain 09/06/2017  . Anemia 09/06/2017  . B12 deficiency 06/08/2017  . Vitamin D deficiency 06/08/2017  . Depression with anxiety 06/08/2017  . Bulging of lumbar intervertebral disc 06/08/2017  . Carpal tunnel syndrome of right wrist     . Borderline abnormal TFTs 10/29/2014  . Irregular menses 08/01/2013  . Pain in joint, ankle and foot 08/01/2013  . Stress reaction 10/14/2012  . Insomnia 10/14/2012  . Gynecological examination 05/03/2012  . Shoulder pain 05/03/2012  . DM (diabetes mellitus) ([place]) 02/28/2012  . Hyperlipidemia 02/28/2012  . Morbid obesity ([place]) 02/28/2012  . Thyroid nodule 02/28/2012    Past Surgical History:  Procedure Laterality Date  . CARPAL TUNNEL RELEASE Right 10/01/2016   Procedure: RIGHT CARPAL TUNNEL RELEASE;  Surgeon: Carole Civil, MD;  Location: [facility];  Service: Orthopedics;  Laterality: Right;  . CARPAL TUNNEL RELEASE Left 10/22/2016   Procedure: CARPAL TUNNEL RELEASE;  Surgeon: Carole Civil, MD;  Location: [facility];  Service: Orthopedics;  Laterality: Left;  . CESAREAN SECTION  2002  . CHOLECYSTECTOMY  dec 2012  . GASTRIC BYPASS  dec 2012  . KNEE ARTHROSCOPY Right      OB History   None      Home Medications    Prior to Admission medications   Medication Sig Start Date End Date Taking? Authorizing Provider  Blood Glucose Monitoring Suppl (BLOOD GLUCOSE SYSTEM PAK) KIT Please dispense based on patient and insurance preference. Use as directed to monitor FSBS 1x daily. Dx: E11.9. 04/22/16   Alycia Rossetti, MD  Cholecalciferol (VITAMIN D) 2000 units CAPS Take 1 capsule (  2,000 Units total) by mouth daily. 10/05/17   [place], Modena Nunnery, MD  colestipol (COLESTID) 1 g tablet Take 1 tablet (1 g total) by mouth 2 (two) times daily. 11/26/17   [place], Carlota Raspberry, MD  diazepam (VALIUM) 5 MG tablet Take one tablet by mouth 30 minutes prior to procedure, take other prn anxiety. Please have someone available to drive you while taking this medication. 01/06/18   [place], Carlota Raspberry, MD  dicyclomine (BENTYL) 20 MG tablet Take 1 tab twice daily. 11/04/17   Zehr, Laban Emperor, PA-C  escitalopram (LEXAPRO) 20 MG tablet Take 1 tablet (20 mg total) by mouth at bedtime. 01/05/18   Alycia Rossetti, MD  gabapentin (NEURONTIN) 300 MG capsule Take 1 capsule (300 mg total) by mouth at bedtime. 01/28/18   Alycia Rossetti, MD  Glucose Blood (BLOOD GLUCOSE TEST STRIPS) STRP Please dispense based on patient and insurance preference. Use as directed to monitor FSBS 1x daily. Dx: E11.9. 04/22/16   Alycia Rossetti, MD  Lancet Devices MISC Please dispense based on patient and insurance preference. Use as directed to monitor FSBS 1x daily. Dx: E11.9. 04/22/16   Alycia Rossetti, MD  Lancets MISC Please dispense based on patient and insurance preference. Use as directed to monitor FSBS 1x daily. Dx: E11.9. 04/22/16   Alycia Rossetti, MD  metFORMIN (GLUCOPHAGE) 500 MG tablet TAKE ONE TABLET BY MOUTH ONCE DAILY WITH BREAKFAST 01/01/17   [place], Modena Nunnery, MD  methocarbamol (ROBAXIN-750) 750 MG tablet Take 1 tablet (750 mg total) by mouth 4 (four) times daily. 03/25/18   Delsa Grana, PA-C  Multiple Vitamin (MULTIVITAMIN) tablet Take 1 tablet by mouth daily.     [provider]  naproxen sodium (ANAPROX) 220 MG tablet Take 440 mg by mouth daily as needed (pain).    [provider]  NEEDLE, DISP, 25 G 25G X 1" MISC Use to inject Vit B12 IM Q month 04/22/16   Alycia Rossetti, MD  promethazine (PHENERGAN) 12.5 MG tablet Take 1 tablet (12.5 mg total) by mouth every 4 (four) hours as needed for nausea or vomiting. 10/07/16   Carole Civil, MD  Syringe, Disposable, 3 ML MISC Use to inject Vit B12 IM Q month 04/22/16   Alycia Rossetti, MD  traMADol (ULTRAM) 50 MG tablet Take 1 tablet (50 mg total) by mouth every 8 (eight) hours as needed. 06/22/18   Alycia Rossetti, MD  zolpidem (AMBIEN) 10 MG tablet TAKE 1 TABLET BY MOUTH AT BEDTIME AS NEEDED 04/29/18   Alycia Rossetti, MD    Family History Family History  Problem Relation Age of Onset  . Hypertension Mother   . Depression Mother   . Diabetes Mother   . Irritable bowel syndrome Mother   . Heart disease Mother   . Stroke  Mother   . Hypertension Father   . Hyperlipidemia Father   . Prostate cancer Father   . Colon polyps Father   . Heart disease Father   . Kidney Stones Father   . Diverticulitis Father   . Colon cancer Father   . Breast cancer Paternal Grandmother   . Colon cancer Paternal Grandfather   . Diabetes Maternal Grandmother   . Breast cancer Maternal Aunt   . Stomach cancer Paternal Uncle   . Colon cancer Other        Paternal Great Uncles x 4  . Colon polyps Paternal Aunt        x 3  Social History Social History   Tobacco Use  . Smoking status: Never Smoker  . Smokeless tobacco: Never Used  Substance Use Topics  . Alcohol use: Yes    Alcohol/week: 0.0 oz    Comment: 2 drinks per month  . Drug use: No     Allergies   Actos [pioglitazone hydrochloride] and Latex   Review of Systems Review of Systems  Cardiovascular: Positive for chest pain.  Musculoskeletal: Positive for back pain.  Neurological: Positive for numbness. Negative for weakness.     Physical Exam Updated Vital Signs BP (!) 160/82 (BP Location: Right Arm)   Pulse (!) 59   Temp 98.2 F (36.8 C) (Temporal)   Resp 18   Ht _0  (1.626 m)   Wt 113.4 kg (250 lb)   SpO2 100%   BMI 42.91 kg/m   Physical Exam  Constitutional: She is oriented to person, place, and time. She appears well-developed and well-nourished. No distress.  Obese, calm, cooperative, no acute distress  HENT:  Head: Normocephalic and atraumatic.  Eyes: Pupils are equal, round, and reactive to light. Conjunctivae are normal. Right eye exhibits no discharge. Left eye exhibits no discharge. No scleral icterus.  Neck: Normal range of motion.  Cardiovascular: Normal rate and regular rhythm.  Pulmonary/Chest: Effort normal and breath sounds normal. No respiratory distress. She exhibits tenderness (Tenderness over the distal sternum).  Abdominal: Soft. Bowel sounds are normal. She exhibits no distension. There is no tenderness.    Musculoskeletal:  Back: Inspection: No masses, deformity, or rash Palpation: Lumbar midline spinal tenderness with left sided paraspinal muscle tenderness. Strength: 5/5 in lower extremities and normal plantar and dorsiflexion Sensation: Intact sensation with light touch in lower extremities bilaterally Reflexes: Patellar reflex is 1+ bilaterally SLR: Negative seated straight leg raise Gait: Normal gait   Neurological: She is alert and oriented to person, place, and time.  Skin: Skin is warm and dry.  Psychiatric: She has a normal mood and affect. Her behavior is normal.  Nursing note and vitals reviewed.    ED Treatments / Results  Labs (all labs ordered are listed, but only abnormal results are displayed) Labs Reviewed - No data to display  EKG None  Radiology Dg Chest 2 View  Result Date: 07/11/2018 CLINICAL DATA:  Patient fell last Wednesday and has had mid lower chest pain radiating to the left shoulder at times with dyspnea. EXAM: CHEST - 2 VIEW COMPARISON:  06/15/2011 FINDINGS: The heart size and mediastinal contours are within normal limits. Both lungs are clear. The visualized skeletal structures are unremarkable. IMPRESSION: No active cardiopulmonary disease. Electronically Signed   By: Ashley Royalty M.D.   On: 07/11/2018 22:15   Dg Lumbar Spine Complete  Result Date: 07/11/2018 CLINICAL DATA:  Patient fell tripping over something and landing for. Low back pain since. EXAM: LUMBAR SPINE - COMPLETE 4+ VIEW COMPARISON:  MRI 01/26/2018 FINDINGS: There is no evidence of lumbar spine fracture. Alignment is normal. Five non ribbed lumbar vertebrae are again noted. No listhesis or pars defects. Intervertebral disc spaces are maintained. Chronic degenerative disc disease and endplate changes of the lower thoracic spine from T10-T12, similar in appearance to prior MRI. Schmorl's nodes noted at T12 involving the superior and inferior endplates. Facet arthropathy is seen at all levels  of the lumbar spine hypertrophy and sclerosis. IMPRESSION: 1. No acute lumbar spine fracture or listhesis. Multilevel degenerative facet arthropathy of the lumbar spine. 2. Chronic degenerative disc disease of the included lower thoracic spine.  Electronically Signed   By: Ashley Royalty M.D.   On: 07/11/2018 22:19    Procedures Procedures (including critical care time)  Medications Ordered in ED Medications - No data to display   Initial Impression / Assessment and Plan / ED Course  I have reviewed the triage vital signs and the nursing notes.  Pertinent labs & imaging results that were available during my care of the patient were reviewed by me and considered in my medical decision making (see chart for details).  47 year old female with acute on chronic low back pain and chest wall pain after a fall several days ago.  She is mildly hypertensive in triage this has improved on recheck.  All other vital signs are normal.  On exam she has low back tenderness.  She has normal strength in no bowel or bladder incontinence.  She is ambulatory without significant difficulty.  She has tenderness to the chest wall well.  Heart is regular rate and rhythm and lungs are clear to auscultation.  Will obtain imaging of the area and reassess.  Lumbar x-ray is negative for acute fracture.  Chest x-rays normal.  MRI was reviewed from earlier this year which shows degenerative disc disease and herniated disks.  She was advised to do physical therapy and steroid injections of her pain was not improving.  She states that she does not feel she is at that point yet.  She does get good relief with Aleve however was advised not to take it because of gastric bypass.  She can take this as needed and alternate with Tylenol.  She was advised to try heating pads, topical medicines we will add a muscle relaxer.  We will not do steroids at this time since she is a diabetic and she just recently completed a course of steroids.   Results were discussed with the patient she verbalized understanding.   Final Clinical Impressions(s) / ED Diagnoses   Final diagnoses:  Fall, initial encounter  Chronic left-sided low back pain with left-sided sciatica  Chest wall pain    ED Discharge Orders    None       Recardo Evangelist, PA-C 07/11/18 2336    Hayden Rasmussen, MD 07/12/18 1112

## 2018-07-20 ENCOUNTER — Ambulatory Visit: Payer: 59 | Admitting: Family Medicine

## 2018-08-31 ENCOUNTER — Other Ambulatory Visit: Payer: Self-pay | Admitting: Family Medicine

## 2018-08-31 NOTE — Telephone Encounter (Signed)
Ok to refill??  Last office visit 03/25/2018.  Last refill 04/29/2018, #3 refills.

## 2018-09-01 ENCOUNTER — Other Ambulatory Visit: Payer: Self-pay | Admitting: *Deleted

## 2018-09-01 DIAGNOSIS — M545 Low back pain, unspecified: Secondary | ICD-10-CM

## 2018-09-01 MED ORDER — TRAMADOL HCL 50 MG PO TABS: 50.0000 mg | ORAL_TABLET | Freq: Three times a day (TID) | ORAL | 0 refills | Status: DC | PRN

## 2018-09-01 NOTE — Telephone Encounter (Signed)
Received call from patient.   Requested refill on Tramadol.   Ok to refill??  Last office visit 03/25/2018.  Last refill 06/22/2018.

## 2018-10-24 ENCOUNTER — Other Ambulatory Visit: Payer: Self-pay | Admitting: Family Medicine

## 2018-10-25 ENCOUNTER — Telehealth: Payer: Self-pay | Admitting: Family Medicine

## 2018-10-25 NOTE — Telephone Encounter (Signed)
Patient called in today with c/o of a non productive cough. She denies fever, chills, or SOB. States she had a cold about 2 weeks ago and still has a lingering cough. She stated that she had tried tessalon perles that she had left over from a previous time and some other otc cough medications with no relief. She is currently experiencing some bladder leakage during coughing spells. She would like to know if their is any thing that you recommend for her dry cough. She has an office visit scheduled for Friday but wanted to know what to do in the meantime. Please advise?

## 2018-10-26 NOTE — Telephone Encounter (Signed)
Try taking allergy pill, this can cause dry cough If she has any heartburn reflux, take prilosec 20mg  OTC once a day  At bedtime Delsym  F/U Friday for appointment

## 2018-10-27 NOTE — Telephone Encounter (Signed)
Spoke with patient and informed her of Dr.Statham's recommendation and advised her to keep appointment for tomorrow. Patient verbalized understanding.

## 2018-10-27 NOTE — Telephone Encounter (Signed)
Left message return call

## 2018-10-28 ENCOUNTER — Ambulatory Visit (INDEPENDENT_AMBULATORY_CARE_PROVIDER_SITE_OTHER): Payer: 59 | Admitting: Family Medicine

## 2018-10-28 ENCOUNTER — Encounter: Payer: Self-pay | Admitting: Family Medicine

## 2018-10-28 ENCOUNTER — Other Ambulatory Visit: Payer: Self-pay

## 2018-10-28 VITALS — BP 126/74 | HR 62 | Temp 98.6°F | Resp 12 | Ht 64.0 in | Wt 256.0 lb

## 2018-10-28 DIAGNOSIS — D649 Anemia, unspecified: Secondary | ICD-10-CM | POA: Diagnosis not present

## 2018-10-28 DIAGNOSIS — F410 Panic disorder [episodic paroxysmal anxiety] without agoraphobia: Secondary | ICD-10-CM

## 2018-10-28 DIAGNOSIS — E119 Type 2 diabetes mellitus without complications: Secondary | ICD-10-CM

## 2018-10-28 DIAGNOSIS — E559 Vitamin D deficiency, unspecified: Secondary | ICD-10-CM | POA: Diagnosis not present

## 2018-10-28 DIAGNOSIS — F418 Other specified anxiety disorders: Secondary | ICD-10-CM

## 2018-10-28 DIAGNOSIS — G2581 Restless legs syndrome: Secondary | ICD-10-CM | POA: Diagnosis not present

## 2018-10-28 MED ORDER — HYDROXYZINE PAMOATE 25 MG PO CAPS: 25.0000 mg | ORAL_CAPSULE | Freq: Three times a day (TID) | ORAL | 0 refills | Status: DC | PRN

## 2018-10-28 MED ORDER — PREGABALIN 75 MG PO CAPS
75.0000 mg | ORAL_CAPSULE | Freq: Every day | ORAL | 0 refills | Status: DC
Start: 2018-10-28 — End: 2018-11-10

## 2018-10-28 MED ORDER — GABAPENTIN 300 MG PO CAPS: 300.0000 mg | ORAL_CAPSULE | Freq: Every day | ORAL | 3 refills | Status: DC

## 2018-10-28 NOTE — Patient Instructions (Addendum)
Try switching from gabapentin to lyrica, see if it helps with your legs  Start lyrica 75 mg once and night, can increase dose, just follow up   I will call with labs - and may need to follow up if diabetes is worse, since you don't tolerate metformin very well  Work on diet and exercise  Try vistaril as needed for anxiety  Keep doing supplements for iron, B12 and Vit D

## 2018-10-28 NOTE — Progress Notes (Signed)
Patient ID: Sabrina Roberts, female    DOB: 26-Jan-1971, 47 y.o.   MRN: [id number]  PCP: Alycia Rossetti, MD  Chief Complaint  Patient presents with  . Follow-up    is fasting    Subjective:   Sabrina Roberts is a 47 y.o. female, presents to clinic with CC of  DM, anxiety and restless leg syndrome.    DM metformin 500 mg daily - hasn't taken in a couple of weeks-metformin is very hard in her stomach giving her stomach cramps and diarrhea.  She reports checking her blood sugars in the morning they are high 140-150, at night after eating 170's.  Due for repeat labs today  Complains about worsening anxiety symptoms and panic attacks that have been coming throughout the day worse anxiety - panic attacks.  She is on Lexapro for anxiety and depression, has a history of similar to when she is describing today, panic attacks, anxiety attacks, fatigue and difficulty sleeping, complaints of leg cramps at night and difficulty sleeping because of that.  She is taking Ambien for sleep,, this does help her get to sleep but she states that prior to taking it and sometimes afterwards she is up with bothersome sensations in her legs.    Restless leg symptoms - patient is taking gabapentin 300 to 600 mg at night but she reports taking it irregularly, it does not help very much.  Also on her chart is Robaxin, Flexeril, tramadol, Ambien and naproxen, she states these medicines are for chronic back pain, muscle cramps at insomnia  She has not seen her PCP since nearly 10 months ago.  Most of her complaints today are not new.  Patient is a difficult historian having trouble giving direct answers or describing history of her illness.  Most of her treatments with medication she is admittedly partially compliant with.    Chart review from 01/05/2018 visit and 09/06/2017 visit with her PCP appears that she does have long-standing anxiety and depression, gets worse related to stressors.  Past she is going to seek  counseling available for free through her employer.  Does not seem that she has done that.    Related to restless legs it does appear that she has a history of multiple deficiencies including vitamin D deficiency, iron deficiency B12 deficiency, some secondary to weight loss surgery, also has history of some bulging lumbar intervertebral disc and back pain.    Review of Systems  Constitutional: Negative.   HENT: Negative.   Eyes: Negative.   Respiratory: Negative.   Cardiovascular: Negative.   Gastrointestinal: Negative.   Endocrine: Negative.   Genitourinary: Negative.   Musculoskeletal: Negative.   Skin: Negative.   Allergic/Immunologic: Negative.   Neurological: Negative.   Hematological: Negative.   Psychiatric/Behavioral: Negative.   All other systems reviewed and are negative.      Objective:    Vitals:   10/28/18 1119  BP: 126/74  Pulse: 62  Resp: 12  Temp: 98.6 F (37 C)  TempSrc: Oral  SpO2: 98%  Weight: 256 lb (116.1 kg)  Height: 5\' 4"  (1.626 m)      Physical Exam  Constitutional: She appears well-developed and well-nourished. No distress.  HENT:  Head: Normocephalic and atraumatic.  Right Ear: External ear normal.  Left Ear: External ear normal.  Nose: Nose normal.  Mouth/Throat: Oropharynx is clear and moist.  Eyes: Pupils are equal, round, and reactive to light. Conjunctivae are normal. Right eye exhibits no discharge. Left eye exhibits  no discharge.  Neck: No tracheal deviation present.  Cardiovascular: Regular rhythm, normal heart sounds and intact distal pulses. Bradycardia present. Exam reveals no gallop and no friction rub.  No murmur heard. Pulmonary/Chest: Effort normal and breath sounds normal. No stridor. No respiratory distress.  Abdominal: Soft. Bowel sounds are normal.  Musculoskeletal: Normal range of motion. She exhibits no edema or tenderness.  Bilateral lower extremities, normal pulses, normal sensation to light touch, all muscle  compartments soft to palpation  Neurological: She is alert. She exhibits normal muscle tone. Coordination normal.  Skin: Skin is warm and dry. No rash noted. She is not diaphoretic.  Psychiatric: Her behavior is normal. Her mood appears anxious. Her affect is not angry, not blunt, not labile and not inappropriate. Her speech is rapid and/or pressured. Thought content is not paranoid and not delusional. She does not exhibit a depressed mood. She expresses no homicidal and no suicidal ideation. She expresses no suicidal plans and no homicidal plans.  Nursing note and vitals reviewed.         Assessment & Plan:     ICD-10-CM   1. Panic attacks F41.0 hydrOXYzine (VISTARIL) 25 MG capsule    Ambulatory referral to Psychiatry  2. Restless legs syndrome (RLS) O70.78 COMPLETE METABOLIC PANEL WITH GFR    pregabalin (LYRICA) 75 MG capsule   Gabapentin and effective, will slowly DC and replaced with Lyrica trial  3. Depression with anxiety F41.8 hydrOXYzine (VISTARIL) 25 MG capsule    Ambulatory referral to Psychiatry  4. Type 2 diabetes mellitus without complication, without long-term current use of insulin (HCC) M75.4 COMPLETE METABOLIC PANEL WITH GFR    Hemoglobin A1c  5. Anemia, unspecified type D64.9 CBC with Differential/Platelet   Recheck CBC  6. Vitamin D deficiency E55.9 VITAMIN D 25 Hydroxy (Vit-D Deficiency, Fractures)   Recheck vitamin D levels     Discussed with patient her multiple symptoms today and feel that her psychiatric problems especially panic attacks would best be addressed by combining some kind of cognitive behavioral therapy or talk therapy with medications.  We will do a trial of Vistaril 3 times daily as needed, but I did strongly encourage her to contact through her insurance psychology or psychiatry specialist to help her have less panic attacks and anxiety attacks.  Trial of Vistaril  Restless leg syndrome may be from her deficiencies, will need to review her chart to  see how recently labs were done what treatments were done in the past, will d/c gabapentin and start Lyrica trial - basic labs done   Diabetes, patient not taking metformin, recheck chemistry and A1c    Delsa Grana, PA-C 10/28/18 11:42 AM

## 2018-10-29 LAB — COMPLETE METABOLIC PANEL WITH GFR
AG Ratio: 1.5 (calc) (ref 1.0–2.5)
ALT: 9 U/L (ref 6–29)
AST: 12 U/L (ref 10–35)
Albumin: 3.9 g/dL (ref 3.6–5.1)
Alkaline phosphatase (APISO): 98 U/L (ref 33–115)
BUN: 10 mg/dL (ref 7–25)
CO2: 25 mmol/L (ref 20–32)
Calcium: 8.6 mg/dL (ref 8.6–10.2)
Chloride: 106 mmol/L (ref 98–110)
Creat: 0.64 mg/dL (ref 0.50–1.10)
GFR, Est African American: 123 mL/min/{1.73_m2} (ref >=60)
GFR, Est Non African American: 106 mL/min/{1.73_m2} (ref >=60)
Globulin: 2.6 g/dL (calc) (ref 1.9–3.7)
Glucose, Bld: 100 mg/dL — ABNORMAL HIGH (ref 65–99)
Potassium: 4.2 mmol/L (ref 3.5–5.3)
Sodium: 140 mmol/L (ref 135–146)
Total Bilirubin: 0.6 mg/dL (ref 0.2–1.2)
Total Protein: 6.5 g/dL (ref 6.1–8.1)

## 2018-10-29 LAB — CBC WITH DIFFERENTIAL/PLATELET
Basophils Absolute: 50 cells/uL (ref 0–200)
Basophils Relative: 0.7 %
Eosinophils Absolute: 121 cells/uL (ref 15–500)
Eosinophils Relative: 1.7 %
HCT: 32.2 % — ABNORMAL LOW (ref 35.0–45.0)
Hemoglobin: 10.1 g/dL — ABNORMAL LOW (ref 11.7–15.5)
Lymphs Abs: 2251 cells/uL (ref 850–3900)
MCH: 26.3 pg — ABNORMAL LOW (ref 27.0–33.0)
MCHC: 31.4 g/dL — ABNORMAL LOW (ref 32.0–36.0)
MCV: 83.9 fL (ref 80.0–100.0)
MPV: 11.7 fL (ref 7.5–12.5)
Monocytes Relative: 10.4 %
Neutro Abs: 3941 cells/uL (ref 1500–7800)
Neutrophils Relative %: 55.5 %
Platelets: 329 10*3/uL (ref 140–400)
RBC: 3.84 10*6/uL (ref 3.80–5.10)
RDW: 13.9 % (ref 11.0–15.0)
Total Lymphocyte: 31.7 %
WBC mixed population: 738 cells/uL (ref 200–950)
WBC: 7.1 10*3/uL (ref 3.8–10.8)

## 2018-10-29 LAB — HEMOGLOBIN A1C
Hgb A1c MFr Bld: 6.6 % of total Hgb — ABNORMAL HIGH (ref <5.7)
Mean Plasma Glucose: 143 (calc)
eAG (mmol/L): 7.9 (calc)

## 2018-10-29 LAB — VITAMIN D 25 HYDROXY (VIT D DEFICIENCY, FRACTURES): Vit D, 25-Hydroxy: 11 ng/mL — ABNORMAL LOW (ref 30–100)

## 2018-10-31 ENCOUNTER — Encounter: Payer: Self-pay | Admitting: Family Medicine

## 2018-10-31 NOTE — Assessment & Plan Note (Signed)
Noncompliant with metformin, having GI side effects, will recheck CMP and A1c

## 2018-11-04 ENCOUNTER — Other Ambulatory Visit: Payer: Self-pay | Admitting: *Deleted

## 2018-11-04 MED ORDER — VITAMIN D (ERGOCALCIFEROL) 1.25 MG (50000 UNIT) PO CAPS: 50000.0000 [IU] | ORAL_CAPSULE | ORAL | 0 refills | Status: DC

## 2018-11-04 NOTE — Progress Notes (Signed)
Yes, thank you.

## 2018-11-10 ENCOUNTER — Ambulatory Visit (INDEPENDENT_AMBULATORY_CARE_PROVIDER_SITE_OTHER): Payer: 59 | Admitting: Family Medicine

## 2018-11-10 ENCOUNTER — Other Ambulatory Visit: Payer: Self-pay

## 2018-11-10 ENCOUNTER — Encounter: Payer: Self-pay | Admitting: Family Medicine

## 2018-11-10 VITALS — BP 122/68 | HR 82 | Temp 98.3°F | Resp 14 | Ht 64.0 in | Wt 251.0 lb

## 2018-11-10 DIAGNOSIS — F418 Other specified anxiety disorders: Secondary | ICD-10-CM

## 2018-11-10 DIAGNOSIS — R197 Diarrhea, unspecified: Secondary | ICD-10-CM

## 2018-11-10 DIAGNOSIS — E538 Deficiency of other specified B group vitamins: Secondary | ICD-10-CM | POA: Diagnosis not present

## 2018-11-10 DIAGNOSIS — D649 Anemia, unspecified: Secondary | ICD-10-CM | POA: Diagnosis not present

## 2018-11-10 DIAGNOSIS — G2581 Restless legs syndrome: Secondary | ICD-10-CM

## 2018-11-10 DIAGNOSIS — F410 Panic disorder [episodic paroxysmal anxiety] without agoraphobia: Secondary | ICD-10-CM

## 2018-11-10 DIAGNOSIS — D509 Iron deficiency anemia, unspecified: Secondary | ICD-10-CM

## 2018-11-10 MED ORDER — HYDROXYZINE PAMOATE 25 MG PO CAPS: 25.0000 mg | ORAL_CAPSULE | Freq: Three times a day (TID) | ORAL | 0 refills | Status: DC | PRN

## 2018-11-10 MED ORDER — PREGABALIN 150 MG PO CAPS: 150.0000 mg | ORAL_CAPSULE | Freq: Every day | ORAL | 0 refills | Status: DC

## 2018-11-10 NOTE — Patient Instructions (Addendum)
I will refill vistaril when you are close to being out  Increase lyrica to 150 mg once by mouth at night for leg symptoms, let me know if this is more effective  Will call you with lab results -if B12 is low again we can start shots after we have the lab results.  Hold iron from now to early next week, do stool card and return next week.

## 2018-11-10 NOTE — Progress Notes (Signed)
Patient ID: Sabrina Roberts, female    DOB: Nov 18, 1971, 47 y.o.   MRN: [id number]  PCP: Alycia Rossetti, MD  Chief Complaint  Patient presents with  . Follow-up    anemia    Subjective:   Sabrina Roberts is a 47 y.o. female, presents to clinic for follow up on abnormal labs - worsening anemia.  Hb decreased from 11 to 10.1 with other routine labs.  Has hx of gastric bipass, vit d deficiency, b12 deficiency.  Hx of rectal bleed from hemorrhoids, colonoscopy 11/16/17 - reviewed   RLS -improved a little bit with decreased cramping at night and slightly decreased symptoms with changing to Lyrica 75 mg at bedtime A few days ago she did have severe cramps at work from her sleep and then when she woke up later in the morning she did have sore muscles in her calves  She wants B12 injection but hasn't had for about a year - last lab a year ago showed elevated B12.   Patient Active Problem List   Diagnosis Date Noted  . Rectal bleeding 11/04/2017  . Diarrhea 11/04/2017  . Generalized abdominal pain 11/04/2017  . Thoracic disc herniation 09/06/2017  . Chronic back pain 09/06/2017  . Anemia 09/06/2017  . B12 deficiency 06/08/2017  . Vitamin D deficiency 06/08/2017  . Depression with anxiety 06/08/2017  . Bulging of lumbar intervertebral disc 06/08/2017  . Carpal tunnel syndrome of right wrist   . Borderline abnormal TFTs 10/29/2014  . Irregular menses 08/01/2013  . Pain in joint, ankle and foot 08/01/2013  . Stress reaction 10/14/2012  . Insomnia 10/14/2012  . Gynecological examination 05/03/2012  . Shoulder pain 05/03/2012  . DM (diabetes mellitus) ([place]) 02/28/2012  . Hyperlipidemia 02/28/2012  . Morbid obesity ([place]) 02/28/2012  . Thyroid nodule 02/28/2012     Prior to Admission medications   Medication Sig Start Date End Date Taking? Authorizing Provider  Blood Glucose Monitoring Suppl (BLOOD GLUCOSE SYSTEM PAK) KIT Please dispense based on patient and insurance  preference. Use as directed to monitor FSBS 1x daily. Dx: E11.9. 04/22/16  Yes [place], Modena Nunnery, MD  colestipol (COLESTID) 1 g tablet Take 1 tablet (1 g total) by mouth 2 (two) times daily. 11/26/17  Yes [place], Carlota Raspberry, MD  cyclobenzaprine (FLEXERIL) 10 MG tablet Take 1 tablet (10 mg total) by mouth 2 (two) times daily as needed for muscle spasms. 07/11/18  Yes Recardo Evangelist, PA-C  escitalopram (LEXAPRO) 20 MG tablet Take 20 mg by mouth daily.   Yes [provider]  ferrous sulfate 325 (65 FE) MG tablet Take 325 mg by mouth daily with breakfast.   Yes [provider]  Glucose Blood (BLOOD GLUCOSE TEST STRIPS) STRP Please dispense based on patient and insurance preference. Use as directed to monitor FSBS 1x daily. Dx: E11.9. 04/22/16  Yes [place], Modena Nunnery, MD  hydrOXYzine (VISTARIL) 25 MG capsule Take 1 capsule (25 mg total) by mouth 3 (three) times daily as needed for anxiety. 10/28/18  Yes Delsa Grana, PA-C  Lancet Devices MISC Please dispense based on patient and insurance preference. Use as directed to monitor FSBS 1x daily. Dx: E11.9. 04/22/16  Yes Alycia Rossetti, MD  metFORMIN (GLUCOPHAGE) 500 MG tablet TAKE ONE TABLET BY MOUTH ONCE DAILY WITH BREAKFAST 01/01/17  Yes [place], Modena Nunnery, MD  methocarbamol (ROBAXIN-750) 750 MG tablet Take 1 tablet (750 mg total) by mouth 4 (four) times daily. 03/25/18  Yes Delsa Grana, PA-C  Multiple  Vitamin (MULTIVITAMIN) tablet Take 1 tablet by mouth daily.    Yes [provider]  naproxen sodium (ANAPROX) 220 MG tablet Take 440 mg by mouth daily as needed (pain).   Yes [provider]  pregabalin (LYRICA) 75 MG capsule Take 1 capsule (75 mg total) by mouth at bedtime. 10/28/18  Yes Delsa Grana, PA-C  traMADol (ULTRAM) 50 MG tablet Take 1 tablet (50 mg total) by mouth every 8 (eight) hours as needed. 09/01/18  Yes Susy Frizzle, MD  Vitamin D, Ergocalciferol, (DRISDOL) 1.25 MG (50000 UT) CAPS capsule Take 1 capsule  (50,000 Units total) by mouth every 7 (seven) days. x12 weeks. 11/04/18  Yes Delsa Grana, PA-C  zolpidem (AMBIEN) 10 MG tablet TAKE 1 TABLET BY MOUTH AT BEDTIME AS NEEDED 08/31/18  Yes Orlena Sheldon, PA-C     Allergies  Allergen Reactions  . Actos [Pioglitazone Hydrochloride] Anaphylaxis, Hives and Other (See Comments)    Chest pain   . Latex Rash     Family History  Problem Relation Age of Onset  . Hypertension Mother   . Depression Mother   . Diabetes Mother   . Irritable bowel syndrome Mother   . Heart disease Mother   . Stroke Mother   . Hypertension Father   . Hyperlipidemia Father   . Prostate cancer Father   . Colon polyps Father   . Heart disease Father   . Kidney Stones Father   . Diverticulitis Father   . Colon cancer Father   . Breast cancer Paternal Grandmother   . Colon cancer Paternal Grandfather   . Diabetes Maternal Grandmother   . Breast cancer Maternal Aunt   . Stomach cancer Paternal Uncle   . Colon cancer Other        Paternal Great Uncles x 4  . Colon polyps Paternal Aunt        x 3     Social History   Socioeconomic History  . Marital status: Married    Spouse name: Not on file  . Number of children: Not on file  . Years of education: Not on file  . Highest education level: Not on file  Occupational History  . Not on file  Social Needs  . Financial resource strain: Not on file  . Food insecurity:    Worry: Not on file    Inability: Not on file  . Transportation needs:    Medical: Not on file    Non-medical: Not on file  Tobacco Use  . Smoking status: Never Smoker  . Smokeless tobacco: Never Used  Substance and Sexual Activity  . Alcohol use: Yes    Alcohol/week: 0.0 standard drinks    Comment: 2 drinks per month  . Drug use: No  . Sexual activity: Yes    Partners: Male    Birth control/protection: None  Lifestyle  . Physical activity:    Days per week: Not on file    Minutes per session: Not on file  . Stress: Not on file    Relationships  . Social connections:    Talks on phone: Not on file    Gets together: Not on file    Attends religious service: Not on file    Active member of club or organization: Not on file    Attends meetings of clubs or organizations: Not on file    Relationship status: Not on file  . Intimate partner violence:    Fear of current or ex partner: Not  on file    Emotionally abused: Not on file    Physically abused: Not on file    Forced sexual activity: Not on file  Other Topics Concern  . Not on file  Social History Narrative  . Not on file     Review of Systems  Constitutional: Negative.   HENT: Negative.   Eyes: Negative.   Respiratory: Negative.   Cardiovascular: Negative.   Gastrointestinal: Negative.   Endocrine: Negative.   Genitourinary: Negative.   Musculoskeletal: Negative.   Skin: Negative.   Allergic/Immunologic: Negative.   Neurological: Negative.   Hematological: Negative.   Psychiatric/Behavioral: Negative.   All other systems reviewed and are negative.      Objective:    Vitals:   11/10/18 1440  BP: 122/68  Pulse: 82  Resp: 14  Temp: 98.3 F (36.8 C)  TempSrc: Oral  SpO2: 100%  Weight: 251 lb (113.9 kg)  Height: '5\' 4"'  (1.626 m)      Physical Exam  Constitutional: She appears well-developed and well-nourished. No distress.  HENT:  Head: Normocephalic and atraumatic.  Right Ear: External ear normal.  Left Ear: External ear normal.  Nose: Nose normal.  Mouth/Throat: Oropharynx is clear and moist. No oropharyngeal exudate.  No pallor  Eyes: Pupils are equal, round, and reactive to light. Conjunctivae are normal. Right eye exhibits no discharge. Left eye exhibits no discharge.  Neck: No tracheal deviation present.  Cardiovascular: Normal rate, regular rhythm, normal heart sounds and intact distal pulses. Exam reveals no gallop and no friction rub.  No murmur heard. Pulmonary/Chest: Effort normal and breath sounds normal. No stridor.  No respiratory distress. She has no wheezes. She has no rales.  Abdominal: Soft. Bowel sounds are normal. She exhibits no distension and no mass. There is no tenderness. There is no guarding.  Musculoskeletal: Normal range of motion. She exhibits no edema or tenderness.  Bilateral lower extremities, normal pulses, normal sensation to light touch, all muscle compartments soft to palpation  Neurological: She is alert. She exhibits normal muscle tone. Coordination normal.  Skin: Skin is warm and dry. Capillary refill takes less than 2 seconds. No rash noted. She is not diaphoretic. No pallor.  Psychiatric: Her behavior is normal. Her mood appears anxious (mildly). Her affect is not angry, not blunt, not labile and not inappropriate. Her speech is not rapid and/or pressured. Thought content is not paranoid and not delusional. She does not exhibit a depressed mood. She expresses no homicidal and no suicidal ideation. She expresses no suicidal plans and no homicidal plans.  Nursing note and vitals reviewed.         Assessment & Plan:    Pt returns for follow up on slightly worse anemia, has hx of gastric bypass surgery and chronic diarrhea, she also has hx of multiple nutritional deficiencies, RLS, anxiety/depresion and panic attacks.  Is established with GI - last year had BRBPR dx with hemorrhoids.  No suspected source of blood loss I do believe it is likely secondary to nutritional deficiencies, we will recheck those that would be appropriate to recheck at this time, will not check vitamin D again.   Hemoccult card done outpatient and to be return next week after holding oral iron  Screening for deficiencies may also find problems we can fix that would improve her restless leg symptoms.    ICD-10-CM   1. Normocytic anemia D64.9 TSH    CBC (INCLUDES DIFF/PLT) WITH PATHOLOGIST REVIEW    Iron, TIBC and Ferritin Panel  B12 and Folate Panel    Fecal Globin By Immunochemistry  2. Diarrhea,  unspecified type R19.7 Ambulatory referral to Gastroenterology  3. B12 deficiency E53.8   4. Depression with anxiety F41.8 hydrOXYzine (VISTARIL) 25 MG capsule  5. Panic attacks F41.0 hydrOXYzine (VISTARIL) 25 MG capsule  6. Restless legs syndrome (RLS) G25.81 TSH    Iron, TIBC and Ferritin Panel    B12 and Folate Panel    pregabalin (LYRICA) 150 MG capsule   Gabapentin and effective, will slowly DC and replaced with Lyrica trial  7. Iron deficiency anemia, unspecified iron deficiency anemia type D50.9 Ambulatory referral to Gastroenterology    Patient noted that Vistaril was effective for helping her with her panic attacks and anxiety attacks.  There lyrica trial did help with her RLS symptoms, will increase Lyrica dose at night to 150.  Delsa Grana, PA-C 11/10/18 3:08 PM

## 2018-11-11 LAB — CBC (INCLUDES DIFF/PLT) WITH PATHOLOGIST REVIEW
Basophils Absolute: 60 cells/uL (ref 0–200)
Basophils Relative: 0.8 %
Eosinophils Absolute: 203 cells/uL (ref 15–500)
Eosinophils Relative: 2.7 %
HCT: 34.9 % — ABNORMAL LOW (ref 35.0–45.0)
Hemoglobin: 10.9 g/dL — ABNORMAL LOW (ref 11.7–15.5)
Lymphs Abs: 3300 cells/uL (ref 850–3900)
MCH: 26.3 pg — ABNORMAL LOW (ref 27.0–33.0)
MCHC: 31.2 g/dL — ABNORMAL LOW (ref 32.0–36.0)
MCV: 84.3 fL (ref 80.0–100.0)
MPV: 11.8 fL (ref 7.5–12.5)
Monocytes Relative: 8.8 %
Neutro Abs: 3278 cells/uL (ref 1500–7800)
Neutrophils Relative %: 43.7 %
Platelets: 373 10*3/uL (ref 140–400)
RBC: 4.14 10*6/uL (ref 3.80–5.10)
RDW: 14.1 % (ref 11.0–15.0)
Total Lymphocyte: 44 %
WBC mixed population: 660 cells/uL (ref 200–950)
WBC: 7.5 10*3/uL (ref 3.8–10.8)

## 2018-11-11 LAB — IRON,TIBC AND FERRITIN PANEL
%SAT: 12 % (calc) — ABNORMAL LOW (ref 16–45)
Ferritin: 9 ng/mL — ABNORMAL LOW (ref 16–232)
Iron: 52 ug/dL (ref 40–190)
TIBC: 435 mcg/dL (calc) (ref 250–450)

## 2018-11-11 LAB — B12 AND FOLATE PANEL
Folate: 8.4 ng/mL
Vitamin B-12: 239 pg/mL (ref 200–1100)

## 2018-11-11 LAB — TSH: TSH: 0.3 mIU/L — ABNORMAL LOW

## 2018-11-15 ENCOUNTER — Encounter: Payer: Self-pay | Admitting: Family Medicine

## 2018-11-15 DIAGNOSIS — R7989 Other specified abnormal findings of blood chemistry: Secondary | ICD-10-CM | POA: Insufficient documentation

## 2018-11-16 ENCOUNTER — Other Ambulatory Visit: Payer: Self-pay | Admitting: Family Medicine

## 2018-11-16 DIAGNOSIS — R7989 Other specified abnormal findings of blood chemistry: Secondary | ICD-10-CM

## 2018-11-16 NOTE — Progress Notes (Signed)
Pt is coming to drop off stool lab - was unable to add on additional thyroid tests for Low TSH    ICD-10-CM   1. Abnormal TSH R79.89 T4, free    T3, free

## 2018-11-28 ENCOUNTER — Ambulatory Visit: Payer: 59 | Admitting: Family Medicine

## 2018-12-07 ENCOUNTER — Encounter: Payer: Self-pay | Admitting: Family Medicine

## 2018-12-23 ENCOUNTER — Other Ambulatory Visit: Payer: Self-pay | Admitting: *Deleted

## 2018-12-23 MED ORDER — ZOLPIDEM TARTRATE 10 MG PO TABS: 10.0000 mg | ORAL_TABLET | Freq: Every evening | ORAL | 3 refills | Status: DC | PRN

## 2018-12-23 NOTE — Telephone Encounter (Signed)
Received fax requesting refill on Ambien.   Ok to refill??  Last office visit 11/10/2018.  Last refill 08/31/2018, #3 refills.

## 2019-01-02 ENCOUNTER — Telehealth (HOSPITAL_COMMUNITY): Payer: Self-pay | Admitting: Family Medicine

## 2019-01-02 MED ORDER — FLUCONAZOLE 150 MG PO TABS: 150.0000 mg | ORAL_TABLET | Freq: Every day | ORAL | 0 refills | Status: DC

## 2019-01-02 MED ORDER — AMOXICILLIN-POT CLAVULANATE 875-125 MG PO TABS: 1.0000 | ORAL_TABLET | Freq: Two times a day (BID) | ORAL | 0 refills | Status: DC

## 2019-01-02 NOTE — Telephone Encounter (Signed)
2 weeks of sinus congestion, cough and fatigue Using OTC medication without relief.

## 2019-01-02 NOTE — Telephone Encounter (Signed)
Diflucan to prevent yeast infection with antibiotic use

## 2019-01-03 ENCOUNTER — Telehealth (HOSPITAL_COMMUNITY): Payer: Self-pay | Admitting: Family Medicine

## 2019-01-03 MED ORDER — BENZONATATE 200 MG PO CAPS: 200.0000 mg | ORAL_CAPSULE | Freq: Two times a day (BID) | ORAL | 0 refills | Status: DC | PRN

## 2019-01-03 NOTE — Telephone Encounter (Signed)
Requests cough medicine Sent in tessalon with instructions

## 2019-01-23 ENCOUNTER — Other Ambulatory Visit: Payer: Self-pay | Admitting: Family Medicine

## 2019-01-23 DIAGNOSIS — F418 Other specified anxiety disorders: Secondary | ICD-10-CM

## 2019-01-23 DIAGNOSIS — G2581 Restless legs syndrome: Secondary | ICD-10-CM

## 2019-01-23 DIAGNOSIS — R7989 Other specified abnormal findings of blood chemistry: Secondary | ICD-10-CM

## 2019-01-23 DIAGNOSIS — F410 Panic disorder [episodic paroxysmal anxiety] without agoraphobia: Secondary | ICD-10-CM

## 2019-01-24 ENCOUNTER — Other Ambulatory Visit: Payer: Self-pay | Admitting: Family Medicine

## 2019-01-24 DIAGNOSIS — R7989 Other specified abnormal findings of blood chemistry: Secondary | ICD-10-CM

## 2019-01-24 MED ORDER — PREGABALIN 150 MG PO CAPS: 150.0000 mg | ORAL_CAPSULE | Freq: Every day | ORAL | 0 refills | Status: DC

## 2019-01-24 NOTE — Telephone Encounter (Signed)
To review what we talked about after you talked to the pt-  Lyrica - she had a 30 d supply, 150 mg to take QHS, Rx in November, and pt today stated she is taking daily.  She did not say she ran out, correct?  If she still has meds somehow to take daily - we can refill at current dose for RLS.  If she was out - we need to taper up the dose from 0 - 75 mg - 150 mg again.  I will check the drug database - lyrica should be on there.  For thyroid - did you add the TSH future lab?

## 2019-01-24 NOTE — Telephone Encounter (Signed)
Left message return call

## 2019-01-24 NOTE — Telephone Encounter (Signed)
11/10/2018 1 11/10/2018 Pregabalin 150 Mg Capsule 30.00 30 [place] [id number] [facility] ([id number]) 0/0 1.00 LME Comm Ins [place]   10/29/2018 1 10/28/2018 Pregabalin 75 Mg Capsule 60.00 60 [place] [id number] [facility] ([id number]) 0/0 0.50 LME Comm Ins [place]\   Record per controlled substance database  Will rx 150 mg lyrica take daily at bedtime, 90 d supply, 0 refills, needs to follow up with PCP

## 2019-01-24 NOTE — Telephone Encounter (Signed)
Patient coming in to have her labs drawn on Friday and stated that she will schedule an appointment for follow up when she comes in for labs.

## 2019-01-24 NOTE — Addendum Note (Signed)
Addended by: Delsa Grana on: 01/24/2019 01:42 PM   Modules accepted: Orders

## 2019-01-24 NOTE — Telephone Encounter (Signed)
Please call patient - she needs to come have thyroid labs completed.  Abnormal thyroid could be causing some of her symptoms that these meds (hydroxyzine etc) are treating.    Pt should get labs done and come into office to do a follow up.  Can you see when and how the pt is taking lyrica?  Its supposed to be daily, not PRN.

## 2019-01-27 NOTE — Telephone Encounter (Signed)
Left message return call

## 2019-02-03 ENCOUNTER — Other Ambulatory Visit: Payer: 59

## 2019-02-10 ENCOUNTER — Other Ambulatory Visit: Payer: 59

## 2019-02-10 DIAGNOSIS — R7989 Other specified abnormal findings of blood chemistry: Secondary | ICD-10-CM

## 2019-02-11 LAB — TSH: TSH: 0.27 mIU/L — ABNORMAL LOW

## 2019-02-11 LAB — T4, FREE: Free T4: 1.1 ng/dL (ref 0.8–1.8)

## 2019-02-11 LAB — T3, FREE: T3, Free: 2.6 pg/mL (ref 2.3–4.2)

## 2019-02-17 ENCOUNTER — Other Ambulatory Visit: Payer: Self-pay | Admitting: Family Medicine

## 2019-02-17 DIAGNOSIS — M545 Low back pain, unspecified: Secondary | ICD-10-CM

## 2019-02-17 MED ORDER — TRAMADOL HCL 50 MG PO TABS: 50.0000 mg | ORAL_TABLET | Freq: Three times a day (TID) | ORAL | 0 refills | Status: DC | PRN

## 2019-02-17 NOTE — Telephone Encounter (Signed)
Ok to refill??  Last office visit 11/10/2018.  Last refill 09/01/2018.

## 2019-03-21 ENCOUNTER — Ambulatory Visit (INDEPENDENT_AMBULATORY_CARE_PROVIDER_SITE_OTHER): Payer: 59 | Admitting: Family Medicine

## 2019-03-21 ENCOUNTER — Other Ambulatory Visit: Payer: Self-pay

## 2019-03-21 ENCOUNTER — Encounter: Payer: Self-pay | Admitting: Family Medicine

## 2019-03-21 VITALS — BP 122/62 | HR 62 | Temp 98.1°F | Resp 14 | Ht 64.0 in | Wt 261.0 lb

## 2019-03-21 DIAGNOSIS — G8929 Other chronic pain: Secondary | ICD-10-CM

## 2019-03-21 DIAGNOSIS — M5126 Other intervertebral disc displacement, lumbar region: Secondary | ICD-10-CM | POA: Diagnosis not present

## 2019-03-21 DIAGNOSIS — R7989 Other specified abnormal findings of blood chemistry: Secondary | ICD-10-CM

## 2019-03-21 DIAGNOSIS — E119 Type 2 diabetes mellitus without complications: Secondary | ICD-10-CM | POA: Diagnosis not present

## 2019-03-21 DIAGNOSIS — M5136 Other intervertebral disc degeneration, lumbar region: Secondary | ICD-10-CM

## 2019-03-21 DIAGNOSIS — M545 Low back pain, unspecified: Secondary | ICD-10-CM

## 2019-03-21 DIAGNOSIS — E559 Vitamin D deficiency, unspecified: Secondary | ICD-10-CM

## 2019-03-21 DIAGNOSIS — E538 Deficiency of other specified B group vitamins: Secondary | ICD-10-CM | POA: Diagnosis not present

## 2019-03-21 DIAGNOSIS — F418 Other specified anxiety disorders: Secondary | ICD-10-CM | POA: Diagnosis not present

## 2019-03-21 DIAGNOSIS — M51369 Other intervertebral disc degeneration, lumbar region without mention of lumbar back pain or lower extremity pain: Secondary | ICD-10-CM

## 2019-03-21 MED ORDER — HYDROCODONE-ACETAMINOPHEN 5-325 MG PO TABS: 1.0000 | ORAL_TABLET | Freq: Four times a day (QID) | ORAL | 0 refills | Status: DC | PRN

## 2019-03-21 MED ORDER — BUSPIRONE HCL 7.5 MG PO TABS: 7.5000 mg | ORAL_TABLET | Freq: Two times a day (BID) | ORAL | 3 refills | Status: DC

## 2019-03-21 MED ORDER — CYANOCOBALAMIN 1000 MCG/ML IJ SOLN
1000.0000 ug | Freq: Once | INTRAMUSCULAR | Status: AC
  Administered 2019-03-21: 1000 ug via INTRAMUSCULAR

## 2019-03-21 NOTE — Patient Instructions (Signed)
Hydrocodone for back Take the steroid dose pak  Add buspar twice a day for anxiety Continue the lexapro

## 2019-03-21 NOTE — Assessment & Plan Note (Signed)
F/u with her orthopedic surgeon Can start the medrol dose pak, given norco

## 2019-03-21 NOTE — Assessment & Plan Note (Signed)
Check A1C today, goal less than 7% Discussed dietary changes, she has history of gastric bypass Did not tolerate metformin Consider GLP1 therapy

## 2019-03-21 NOTE — Assessment & Plan Note (Signed)
Low B12 level in setting of gastric bypass, given B12 injection today

## 2019-03-21 NOTE — Assessment & Plan Note (Signed)
Continue lexapro, add buspar 7.5mg  BID, can continue atarax prn She is willing to do therapy but it is currently on hold due to COVID-19

## 2019-03-21 NOTE — Progress Notes (Signed)
Subjective:    Patient ID: Sabrina Roberts, female    DOB: 08-15-1971, 48 y.o.   MRN: [id number]  Patient presents for Back Pain (intermittent back pain in lower back in the middle- states that this is a new type of pain in a new position )   Increasing anxiety and stress at home. She has contemplating leaving husband many times, they have seperated a few times where she left to stay at her fathers home. There youngest daughter is defiant and wont listen, eldest does not work or go to school but does not live in the house. At one point she was so low she contemplated SI, this was a few months ago, she did reach out to her EAP at that time She is taking lexapro and this helps but still has breakthrough anxiety, she takes atarax at times  B12 was low - due for injection  DM- not on metformin due to GI upset, last A1C 6.6% in November, increasing weight in setting of stressors described above    Chronic back pain but worse in lumbar region, still radiates down legs, surgeon can not see her for a few months,s he is on a cancellation schedule. Taking ibuprofen and tylenol which has not helped  Ultram has not helped her back  Taking lyrica at bedtime  Has flexeril   Has prednisone pack at home   Review Of Systems:  GEN- denies fatigue, fever, weight loss,weakness, recent illness HEENT- denies eye drainage, change in vision, nasal discharge, CVS- denies chest pain, palpitations RESP- denies SOB, cough, wheeze ABD- denies N/V, change in stools, abd pain GU- denies dysuria, hematuria, dribbling, incontinence MSK- + joint pain, muscle aches, injury Neuro- denies headache, dizziness, syncope, seizure activity       Objective:    BP 122/62   Pulse 62   Temp 98.1 F (36.7 C) (Oral)   Resp 14   Ht 5\' 4"  (1.626 m)   Wt 261 lb (118.4 kg)   SpO2 98%   BMI 44.80 kg/m  GEN- NAD, alert and oriented x3 HEENT- PERRL, EOMI, non injected sclera, pink conjunctiva, MMM, oropharynx clear Neck-  Supple, no thyromegaly , good ROM CVS- RRR, no murmur RESP-CTAB ABD-NABS,soft,NT,ND Psych- normal affect and mood  MSK-TTP lower thoracic and upper lumbar spine, neg SLR, fair ROM EXT- No edema Pulses- Radial  2+        Assessment & Plan:      Problem List Items Addressed This Visit      Unprioritized   Abnormal TSH   Relevant Orders   T3, free   TSH   T4, free   B12 deficiency    Low B12 level in setting of gastric bypass, given B12 injection today       Relevant Medications   cyanocobalamin ((VITAMIN B-12)) injection 1,000 mcg (Completed)   Bulging of lumbar intervertebral disc    F/u with her orthopedic surgeon Can start the medrol dose pak, given norco       Chronic back pain   Relevant Medications   HYDROcodone-acetaminophen (NORCO) 5-325 MG tablet   Depression with anxiety - Primary    Continue lexapro, add buspar 7.5mg  BID, can continue atarax prn She is willing to do therapy but it is currently on hold due to COVID-19      Relevant Medications   busPIRone (BUSPAR) 7.5 MG tablet   DM (diabetes mellitus) ([facility])    Check A1C today, goal less than 7% Discussed dietary changes, she has  history of gastric bypass Did not tolerate metformin Consider GLP1 therapy      Relevant Orders   CBC with Differential/Platelet   Comprehensive metabolic panel   Hemoglobin A1c   Morbid obesity ([place])   Vitamin D deficiency    Weekly vitamin D       Relevant Orders   Vitamin D, 25-hydroxy      Note: This dictation was prepared with Dragon dictation along with smaller phrase technology. Any transcriptional errors that result from this process are unintentional.

## 2019-03-21 NOTE — Assessment & Plan Note (Signed)
Weekly vitamin D

## 2019-03-22 LAB — CBC WITH DIFFERENTIAL/PLATELET
Absolute Monocytes: 731 cells/uL (ref 200–950)
Basophils Absolute: 34 cells/uL (ref 0–200)
Basophils Relative: 0.4 %
Eosinophils Absolute: 160 cells/uL (ref 15–500)
Eosinophils Relative: 1.9 %
HCT: 30.5 % — ABNORMAL LOW (ref 35.0–45.0)
Hemoglobin: 9.7 g/dL — ABNORMAL LOW (ref 11.7–15.5)
Lymphs Abs: 2386 cells/uL (ref 850–3900)
MCH: 27.8 pg (ref 27.0–33.0)
MCHC: 31.8 g/dL — ABNORMAL LOW (ref 32.0–36.0)
MCV: 87.4 fL (ref 80.0–100.0)
MPV: 11.9 fL (ref 7.5–12.5)
Monocytes Relative: 8.7 %
Neutro Abs: 5090 cells/uL (ref 1500–7800)
Neutrophils Relative %: 60.6 %
Platelets: 325 10*3/uL (ref 140–400)
RBC: 3.49 10*6/uL — ABNORMAL LOW (ref 3.80–5.10)
RDW: 13.9 % (ref 11.0–15.0)
Total Lymphocyte: 28.4 %
WBC: 8.4 10*3/uL (ref 3.8–10.8)

## 2019-03-22 LAB — COMPREHENSIVE METABOLIC PANEL
AG Ratio: 1.4 (calc) (ref 1.0–2.5)
ALT: 12 U/L (ref 6–29)
AST: 15 U/L (ref 10–35)
Albumin: 4 g/dL (ref 3.6–5.1)
Alkaline phosphatase (APISO): 101 U/L (ref 31–125)
BUN: 13 mg/dL (ref 7–25)
CO2: 22 mmol/L (ref 20–32)
Calcium: 8.9 mg/dL (ref 8.6–10.2)
Chloride: 110 mmol/L (ref 98–110)
Creat: 0.68 mg/dL (ref 0.50–1.10)
Globulin: 2.8 g/dL (calc) (ref 1.9–3.7)
Glucose, Bld: 108 mg/dL — ABNORMAL HIGH (ref 65–99)
Potassium: 4.5 mmol/L (ref 3.5–5.3)
Sodium: 142 mmol/L (ref 135–146)
Total Bilirubin: 0.4 mg/dL (ref 0.2–1.2)
Total Protein: 6.8 g/dL (ref 6.1–8.1)

## 2019-03-22 LAB — HEMOGLOBIN A1C
Hgb A1c MFr Bld: 6.9 % of total Hgb — ABNORMAL HIGH (ref <5.7)
Mean Plasma Glucose: 151 (calc)
eAG (mmol/L): 8.4 (calc)

## 2019-03-22 LAB — T3, FREE: T3, Free: 2.4 pg/mL (ref 2.3–4.2)

## 2019-03-22 LAB — VITAMIN D 25 HYDROXY (VIT D DEFICIENCY, FRACTURES): Vit D, 25-Hydroxy: 9 ng/mL — ABNORMAL LOW (ref 30–100)

## 2019-03-22 LAB — T4, FREE: Free T4: 1 ng/dL (ref 0.8–1.8)

## 2019-03-22 LAB — TSH: TSH: 0.33 mIU/L — ABNORMAL LOW

## 2019-03-23 ENCOUNTER — Other Ambulatory Visit: Payer: Self-pay | Admitting: *Deleted

## 2019-03-23 DIAGNOSIS — E538 Deficiency of other specified B group vitamins: Secondary | ICD-10-CM

## 2019-03-23 DIAGNOSIS — R7989 Other specified abnormal findings of blood chemistry: Secondary | ICD-10-CM

## 2019-03-23 DIAGNOSIS — E119 Type 2 diabetes mellitus without complications: Secondary | ICD-10-CM

## 2019-03-23 DIAGNOSIS — E0590 Thyrotoxicosis, unspecified without thyrotoxic crisis or storm: Secondary | ICD-10-CM

## 2019-03-23 DIAGNOSIS — E559 Vitamin D deficiency, unspecified: Secondary | ICD-10-CM

## 2019-03-23 DIAGNOSIS — D509 Iron deficiency anemia, unspecified: Secondary | ICD-10-CM

## 2019-03-23 DIAGNOSIS — Z9884 Bariatric surgery status: Secondary | ICD-10-CM

## 2019-03-23 MED ORDER — FERROUS SULFATE 325 (65 FE) MG PO TABS: 325.0000 mg | ORAL_TABLET | Freq: Two times a day (BID) | ORAL | 11 refills | Status: DC

## 2019-03-23 MED ORDER — VITAMIN D (ERGOCALCIFEROL) 1.25 MG (50000 UNIT) PO CAPS: 50000.0000 [IU] | ORAL_CAPSULE | ORAL | 0 refills | Status: DC

## 2019-03-23 MED ORDER — DULAGLUTIDE 0.75 MG/0.5ML ~~LOC~~ SOAJ: 0.7500 mg | SUBCUTANEOUS | 11 refills | Status: DC

## 2019-04-10 ENCOUNTER — Encounter: Payer: Self-pay | Admitting: Family Medicine

## 2019-04-12 MED ORDER — BUSPIRONE HCL 10 MG PO TABS: 10.0000 mg | ORAL_TABLET | Freq: Two times a day (BID) | ORAL | 3 refills | Status: DC

## 2019-04-12 NOTE — Telephone Encounter (Signed)
Received call from patient.   States that she does not have shortness of breath as much as she has intermittent episode where she feels like she can't catch her breath. Reports that it has woken her up in the middle of the night.  States that she does feel that the Buspar is helping, but she just had a few bad days.   Denies any other Sx.  MD made aware.Per MD, increase Buspar to 10mg  PO BID. Schedule Televisit for next week.   Prescription sent to pharmacy.   Call placed to patient and patient made aware.   Appointment scheduled.

## 2019-04-19 ENCOUNTER — Encounter: Payer: Self-pay | Admitting: Family Medicine

## 2019-04-19 ENCOUNTER — Telehealth: Payer: Self-pay | Admitting: *Deleted

## 2019-04-19 ENCOUNTER — Ambulatory Visit (INDEPENDENT_AMBULATORY_CARE_PROVIDER_SITE_OTHER): Payer: 59 | Admitting: Family Medicine

## 2019-04-19 ENCOUNTER — Other Ambulatory Visit: Payer: Self-pay

## 2019-04-19 DIAGNOSIS — E119 Type 2 diabetes mellitus without complications: Secondary | ICD-10-CM

## 2019-04-19 DIAGNOSIS — F418 Other specified anxiety disorders: Secondary | ICD-10-CM

## 2019-04-19 MED ORDER — HYDROCODONE-ACETAMINOPHEN 5-325 MG PO TABS: 1.0000 | ORAL_TABLET | Freq: Four times a day (QID) | ORAL | 0 refills | Status: DC | PRN

## 2019-04-19 MED ORDER — BLOOD GLUCOSE TEST VI STRP: ORAL_STRIP | 3 refills | Status: DC

## 2019-04-19 MED ORDER — ESCITALOPRAM OXALATE 20 MG PO TABS: 20.0000 mg | ORAL_TABLET | Freq: Every day | ORAL | 1 refills | Status: DC

## 2019-04-19 MED ORDER — LANCET DEVICES MISC: 1 refills | Status: DC

## 2019-04-19 MED ORDER — FERROUS SULFATE 325 (65 FE) MG PO TABS: 325.0000 mg | ORAL_TABLET | Freq: Two times a day (BID) | ORAL | 3 refills | Status: DC

## 2019-04-19 MED ORDER — DULAGLUTIDE 0.75 MG/0.5ML ~~LOC~~ SOAJ: 0.7500 mg | SUBCUTANEOUS | 11 refills | Status: DC

## 2019-04-19 MED ORDER — VITAMIN D (ERGOCALCIFEROL) 1.25 MG (50000 UNIT) PO CAPS: 50000.0000 [IU] | ORAL_CAPSULE | ORAL | 0 refills | Status: DC

## 2019-04-19 MED ORDER — BUSPIRONE HCL 10 MG PO TABS: 10.0000 mg | ORAL_TABLET | Freq: Two times a day (BID) | ORAL | 3 refills | Status: DC

## 2019-04-19 MED ORDER — BLOOD GLUCOSE SYSTEM PAK KIT: PACK | 1 refills | Status: DC

## 2019-04-19 MED FILL — VIT D2 1.25 MG (50,000 UNIT: 1.25 MG | 84 days supply | Qty: 12 | Fill #0

## 2019-04-19 MED FILL — ESCITALOPRAM 20 MG TABLET: 20 | 90 days supply | Qty: 90 | Fill #0

## 2019-04-19 MED FILL — busPIRone HCL 10 MG TABS: 10 | 30 days supply | Qty: 60 | Fill #0

## 2019-04-19 NOTE — Telephone Encounter (Signed)
-----   Message from Alycia Rossetti, MD sent at 04/19/2019 12:34 PM EDT ----- Regarding: Medication changes   Patient would like to change her pharmacy to [facility] order  I think she can get her trulicity cheaper that way  I told her to call and set up account  She also needs new meter and test strips, lancets Test CBG fasting You can send every thing except pain medication

## 2019-04-19 NOTE — Progress Notes (Signed)
Virtual Visit via Telephone Note  I connected with Sabrina Roberts on 04/19/19 at 12:05 PM EDT by telephone and verified that I am speaking with the correct person using two identifiers.   Pt location: at home   Physician location: at home, Vic Blackbird MD   On call- pt and physician   I discussed the limitations, risks, security and privacy concerns of performing an evaluation and management service by telephone and the availability of in person appointments. I also discussed with the patient that there may be a patient responsible charge related to this service. The patient expressed understanding and agreed to proceed.   History of Present Illness: F/U on anxiety and depression. Taking lexapro, she called in a week or so ago, having increased anxiety attacks, where she felt she couldn't breathe. The newly added buspar was increased to 10mg  BID on 4/13. Since then she has only had 2 mild attacks, 1 while at work, was very overwhelmed with all the patients coming in. The second was at home getting ready for bed. But both times she just breathed through and felt better a few minutes later She can now tell a difference with the buspar on hand. Sleep is good, does not feel on edge. She has not set up any therapy yet    DM- now on trulcity weekly, her weight is down to 249lbs , with a lost of 13 pounds in the past month. No hypoglycemia symptoms  Needs a new meter and test strips sent in    Observations/Objective:  Telephone, no acute distress, happy sounding. Normal speech, answers questions appopriately   Assessment and Plan:   MDD/GAD- continue lexapro and buspar at 10mg  twice a day, discussed her work has telehealth available, she will look into this as unable to get to her couples counselor at this time  continue vistaril prn breakthrough anxiety   DM- doing well on trulicty no SE, will have her check CBG fasting   new meter and meds sent to Mail order pharmacy   Requested refill on  pain meds  Follow Up Instructions:    I discussed the assessment and treatment plan with the patient. The patient was provided an opportunity to ask questions and all were answered. The patient agreed with the plan and demonstrated an understanding of the instructions.   The patient was advised to call back or seek an in-person evaluation if the symptoms worsen or if the condition fails to improve as anticipated.  I provided 25minutes of non-face-to-face time during this encounter. End time:12:17pm  Vic Blackbird, MD

## 2019-04-19 NOTE — Telephone Encounter (Signed)
Prescriptions sent to pharmacy

## 2019-04-20 ENCOUNTER — Telehealth: Payer: Self-pay | Admitting: *Deleted

## 2019-04-20 ENCOUNTER — Encounter: Payer: Self-pay | Admitting: Family Medicine

## 2019-04-20 NOTE — Telephone Encounter (Signed)
Received request from pharmacy for PA on Trulicity.  PA submitted.   Dx: E11.9- DM.   Patient is not able to tolerate MTF.

## 2019-04-20 NOTE — Telephone Encounter (Signed)
[employer] is reviewing your PA request. You may close this dialog, return to your dashboard, and perform other tasks.  To check for an update later, open this request again from your dashboard. If [employer] has not replied within 24 hours for urgent requests or within 48 hours for standard requests, please contact [employer] at [phone].

## 2019-04-21 MED ORDER — BLOOD GLUCOSE SYSTEM PAK KIT: PACK | 1 refills | Status: DC

## 2019-04-21 MED ORDER — BLOOD GLUCOSE TEST VI STRP: ORAL_STRIP | 3 refills | Status: DC

## 2019-04-21 MED FILL — ONETOUCH ULTRA 2 W/DEVICE K: W/DEVICE | 30 days supply | Qty: 1 | Fill #0

## 2019-04-21 MED FILL — ONE TOUCH ULTRA TEST STRIPS: 90 days supply | Qty: 100 | Fill #0

## 2019-04-22 MED FILL — TRULICITY 0.75 MG/0.5 ML PE: 0.75 | 28 days supply | Qty: 2 | Fill #0

## 2019-04-24 ENCOUNTER — Other Ambulatory Visit: Payer: Self-pay | Admitting: *Deleted

## 2019-04-24 MED ORDER — ZOLPIDEM TARTRATE 10 MG PO TABS: 10.0000 mg | ORAL_TABLET | Freq: Every evening | ORAL | 3 refills | Status: DC | PRN

## 2019-04-24 MED ORDER — DULAGLUTIDE 0.75 MG/0.5ML ~~LOC~~ SOAJ: 0.7500 mg | SUBCUTANEOUS | 11 refills | Status: DC

## 2019-04-24 MED FILL — ZOLPIDEM TARTRATE 10 MG TAB: 10 | 30 days supply | Qty: 30 | Fill #0

## 2019-04-24 NOTE — Telephone Encounter (Signed)
Received call from patient.   Requested refill on Ambien.   Ok to refill??  Last office visit 04/19/2019.  Last refill 12/23/2018, #3 refills.

## 2019-04-24 NOTE — Telephone Encounter (Signed)
Received PA determination.   PA [id number] approved 04/19/2019- 04/17/2020.  Pharmacy made aware.

## 2019-04-28 ENCOUNTER — Ambulatory Visit (INDEPENDENT_AMBULATORY_CARE_PROVIDER_SITE_OTHER): Payer: 59 | Admitting: Endocrinology

## 2019-04-28 ENCOUNTER — Other Ambulatory Visit: Payer: Self-pay

## 2019-04-28 DIAGNOSIS — E119 Type 2 diabetes mellitus without complications: Secondary | ICD-10-CM

## 2019-04-28 MED ORDER — DULAGLUTIDE 1.5 MG/0.5ML ~~LOC~~ SOAJ: 1.5000 mg | SUBCUTANEOUS | 3 refills | Status: DC

## 2019-04-28 NOTE — Progress Notes (Signed)
Subjective:    Patient ID: Sabrina Roberts, female    DOB: 04-Sep-1971, 48 y.o.   MRN: [id number]  HPI telehealth visit today via doxy video visit.  Alternatives to telehealth are presented to this patient, and the patient agrees to the telehealth visit. Pt is advised of the cost of the visit, and agrees to this, also.   Patient is in her car, and I am at the office.   Persons attending the telehealth visit: the patient and I pt is referred by Dr Buelah Manis, for diabetes.  Pt states DM was dx'ed in 1998, during a pregnancy; she did not require medication until 2004; she has mild if any neuropathy of the lower extremities; she is unaware of any associated chronic complications; she took insulin 2009-2011; pt says her diet and exercise are fair; he has never had pancreatitis, pancreatic surgery, severe hypoglycemia or DKA.  She did not tolerate metformin (nausea).  She takes trulicity and colestid.  cbg varies from 80-240.  She just finished a course of steroids.   Past Medical History:  Diagnosis Date  . Anemia   . Anxiety   . Arthritis   . Depression   . Diabetes mellitus   . Hyperlipidemia   . Hypertension   . Obesity   . Sleep apnea    Has CPAP but has lost weight and hasnt used    Past Surgical History:  Procedure Laterality Date  . CARPAL TUNNEL RELEASE Right 10/01/2016   Procedure: RIGHT CARPAL TUNNEL RELEASE;  Surgeon: Carole Civil, MD;  Location: [facility];  Service: Orthopedics;  Laterality: Right;  . CARPAL TUNNEL RELEASE Left 10/22/2016   Procedure: CARPAL TUNNEL RELEASE;  Surgeon: Carole Civil, MD;  Location: [facility];  Service: Orthopedics;  Laterality: Left;  . CESAREAN SECTION  2002  . CHOLECYSTECTOMY  dec 2012  . GASTRIC BYPASS  dec 2012  . KNEE ARTHROSCOPY Right     Social History   Socioeconomic History  . Marital status: Married    Spouse name: Not on file  . Number of children: Not on file  . Years of education: Not on file  . Highest education level:  Not on file  Occupational History  . Not on file  Social Needs  . Financial resource strain: Not on file  . Food insecurity:    Worry: Not on file    Inability: Not on file  . Transportation needs:    Medical: Not on file    Non-medical: Not on file  Tobacco Use  . Smoking status: Never Smoker  . Smokeless tobacco: Never Used  Substance and Sexual Activity  . Alcohol use: Yes    Alcohol/week: 0.0 standard drinks    Comment: 2 drinks per month  . Drug use: No  . Sexual activity: Yes    Partners: Male    Birth control/protection: None  Lifestyle  . Physical activity:    Days per week: Not on file    Minutes per session: Not on file  . Stress: Not on file  Relationships  . Social connections:    Talks on phone: Not on file    Gets together: Not on file    Attends religious service: Not on file    Active member of club or organization: Not on file    Attends meetings of clubs or organizations: Not on file    Relationship status: Not on file  . Intimate partner violence:    Fear of current or ex  partner: Not on file    Emotionally abused: Not on file    Physically abused: Not on file    Forced sexual activity: Not on file  Other Topics Concern  . Not on file  Social History Narrative  . Not on file    Current Outpatient Medications on File Prior to Visit  Medication Sig Dispense Refill  . Blood Glucose Monitoring Suppl (BLOOD GLUCOSE SYSTEM PAK) KIT Please dispense as One Touch. Use as directed to monitor FSBS 1x daily. Dx: E11.9. 1 each 1  . busPIRone (BUSPAR) 10 MG tablet Take 1 tablet (10 mg total) by mouth 2 (two) times daily. For anxiety 60 tablet 3  . colestipol (COLESTID) 1 g tablet Take 1 tablet (1 g total) by mouth 2 (two) times daily. 60 tablet 1  . cyclobenzaprine (FLEXERIL) 10 MG tablet Take 1 tablet (10 mg total) by mouth 2 (two) times daily as needed for muscle spasms. 20 tablet 0  . escitalopram (LEXAPRO) 20 MG tablet Take 1 tablet (20 mg total) by mouth  daily. 90 tablet 1  . ferrous sulfate 325 (65 FE) MG tablet Take 1 tablet (325 mg total) by mouth 2 (two) times daily with a meal. 180 tablet 3  . Glucose Blood (BLOOD GLUCOSE TEST STRIPS) STRP Please dispense as One Touch. Use as directed to monitor FSBS 1x daily. Dx: E11.9. 100 each 3  . HYDROcodone-acetaminophen (NORCO) 5-325 MG tablet Take 1 tablet by mouth every 6 (six) hours as needed for moderate pain. 30 tablet 0  . hydrOXYzine (VISTARIL) 25 MG capsule TAKE 1 CAPSULE BY MOUTH THREE TIMES DAILY AS NEEDED FOR ANXIETY 30 capsule 0  . Lancet Devices MISC Please dispense based on patient and insurance preference. Use as directed to monitor FSBS 1x daily. Dx: E11.9. 1 each 1  . Multiple Vitamin (MULTIVITAMIN) tablet Take 1 tablet by mouth daily.     . pregabalin (LYRICA) 150 MG capsule Take 1 capsule (150 mg total) by mouth at bedtime. 90 capsule 0  . Vitamin D, Ergocalciferol, (DRISDOL) 1.25 MG (50000 UT) CAPS capsule Take 1 capsule (50,000 Units total) by mouth every 7 (seven) days. x6 months 12 capsule 0  . zolpidem (AMBIEN) 10 MG tablet Take 1 tablet (10 mg total) by mouth at bedtime as needed. 30 tablet 3   No current facility-administered medications on file prior to visit.     Allergies  Allergen Reactions  . Actos [Pioglitazone Hydrochloride] Anaphylaxis, Hives and Other (See Comments)    Chest pain   . Latex Rash    Family History  Problem Relation Age of Onset  . Hypertension Mother   . Depression Mother   . Diabetes Mother   . Irritable bowel syndrome Mother   . Heart disease Mother   . Stroke Mother   . Hypertension Father   . Hyperlipidemia Father   . Prostate cancer Father   . Colon polyps Father   . Heart disease Father   . Kidney Stones Father   . Diverticulitis Father   . Colon cancer Father   . Breast cancer Paternal Grandmother   . Colon cancer Paternal Grandfather   . Diabetes Maternal Grandmother   . Breast cancer Maternal Aunt   . Stomach cancer  Paternal Uncle   . Colon cancer Other        Paternal Great Uncles x 4  . Colon polyps Paternal Aunt        x 3    Review of Systems She  has gained 50 lbs.  Back pain persists.  She has chronic anxiety, rhinorrhea, difficulty with concentration, leg cramps, easy bruising, and cold intolerance.  denies blurry vision, headache, sob, n/v, urinary frequency, excessive diaphoresis.  She has chronic intermitt chest pain.    Objective:   Physical Exam    Lab Results  Component Value Date   HGBA1C 6.9 (H) 03/21/2019   Lab Results  Component Value Date   CREATININE 0.68 03/21/2019   BUN 13 03/21/2019   NA 142 03/21/2019   K 4.5 03/21/2019   CL 110 03/21/2019   CO2 22 03/21/2019   I have reviewed outside records, and summarized: Pt was noted to have elevated a1c, and referred here.  Anxiety was also addressed, and was much better with rx.      Assessment & Plan:  Chest pain, chronic, uncertain etiology Weight gain: increasing trulicity will help Nausea: better, but This limits rx options Back pain: more aggressive rx for DM with lessen excursion with steroids.   Patient Instructions  Please call Dr Buelah Manis, to report the chest pain. good diet and exercise significantly improve the control of your diabetes.  please let me know if you wish to be referred to a dietician.  high blood sugar is very risky to your health.  you should see an eye doctor and dentist every year.  It is very important to get all recommended vaccinations.  Controlling your blood pressure and cholesterol drastically reduces the damage diabetes does to your body.  Those who smoke should quit.  Please discuss these with your doctor.  check your blood sugar once a day.  vary the time of day when you check, between before the 3 meals, and at bedtime.  also check if you have symptoms of your blood sugar being too high or too low.  please keep a record of the readings and bring it to your next appointment here (or you can  bring the meter itself).  You can write it on any piece of paper.  please call us sooner if your blood sugar goes below 70, or if you have readings over 200. I have sent a prescription to your pharmacy, to increase the Trulicity.   Please come back for a follow-up appointment in 2 months.

## 2019-04-28 NOTE — Patient Instructions (Addendum)
Please call Dr Buelah Manis, to report the chest pain. good diet and exercise significantly improve the control of your diabetes.  please let me know if you wish to be referred to a dietician.  high blood sugar is very risky to your health.  you should see an eye doctor and dentist every year.  It is very important to get all recommended vaccinations.  Controlling your blood pressure and cholesterol drastically reduces the damage diabetes does to your body.  Those who smoke should quit.  Please discuss these with your doctor.  check your blood sugar once a day.  vary the time of day when you check, between before the 3 meals, and at bedtime.  also check if you have symptoms of your blood sugar being too high or too low.  please keep a record of the readings and bring it to your next appointment here (or you can bring the meter itself).  You can write it on any piece of paper.  please call us sooner if your blood sugar goes below 70, or if you have readings over 200. I have sent a prescription to your pharmacy, to increase the Trulicity.   Please come back for a follow-up appointment in 2 months.

## 2019-05-24 ENCOUNTER — Other Ambulatory Visit: Payer: Self-pay | Admitting: Family Medicine

## 2019-05-24 ENCOUNTER — Other Ambulatory Visit: Payer: Self-pay

## 2019-05-24 DIAGNOSIS — G2581 Restless legs syndrome: Secondary | ICD-10-CM

## 2019-05-24 MED ORDER — HYDROCODONE-ACETAMINOPHEN 5-325 MG PO TABS: 1.0000 | ORAL_TABLET | Freq: Four times a day (QID) | ORAL | 0 refills | Status: DC | PRN

## 2019-05-24 NOTE — Telephone Encounter (Signed)
Requested Prescriptions   Pending Prescriptions Disp Refills  . HYDROcodone-acetaminophen (NORCO) 5-325 MG tablet 30 tablet 0    Sig: Take 1 tablet by mouth every 6 (six) hours as needed for moderate pain.   Last OV 04/19/2019 Last written 04/19/2019

## 2019-05-25 ENCOUNTER — Other Ambulatory Visit: Payer: Self-pay

## 2019-05-25 ENCOUNTER — Telehealth: Payer: Self-pay

## 2019-05-25 DIAGNOSIS — G2581 Restless legs syndrome: Secondary | ICD-10-CM

## 2019-05-25 MED ORDER — PREGABALIN 150 MG PO CAPS: 150.0000 mg | ORAL_CAPSULE | Freq: Every day | ORAL | 0 refills | Status: DC

## 2019-05-25 NOTE — Telephone Encounter (Signed)
Pt called and stated that she has a yeast infection and wanted to know if you could send an Rx to her pharmacy? Please advise.

## 2019-05-26 ENCOUNTER — Other Ambulatory Visit: Payer: Self-pay | Admitting: Family Medicine

## 2019-05-26 MED ORDER — FLUCONAZOLE 150 MG PO TABS: 150.0000 mg | ORAL_TABLET | Freq: Every day | ORAL | 0 refills | Status: DC

## 2019-05-26 MED ORDER — FERROUS SULFATE 325 (65 FE) MG PO TABS: 325.0000 mg | ORAL_TABLET | Freq: Two times a day (BID) | ORAL | 3 refills | Status: DC

## 2019-05-26 NOTE — Telephone Encounter (Signed)
Diflucan 150mg  x 1 , repeat in 3 days , dispense  2 R 0  If this does not clear up, schedule OV

## 2019-05-26 NOTE — Telephone Encounter (Signed)
Requested Prescriptions   Pending Prescriptions Disp Refills  . zolpidem (AMBIEN) 10 MG tablet [Pharmacy Med Name: Zolpidem Tartrate 10 MG Oral Tablet] 30 tablet 0    Sig: TAKE 1 TABLET BY MOUTH AT BEDTIME AS NEEDED   Signed Prescriptions Disp Refills  . ferrous sulfate 325 (65 FE) MG tablet 180 tablet 3    Sig: Take 1 tablet (325 mg total) by mouth 2 (two) times daily with a meal.    Authorizing Provider: Alycia Rossetti    Ordering User: Vanice Sarah  . fluconazole (DIFLUCAN) 150 MG tablet 2 tablet 0    Sig: Take 1 tablet (150 mg total) by mouth daily. Take 1 tab (150 mg total) today another tab in 3 days.    Authorizing Provider: Alycia Rossetti    Ordering User: Vanice Sarah   Last OV 04/19/2019 Last written

## 2019-05-26 NOTE — Telephone Encounter (Signed)
Rx sent to pharmacy and pt notified  ?

## 2019-05-30 ENCOUNTER — Encounter: Payer: Self-pay | Admitting: Family Medicine

## 2019-05-30 DIAGNOSIS — G2581 Restless legs syndrome: Secondary | ICD-10-CM

## 2019-05-30 MED ORDER — PREGABALIN 150 MG PO CAPS: 150.0000 mg | ORAL_CAPSULE | Freq: Every day | ORAL | 0 refills | Status: DC

## 2019-05-30 MED ORDER — ZOLPIDEM TARTRATE 10 MG PO TABS: 10.0000 mg | ORAL_TABLET | Freq: Every evening | ORAL | 3 refills | Status: DC | PRN

## 2019-05-30 NOTE — Telephone Encounter (Signed)
Orders are pending.

## 2019-07-14 ENCOUNTER — Encounter: Payer: Self-pay | Admitting: Family Medicine

## 2019-07-14 MED ORDER — HYDROCODONE-ACETAMINOPHEN 5-325 MG PO TABS: 1.0000 | ORAL_TABLET | Freq: Four times a day (QID) | ORAL | 0 refills | Status: DC | PRN

## 2019-07-14 NOTE — Telephone Encounter (Signed)
Last filled on 05/24/2019

## 2019-08-08 ENCOUNTER — Emergency Department (HOSPITAL_COMMUNITY)
Admission: EM | Admit: 2019-08-08 | Discharge: 2019-08-08 | Disposition: A | Discharge status: Home / Self Care | Payer: 59 | Attending: Emergency Medicine | Admitting: Emergency Medicine

## 2019-08-08 ENCOUNTER — Emergency Department (HOSPITAL_COMMUNITY): Payer: 59

## 2019-08-08 ENCOUNTER — Other Ambulatory Visit: Payer: Self-pay

## 2019-08-08 ENCOUNTER — Encounter (HOSPITAL_COMMUNITY): Payer: Self-pay | Admitting: Emergency Medicine

## 2019-08-08 DIAGNOSIS — S80211A Abrasion, right knee, initial encounter: Secondary | ICD-10-CM | POA: Insufficient documentation

## 2019-08-08 DIAGNOSIS — E119 Type 2 diabetes mellitus without complications: Secondary | ICD-10-CM | POA: Diagnosis not present

## 2019-08-08 DIAGNOSIS — M25562 Pain in left knee: Secondary | ICD-10-CM | POA: Diagnosis not present

## 2019-08-08 DIAGNOSIS — W19XXXA Unspecified fall, initial encounter: Secondary | ICD-10-CM

## 2019-08-08 DIAGNOSIS — M25561 Pain in right knee: Secondary | ICD-10-CM | POA: Insufficient documentation

## 2019-08-08 DIAGNOSIS — M79642 Pain in left hand: Secondary | ICD-10-CM | POA: Insufficient documentation

## 2019-08-08 DIAGNOSIS — Y999 Unspecified external cause status: Secondary | ICD-10-CM | POA: Diagnosis not present

## 2019-08-08 DIAGNOSIS — Z91040 Latex allergy status: Secondary | ICD-10-CM | POA: Insufficient documentation

## 2019-08-08 DIAGNOSIS — Y929 Unspecified place or not applicable: Secondary | ICD-10-CM | POA: Insufficient documentation

## 2019-08-08 DIAGNOSIS — M79645 Pain in left finger(s): Secondary | ICD-10-CM | POA: Diagnosis not present

## 2019-08-08 DIAGNOSIS — Y9301 Activity, walking, marching and hiking: Secondary | ICD-10-CM | POA: Insufficient documentation

## 2019-08-08 DIAGNOSIS — S299XXA Unspecified injury of thorax, initial encounter: Secondary | ICD-10-CM | POA: Diagnosis not present

## 2019-08-08 DIAGNOSIS — W101XXA Fall (on)(from) sidewalk curb, initial encounter: Secondary | ICD-10-CM | POA: Insufficient documentation

## 2019-08-08 DIAGNOSIS — Z79899 Other long term (current) drug therapy: Secondary | ICD-10-CM | POA: Diagnosis not present

## 2019-08-08 DIAGNOSIS — S8992XA Unspecified injury of left lower leg, initial encounter: Secondary | ICD-10-CM | POA: Diagnosis not present

## 2019-08-08 DIAGNOSIS — I10 Essential (primary) hypertension: Secondary | ICD-10-CM | POA: Insufficient documentation

## 2019-08-08 DIAGNOSIS — S8991XA Unspecified injury of right lower leg, initial encounter: Secondary | ICD-10-CM | POA: Diagnosis present

## 2019-08-08 DIAGNOSIS — R079 Chest pain, unspecified: Secondary | ICD-10-CM | POA: Diagnosis not present

## 2019-08-08 DIAGNOSIS — S6992XA Unspecified injury of left wrist, hand and finger(s), initial encounter: Secondary | ICD-10-CM | POA: Diagnosis not present

## 2019-08-08 LAB — CBC WITH DIFFERENTIAL/PLATELET
Abs Immature Granulocytes: 0.02 10*3/uL (ref 0.00–0.07)
Basophils Absolute: 0 10*3/uL (ref 0.0–0.1)
Basophils Relative: 1 %
Eosinophils Absolute: 0.2 10*3/uL (ref 0.0–0.5)
Eosinophils Relative: 2 %
HCT: 38.6 % (ref 36.0–46.0)
Hemoglobin: 12.2 g/dL (ref 12.0–15.0)
Immature Granulocytes: 0 %
Lymphocytes Relative: 38 %
Lymphs Abs: 3 10*3/uL (ref 0.7–4.0)
MCH: 29 pg (ref 26.0–34.0)
MCHC: 31.6 g/dL (ref 30.0–36.0)
MCV: 91.7 fL (ref 80.0–100.0)
Monocytes Absolute: 0.6 10*3/uL (ref 0.1–1.0)
Monocytes Relative: 8 %
Neutro Abs: 4.1 10*3/uL (ref 1.7–7.7)
Neutrophils Relative %: 51 %
Platelets: 343 10*3/uL (ref 150–400)
RBC: 4.21 MIL/uL (ref 3.87–5.11)
RDW: 14.9 % (ref 11.5–15.5)
WBC: 7.9 10*3/uL (ref 4.0–10.5)
nRBC: 0 % (ref 0.0–0.2)

## 2019-08-08 LAB — BASIC METABOLIC PANEL
Anion gap: 10 (ref 5–15)
BUN: 10 mg/dL (ref 6–20)
CO2: 23 mmol/L (ref 22–32)
Calcium: 9 mg/dL (ref 8.9–10.3)
Chloride: 108 mmol/L (ref 98–111)
Creatinine, Ser: 0.74 mg/dL (ref 0.44–1.00)
GFR calc Af Amer: >60 mL/min (ref >60)
GFR calc non Af Amer: >60 mL/min (ref >60)
Glucose, Bld: 101 mg/dL — ABNORMAL HIGH (ref 70–99)
Potassium: 4.3 mmol/L (ref 3.5–5.1)
Sodium: 141 mmol/L (ref 135–145)

## 2019-08-08 LAB — TROPONIN I (HIGH SENSITIVITY): Troponin I (High Sensitivity): <2 ng/L (ref <18)

## 2019-08-08 MED ORDER — ACETAMINOPHEN 325 MG PO TABS
650.0000 mg | ORAL_TABLET | Freq: Once | ORAL | Status: AC
  Administered 2019-08-08: 18:00:00 650 mg via ORAL
  Filled 2019-08-08: qty 2

## 2019-08-08 NOTE — ED Provider Notes (Signed)
[facility] EMERGENCY DEPARTMENT Provider Note   CSN: [id number] Arrival date & time: 08/08/19  1620    History   Chief Complaint Chief Complaint  Patient presents with  . Chest Pain    HPI Sabrina Roberts is a 48 y.o. female.     48yo female with complaint of knee pain after a fall yesterday. Patient reports history of bulging discs in her back, states now having episodes where her right leg goes out and causes her to fall. Patient states she was leaving work yesterday, stepped off the curb and her knee gave out, causing her to fall, resulting in pain in her right knee, also notes pain in her left knee and left thumb. No LOC, denies weakness or dizziness associated with yesterday's fall. Patient has had multiple similar falls, states sometimes feels dizzy or weak prior to falling. Patient also reports left upper chest pressure/sharp pain, radiates to left shoulder, has been having this pain off and on for the past year, previously evaluated in the ER for this and dc, has not followed up with her PCP. No new or different symptoms, last had pain in her chest yesterday, states she would not have come to the ER for the chest pain if not for wanting x-rays after her fall yesterday. No other complaints or concerns.      Past Medical History:  Diagnosis Date  . Anemia   . Anxiety   . Arthritis   . Depression   . Diabetes mellitus   . Hyperlipidemia   . Hypertension   . Obesity   . Sleep apnea    Has CPAP but has lost weight and hasnt used    Patient Active Problem List   Diagnosis Date Noted  . Abnormal TSH 11/15/2018  . Rectal bleeding 11/04/2017  . Diarrhea 11/04/2017  . Thoracic disc herniation 09/06/2017  . Chronic back pain 09/06/2017  . Anemia 09/06/2017  . B12 deficiency 06/08/2017  . Vitamin D deficiency 06/08/2017  . Depression with anxiety 06/08/2017  . Bulging of lumbar intervertebral disc 06/08/2017  . Carpal tunnel syndrome of right wrist   . Borderline  abnormal TFTs 10/29/2014  . Irregular menses 08/01/2013  . Insomnia 10/14/2012  . DM (diabetes mellitus) ([place]) 02/28/2012  . Hyperlipidemia 02/28/2012  . Morbid obesity ([place]) 02/28/2012  . Thyroid nodule 02/28/2012    Past Surgical History:  Procedure Laterality Date  . CARPAL TUNNEL RELEASE Right 10/01/2016   Procedure: RIGHT CARPAL TUNNEL RELEASE;  Surgeon: Carole Civil, MD;  Location: [facility];  Service: Orthopedics;  Laterality: Right;  . CARPAL TUNNEL RELEASE Left 10/22/2016   Procedure: CARPAL TUNNEL RELEASE;  Surgeon: Carole Civil, MD;  Location: [facility];  Service: Orthopedics;  Laterality: Left;  . CESAREAN SECTION  2002  . CHOLECYSTECTOMY  dec 2012  . GASTRIC BYPASS  dec 2012  . KNEE ARTHROSCOPY Right      OB History   No obstetric history on file.      Home Medications    Prior to Admission medications   Medication Sig Start Date End Date Taking? Authorizing Provider  Blood Glucose Monitoring Suppl (BLOOD GLUCOSE SYSTEM PAK) KIT Please dispense as One Touch. Use as directed to monitor FSBS 1x daily. Dx: E11.9. 04/21/19   Alycia Rossetti, MD  busPIRone (BUSPAR) 10 MG tablet Take 1 tablet (10 mg total) by mouth 2 (two) times daily. For anxiety 04/19/19   Alycia Rossetti, MD  colestipol (COLESTID) 1 g tablet Take 1  tablet (1 g total) by mouth 2 (two) times daily. 11/26/17   Armbruster, Carlota Raspberry, MD  cyclobenzaprine (FLEXERIL) 10 MG tablet Take 1 tablet (10 mg total) by mouth 2 (two) times daily as needed for muscle spasms. 07/11/18   Recardo Evangelist, PA-C  Dulaglutide (TRULICITY) 1.5 QR/9.7JO SOPN Inject 1.5 mg into the skin once a week. 04/28/19   Renato Shin, MD  escitalopram (LEXAPRO) 20 MG tablet Take 1 tablet (20 mg total) by mouth daily. 04/19/19   Alycia Rossetti, MD  ferrous sulfate 325 (65 FE) MG tablet Take 1 tablet (325 mg total) by mouth 2 (two) times daily with a meal. 05/26/19   Kensington, Modena Nunnery, MD  fluconazole (DIFLUCAN) 150 MG tablet Take 1  tablet (150 mg total) by mouth daily. Take 1 tab (150 mg total) today another tab in 3 days. 05/26/19   Alycia Rossetti, MD  Glucose Blood (BLOOD GLUCOSE TEST STRIPS) STRP Please dispense as One Touch. Use as directed to monitor FSBS 1x daily. Dx: E11.9. 04/21/19   Alycia Rossetti, MD  HYDROcodone-acetaminophen (NORCO) 5-325 MG tablet Take 1 tablet by mouth every 6 (six) hours as needed for moderate pain. 07/14/19   Alycia Rossetti, MD  hydrOXYzine (VISTARIL) 25 MG capsule TAKE 1 CAPSULE BY MOUTH THREE TIMES DAILY AS NEEDED FOR ANXIETY 01/24/19   Delsa Grana, PA-C  Lancet Devices MISC Please dispense based on patient and insurance preference. Use as directed to monitor FSBS 1x daily. Dx: E11.9. 04/19/19   Alycia Rossetti, MD  Multiple Vitamin (MULTIVITAMIN) tablet Take 1 tablet by mouth daily.     [provider]  pregabalin (LYRICA) 150 MG capsule Take 1 capsule (150 mg total) by mouth daily. 05/30/19   Alycia Rossetti, MD  Vitamin D, Ergocalciferol, (DRISDOL) 1.25 MG (50000 UT) CAPS capsule Take 1 capsule (50,000 Units total) by mouth every 7 (seven) days. x6 months 04/19/19   Alycia Rossetti, MD  zolpidem (AMBIEN) 10 MG tablet Take 1 tablet (10 mg total) by mouth at bedtime as needed. 05/30/19   Alycia Rossetti, MD    Family History Family History  Problem Relation Age of Onset  . Hypertension Mother   . Depression Mother   . Diabetes Mother   . Irritable bowel syndrome Mother   . Heart disease Mother   . Stroke Mother   . Hypertension Father   . Hyperlipidemia Father   . Prostate cancer Father   . Colon polyps Father   . Heart disease Father   . Kidney Stones Father   . Diverticulitis Father   . Colon cancer Father   . Breast cancer Paternal Grandmother   . Colon cancer Paternal Grandfather   . Diabetes Maternal Grandmother   . Breast cancer Maternal Aunt   . Stomach cancer Paternal Uncle   . Colon cancer Other        Paternal Great Uncles x 4  . Colon polyps  Paternal Aunt        x 3    Social History Social History   Tobacco Use  . Smoking status: Never Smoker  . Smokeless tobacco: Never Used  Substance Use Topics  . Alcohol use: Yes    Alcohol/week: 0.0 standard drinks    Comment: occasionally  . Drug use: No     Allergies   Actos [pioglitazone hydrochloride] and Latex   Review of Systems Review of Systems  Constitutional: Negative for fever.  Respiratory: Negative for shortness of  breath.   Cardiovascular: Positive for chest pain.  Musculoskeletal: Positive for arthralgias, gait problem and myalgias. Negative for back pain, joint swelling, neck pain and neck stiffness.  Skin: Positive for wound. Negative for rash.  Allergic/Immunologic: Negative for immunocompromised state.  Hematological: Negative for adenopathy.  Psychiatric/Behavioral: Negative for confusion.  All other systems reviewed and are negative.    Physical Exam Updated Vital Signs BP (!) 96/55   Pulse (!) 59   Temp 97.7 F (36.5 C) (Oral)   Resp 12   Ht _0  (1.626 m)   Wt 111.1 kg   SpO2 100%   BMI 42.05 kg/m   Physical Exam Vitals signs and nursing note reviewed.  Constitutional:      General: She is not in acute distress.    Appearance: She is well-developed. She is obese. She is not diaphoretic.  HENT:     Head: Normocephalic and atraumatic.  Cardiovascular:     Rate and Rhythm: Normal rate and regular rhythm.     Heart sounds: Normal heart sounds.  Pulmonary:     Effort: Pulmonary effort is normal.     Breath sounds: Normal breath sounds.  Chest:     Chest wall: No tenderness.  Abdominal:     Palpations: Abdomen is soft.     Tenderness: There is no abdominal tenderness.  Musculoskeletal:     Left wrist: Normal.     Right knee: She exhibits no swelling, no effusion, no ecchymosis and no deformity. Tenderness found.     Left knee: Tenderness found.     Left hand: She exhibits tenderness. She exhibits normal range of motion.        Hands:     Right lower leg: She exhibits tenderness.     Left lower leg: She exhibits tenderness.       Legs:  Skin:    General: Skin is warm and dry.  Neurological:     Mental Status: She is alert and oriented to person, place, and time.  Psychiatric:        Behavior: Behavior normal.      ED Treatments / Results  Labs (all labs ordered are listed, but only abnormal results are displayed) Labs Reviewed  BASIC METABOLIC PANEL - Abnormal; Notable for the following components:      Result Value   Glucose, Bld 101 (*)    All other components within normal limits  CBC WITH DIFFERENTIAL/PLATELET  TROPONIN I (HIGH SENSITIVITY)    EKG None  Radiology Dg Chest 2 View  Result Date: 08/08/2019 CLINICAL DATA:  48 year old female with history of trauma from a fall on concrete yesterday. EXAM: CHEST - 2 VIEW COMPARISON:  July 11, 2018. FINDINGS: Lung volumes are normal. No consolidative airspace disease. No pleural effusions. No pneumothorax. No pulmonary nodule or mass noted. Pulmonary vasculature and the cardiomediastinal silhouette are within normal limits. IMPRESSION: No radiographic evidence of acute cardiopulmonary disease. Electronically Signed   By: Vinnie Langton M.D.   On: 08/08/2019 17:50   Dg Knee Complete 4 Views Left  Result Date: 08/08/2019 CLINICAL DATA:  48 year old female with history of trauma from a fall yesterday onto concrete complaining of left knee pain. EXAM: LEFT KNEE - COMPLETE 4+ VIEW COMPARISON:  None. FINDINGS: No evidence of fracture, dislocation, or joint effusion. No evidence of arthropathy or other focal bone abnormality. Soft tissues are unremarkable. IMPRESSION: Negative. Electronically Signed   By: Vinnie Langton M.D.   On: 08/08/2019 17:49   Dg Knee Complete  4 Views Right  Result Date: 08/08/2019 CLINICAL DATA:  Fall yesterday with right knee pain, initial encounter EXAM: RIGHT KNEE - COMPLETE 4+ VIEW COMPARISON:  None. FINDINGS: Mild  degenerative changes are noted. No acute fracture or dislocation is seen. No soft tissue abnormality is noted. IMPRESSION: Mild degenerative change without acute abnormality. Electronically Signed   By: Inez Catalina M.D.   On: 08/08/2019 17:49   Dg Finger Thumb Left  Result Date: 08/08/2019 CLINICAL DATA:  Recent fall with left thumb pain, initial encounter EXAM: LEFT THUMB 2+V COMPARISON:  None. FINDINGS: There is no evidence of fracture or dislocation. There is no evidence of arthropathy or other focal bone abnormality. Soft tissues are unremarkable. IMPRESSION: No acute abnormality noted. Electronically Signed   By: Inez Catalina M.D.   On: 08/08/2019 17:50    Procedures Procedures (including critical care time)  Medications Ordered in ED Medications  acetaminophen (TYLENOL) tablet 650 mg (650 mg Oral Given 08/08/19 1801)     Initial Impression / Assessment and Plan / ED Course  I have reviewed the triage vital signs and the nursing notes.  Pertinent labs & imaging results that were available during my care of the patient were reviewed by me and considered in my medical decision making (see chart for details).  Clinical Course as of Aug 07 2052  Tue Aug 08, 2019  2051 48yo female with complaint of injuries from a fall, has minor abrasion to anterior right knee after fall yesterday. Falls due to right leg giving out, history of herniated disc, Xrs unremarkable tonight, recommend follow up with PCP for further work up of falls. Also seen tonight for ongoing left side chest discomfort, last episode yesterday, seen here for same previously with normal work up. Trop x 1 negative, labs reassuring (CBC and BMP), recommend follow up with pcp for further work up, do not suspect ACS.    [LM]    Clinical Course User Index [LM] Tacy Learn, PA-C      Final Clinical Impressions(s) / ED Diagnoses   Final diagnoses:  Fall, initial encounter  Nonspecific chest pain  Abrasion, right knee,  initial encounter  Acute pain of both knees  Left hand pain    ED Discharge Orders    None       Roque Lias 08/08/19 2053    Virgel Manifold, MD 08/10/19 [phone]

## 2019-08-08 NOTE — ED Triage Notes (Signed)
Patient complaining of left sided chest pain and left sided weakness x 2 weeks. States yesterday she fell and is complaining of bilateral knee pain and left thumb pain.

## 2019-08-08 NOTE — Discharge Instructions (Addendum)
Home to rest. Apply ice to sore joints for 20 minutes three times daily. Take Motrin and Tylenol as needed as directed for pain. Follow up with your doctor as discussed for further evaluation of your chest discomfort and falling.

## 2019-09-21 ENCOUNTER — Encounter: Payer: Self-pay | Admitting: Family Medicine

## 2019-09-21 NOTE — Telephone Encounter (Signed)
Ok to refill??  Last office visit 04/19/2019.  Last refill 07/14/2019.

## 2019-09-22 MED ORDER — HYDROCODONE-ACETAMINOPHEN 5-325 MG PO TABS: 1.0000 | ORAL_TABLET | Freq: Four times a day (QID) | ORAL | 0 refills | Status: DC | PRN

## 2019-09-24 ENCOUNTER — Other Ambulatory Visit: Payer: Self-pay | Admitting: Family Medicine

## 2019-09-25 NOTE — Telephone Encounter (Signed)
Last office visit:04/19/2019 Last refilled: 05/30/2019

## 2019-10-24 ENCOUNTER — Encounter: Payer: Self-pay | Admitting: Family Medicine

## 2019-10-24 MED ORDER — HYDROCODONE-ACETAMINOPHEN 5-325 MG PO TABS: 1.0000 | ORAL_TABLET | Freq: Four times a day (QID) | ORAL | 0 refills | Status: DC | PRN

## 2019-10-24 NOTE — Telephone Encounter (Signed)
Ok to refill??  Last office visit 04/19/2019.  Last refill 09/22/2019.

## 2019-11-06 ENCOUNTER — Other Ambulatory Visit: Payer: Self-pay

## 2019-11-06 ENCOUNTER — Ambulatory Visit
Admission: EM | Admit: 2019-11-06 | Discharge: 2019-11-06 | Disposition: A | Discharge status: Home / Self Care | Payer: 59 | Attending: Emergency Medicine | Admitting: Emergency Medicine

## 2019-11-06 DIAGNOSIS — R52 Pain, unspecified: Secondary | ICD-10-CM

## 2019-11-06 DIAGNOSIS — Z20828 Contact with and (suspected) exposure to other viral communicable diseases: Secondary | ICD-10-CM

## 2019-11-06 DIAGNOSIS — Z20822 Contact with and (suspected) exposure to covid-19: Secondary | ICD-10-CM

## 2019-11-06 NOTE — Discharge Instructions (Signed)
COVID testing ordered.  It will take between 5-7 days for test results.  Someone will contact you regarding abnormal results.    In the meantime: You should remain isolated in your home for 10 days from symptom onset AND greater than 72 hours after symptoms resolution (absence of fever without the use of fever-reducing medication and improvement in respiratory symptoms), whichever is longer Get plenty of rest and push fluids Use OTC medications like ibuprofen or tylenol as needed fever or pain Call or go to the ED if you have any new or worsening symptoms such as fever, worsening cough, shortness of breath, chest tightness, chest pain, turning blue, changes in mental status, etc..Marland Kitchen

## 2019-11-06 NOTE — ED Triage Notes (Signed)
Pt is here for covid test required by employer

## 2019-11-06 NOTE — ED Provider Notes (Signed)
[place]   [id number] 11/06/19 Arrival Time: 1252   CC: COVID Test; body aches  SUBJECTIVE: History from: patient.  Sabrina Roberts is a 48 y.o. female who presents with body aches 2 days ago, but speculates she may have slept wrong.  Denies sick exposure to COVID, flu or strep.  Daughter with similar symptoms at home, had negative COVID test.  Works at [employer].  Employer requiring COVID testing.  Has tried OTC aleve with relief.  Denies aggravating factors.  Denies fever, chills, sinus pain, rhinorrhea, sore throat, cough, SOB, wheezing, chest pain, nausea, vomiting, changes in bowel or bladder habits.    ROS: As per HPI.  All other pertinent ROS negative.     Past Medical History:  Diagnosis Date  . Anemia   . Anxiety   . Arthritis   . Depression   . Diabetes mellitus   . Hyperlipidemia   . Hypertension   . Obesity   . Sleep apnea    Has CPAP but has lost weight and hasnt used   Past Surgical History:  Procedure Laterality Date  . CARPAL TUNNEL RELEASE Right 10/01/2016   Procedure: RIGHT CARPAL TUNNEL RELEASE;  Surgeon: Carole Civil, MD;  Location: [facility];  Service: Orthopedics;  Laterality: Right;  . CARPAL TUNNEL RELEASE Left 10/22/2016   Procedure: CARPAL TUNNEL RELEASE;  Surgeon: Carole Civil, MD;  Location: [facility];  Service: Orthopedics;  Laterality: Left;  . CESAREAN SECTION  2002  . CHOLECYSTECTOMY  dec 2012  . GASTRIC BYPASS  dec 2012  . KNEE ARTHROSCOPY Right    Allergies  Allergen Reactions  . Actos [Pioglitazone Hydrochloride] Anaphylaxis, Hives and Other (See Comments)    Chest pain   . Latex Rash   No current facility-administered medications on file prior to encounter.    Current Outpatient Medications on File Prior to Encounter  Medication Sig Dispense Refill  . Blood Glucose Monitoring Suppl (BLOOD GLUCOSE SYSTEM PAK) KIT Please dispense as One Touch. Use as directed to monitor FSBS 1x daily. Dx: E11.9. 1 each 1  .  busPIRone (BUSPAR) 10 MG tablet Take 1 tablet (10 mg total) by mouth 2 (two) times daily. For anxiety 60 tablet 3  . colestipol (COLESTID) 1 g tablet Take 1 tablet (1 g total) by mouth 2 (two) times daily. 60 tablet 1  . cyclobenzaprine (FLEXERIL) 10 MG tablet Take 1 tablet (10 mg total) by mouth 2 (two) times daily as needed for muscle spasms. 20 tablet 0  . Dulaglutide (TRULICITY) 1.5 EN/4.0HW SOPN Inject 1.5 mg into the skin once a week. 12 pen 3  . escitalopram (LEXAPRO) 20 MG tablet Take 1 tablet (20 mg total) by mouth daily. 90 tablet 1  . ferrous sulfate 325 (65 FE) MG tablet Take 1 tablet (325 mg total) by mouth 2 (two) times daily with a meal. 180 tablet 3  . fluconazole (DIFLUCAN) 150 MG tablet Take 1 tablet (150 mg total) by mouth daily. Take 1 tab (150 mg total) today another tab in 3 days. 2 tablet 0  . Glucose Blood (BLOOD GLUCOSE TEST STRIPS) STRP Please dispense as One Touch. Use as directed to monitor FSBS 1x daily. Dx: E11.9. 100 each 3  . HYDROcodone-acetaminophen (NORCO) 5-325 MG tablet Take 1 tablet by mouth every 6 (six) hours as needed for moderate pain. 30 tablet 0  . hydrOXYzine (VISTARIL) 25 MG capsule TAKE 1 CAPSULE BY MOUTH THREE TIMES DAILY AS NEEDED FOR ANXIETY 30 capsule  0  . Lancet Devices MISC Please dispense based on patient and insurance preference. Use as directed to monitor FSBS 1x daily. Dx: E11.9. 1 each 1  . Multiple Vitamin (MULTIVITAMIN) tablet Take 1 tablet by mouth daily.     . pregabalin (LYRICA) 150 MG capsule Take 1 capsule (150 mg total) by mouth daily. 90 capsule 0  . Vitamin D, Ergocalciferol, (DRISDOL) 1.25 MG (50000 UT) CAPS capsule Take 1 capsule (50,000 Units total) by mouth every 7 (seven) days. x6 months 12 capsule 0  . zolpidem (AMBIEN) 10 MG tablet TAKE 1 TABLET BY MOUTH AT BEDTIME AS NEEDED 30 tablet 2   Social History   Socioeconomic History  . Marital status: Married    Spouse name: Not on file  . Number of children: Not on file  .  Years of education: Not on file  . Highest education level: Not on file  Occupational History  . Not on file  Social Needs  . Financial resource strain: Not on file  . Food insecurity    Worry: Not on file    Inability: Not on file  . Transportation needs    Medical: Not on file    Non-medical: Not on file  Tobacco Use  . Smoking status: Never Smoker  . Smokeless tobacco: Never Used  Substance and Sexual Activity  . Alcohol use: Yes    Alcohol/week: 0.0 standard drinks    Comment: occasionally  . Drug use: No  . Sexual activity: Yes    Partners: Male    Birth control/protection: None  Lifestyle  . Physical activity    Days per week: Not on file    Minutes per session: Not on file  . Stress: Not on file  Relationships  . Social Herbalist on phone: Not on file    Gets together: Not on file    Attends religious service: Not on file    Active member of club or organization: Not on file    Attends meetings of clubs or organizations: Not on file    Relationship status: Not on file  . Intimate partner violence    Fear of current or ex partner: Not on file    Emotionally abused: Not on file    Physically abused: Not on file    Forced sexual activity: Not on file  Other Topics Concern  . Not on file  Social History Narrative  . Not on file   Family History  Problem Relation Age of Onset  . Hypertension Mother   . Depression Mother   . Diabetes Mother   . Irritable bowel syndrome Mother   . Heart disease Mother   . Stroke Mother   . Hypertension Father   . Hyperlipidemia Father   . Prostate cancer Father   . Colon polyps Father   . Heart disease Father   . Kidney Stones Father   . Diverticulitis Father   . Colon cancer Father   . Breast cancer Paternal Grandmother   . Colon cancer Paternal Grandfather   . Diabetes Maternal Grandmother   . Breast cancer Maternal Aunt   . Stomach cancer Paternal Uncle   . Colon cancer Other        Paternal Great  Uncles x 4  . Colon polyps Paternal Aunt        x 3    OBJECTIVE:  Vitals:   11/06/19 1302  BP: 124/80  Pulse: 77  Resp: 18  Temp: 98.6 F (  37 C)  SpO2: 98%     General appearance: alert; well-appearing; nontoxic; speaking in full sentences and tolerating own secretions HEENT: NCAT; Ears: EACs clear, TMs pearly gray; Eyes: PERRL.  EOM grossly intact. Nose: nares patent without rhinorrhea, Throat: oropharynx clear, tonsils non erythematous or enlarged, uvula midline  Neck: supple without LAD Lungs: unlabored respirations, symmetrical air entry; cough: absent; no respiratory distress; CTAB Heart: regular rate and rhythm.  Radial pulses 2+ symmetrical bilaterally Skin: warm and dry Psychological: alert and cooperative; normal mood and affect  ASSESSMENT & PLAN:  1. Suspected COVID-19 virus infection   2. Generalized body aches     COVID testing ordered.  It will take between 5-7 days for test results.  Someone will contact you regarding abnormal results.    In the meantime: You should remain isolated in your home for 10 days from symptom onset AND greater than 72 hours after symptoms resolution (absence of fever without the use of fever-reducing medication and improvement in respiratory symptoms), whichever is longer Get plenty of rest and push fluids Use OTC medications like ibuprofen or tylenol as needed fever or pain Call or go to the ED if you have any new or worsening symptoms such as fever, worsening cough, shortness of breath, chest tightness, chest pain, turning blue, changes in mental status, etc...   Reviewed expectations re: course of current medical issues. Questions answered. Outlined signs and symptoms indicating need for more acute intervention. Patient verbalized understanding. After Visit Summary given.         Lestine Box, PA-C 11/06/19 1323

## 2019-11-07 ENCOUNTER — Telehealth (HOSPITAL_COMMUNITY): Payer: Self-pay | Admitting: Emergency Medicine

## 2019-11-07 LAB — NOVEL CORONAVIRUS, NAA: SARS-CoV-2, NAA: NOT DETECTED

## 2019-11-07 NOTE — Telephone Encounter (Signed)
Pt contacted and made aware of negative covid results.

## 2019-11-28 ENCOUNTER — Encounter: Payer: Self-pay | Admitting: Family Medicine

## 2019-11-28 MED ORDER — HYDROCODONE-ACETAMINOPHEN 5-325 MG PO TABS: 1.0000 | ORAL_TABLET | Freq: Four times a day (QID) | ORAL | 0 refills | Status: DC | PRN

## 2019-11-28 NOTE — Telephone Encounter (Signed)
Ok to refill??  Last office visit 04/19/2019  Last refill 10/24/2019

## 2019-12-15 ENCOUNTER — Ambulatory Visit: Payer: 59 | Admitting: Family Medicine

## 2019-12-21 ENCOUNTER — Other Ambulatory Visit: Payer: Self-pay | Admitting: Family Medicine

## 2019-12-25 NOTE — Telephone Encounter (Signed)
Ok to refill??  Last office visit 04/19/2019.  Last refill 09/26/2019, #2 refills.

## 2020-01-05 ENCOUNTER — Encounter: Payer: Self-pay | Admitting: Family Medicine

## 2020-01-05 MED ORDER — HYDROCODONE-ACETAMINOPHEN 5-325 MG PO TABS: 1.0000 | ORAL_TABLET | Freq: Four times a day (QID) | ORAL | 0 refills | Status: DC | PRN

## 2020-01-05 NOTE — Telephone Encounter (Signed)
Ok to refill??  Last office visit 04/19/2019.  Last refill 11/28/2019.

## 2020-02-07 ENCOUNTER — Other Ambulatory Visit: Payer: Self-pay

## 2020-02-07 ENCOUNTER — Ambulatory Visit (INDEPENDENT_AMBULATORY_CARE_PROVIDER_SITE_OTHER): Payer: 59 | Admitting: Family Medicine

## 2020-02-07 ENCOUNTER — Encounter: Payer: Self-pay | Admitting: Family Medicine

## 2020-02-07 VITALS — BP 122/72 | HR 88 | Temp 97.9°F | Resp 14 | Ht 64.0 in | Wt 243.0 lb

## 2020-02-07 DIAGNOSIS — E785 Hyperlipidemia, unspecified: Secondary | ICD-10-CM | POA: Diagnosis not present

## 2020-02-07 DIAGNOSIS — Z1231 Encounter for screening mammogram for malignant neoplasm of breast: Secondary | ICD-10-CM

## 2020-02-07 DIAGNOSIS — D519 Vitamin B12 deficiency anemia, unspecified: Secondary | ICD-10-CM | POA: Diagnosis not present

## 2020-02-07 DIAGNOSIS — G2581 Restless legs syndrome: Secondary | ICD-10-CM

## 2020-02-07 DIAGNOSIS — E559 Vitamin D deficiency, unspecified: Secondary | ICD-10-CM | POA: Diagnosis not present

## 2020-02-07 DIAGNOSIS — E669 Obesity, unspecified: Secondary | ICD-10-CM | POA: Diagnosis not present

## 2020-02-07 DIAGNOSIS — E119 Type 2 diabetes mellitus without complications: Secondary | ICD-10-CM

## 2020-02-07 DIAGNOSIS — E538 Deficiency of other specified B group vitamins: Secondary | ICD-10-CM | POA: Diagnosis not present

## 2020-02-07 MED ORDER — TRULICITY 1.5 MG/0.5ML ~~LOC~~ SOAJ: 1.5000 mg | SUBCUTANEOUS | 3 refills | Status: DC

## 2020-02-07 MED ORDER — PREGABALIN 150 MG PO CAPS: 150.0000 mg | ORAL_CAPSULE | Freq: Every day | ORAL | 1 refills | Status: DC

## 2020-02-07 MED ORDER — ESCITALOPRAM OXALATE 20 MG PO TABS: 20.0000 mg | ORAL_TABLET | Freq: Every day | ORAL | 1 refills | Status: DC

## 2020-02-07 MED ORDER — HYDROCODONE-ACETAMINOPHEN 5-325 MG PO TABS: 1.0000 | ORAL_TABLET | Freq: Four times a day (QID) | ORAL | 0 refills | Status: DC | PRN

## 2020-02-07 MED ORDER — BUSPIRONE HCL 7.5 MG PO TABS: 7.5000 mg | ORAL_TABLET | Freq: Two times a day (BID) | ORAL | 6 refills | Status: DC

## 2020-02-07 MED ORDER — ZOLPIDEM TARTRATE 10 MG PO TABS: 10.0000 mg | ORAL_TABLET | Freq: Every evening | ORAL | 1 refills | Status: DC | PRN

## 2020-02-07 NOTE — Progress Notes (Signed)
Subjective:    Patient ID: Sabrina Roberts, female    DOB: Jul 04, 1971, 49 y.o.   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 Has not been able to come in for visits She has also had multiple family members pass away in the past few weeks.  Needs to have all of her labs done and her medications were refilled  Has chronic back pain - she has appt being set up with spine specialist in [place] , also takes norco as needed 40 in the evening  MDD- she has been out of the lexapro, she has been taking buspar 7.5mg  BID, but some days only takes once a day.  She has noticed increased depression as she has been off of her Lexapro.  She has a lot of stressors surrounding her job and taking care of home life.  DM- taking trulicity , last [id number], typically  115-130's  Weight at work  239lbs gastric bypass.  She is taking most of her supplements vitamin D iron B12.  She is due for recheck on these levels.  She needs mammogram scheduled Review Of Systems:  GEN- denies fatigue, fever, weight loss,weakness, recent illness HEENT- denies eye drainage, change in vision, nasal discharge, CVS- denies chest pain, palpitations RESP- denies SOB, cough, wheeze ABD- denies N/V, change in stools, abd pain GU- denies dysuria, hematuria, dribbling, incontinence MSK- + joint pain, muscle aches, injury Neuro- denies headache, dizziness, syncope, seizure activity       Objective:    BP 122/72   Pulse 88   Temp 97.9 F (36.6 C) (Temporal)   Resp 14   Ht 5\' 4"  (1.626 m)   Wt 243 lb (110.2 kg)   LMP  (LMP Unknown)   SpO2 98%   BMI 41.71 kg/m  GEN- NAD, alert and oriented x3 HEENT- PERRL, EOMI, non injected sclera, pink conjunctiva, MMM, oropharynx clear Neck- Supple, no thyromegaly CVS- RRR, no murmur RESP-CTAB ABD-NABS,soft,NT,ND EXT-  No edema Pulses- Radial, DP- 2+        Assessment & Plan:      Problem List Items Addressed This Visit      Unprioritized   Anemia   Relevant Orders   Iron, TIBC and Ferritin Panel   B12 deficiency   Relevant Orders   Vitamin B12   Class 3 obesity    She has been trying to maintain her weight status post bariatric surgery.  GLP-1 therapy is also helping with this.  Check her vitamin levels      Relevant Orders   TSH   DM (diabetes mellitus) (HCC)    A1c goal is less than 7%.  Continue Trulicity this time.      Relevant Medications   Dulaglutide (TRULICITY) 1.5 0000000 SOPN   Other Relevant Orders   HM DIABETES FOOT EXAM (Completed)   Microalbumin / creatinine urine ratio   CBC with Differential/Platelet   Comprehensive metabolic panel   Hemoglobin A1c   Hyperlipidemia    Check lipid panel.  Goal is an LDL 100 or less      Relevant Orders   TSH   Lipid panel   RLS (restless legs syndrome)    She is out of her Lyrica.  This has helped control her restless legs as well as with her back pain.  Gabapentin was not  effective      Relevant Medications   pregabalin (LYRICA) 150 MG capsule   Vitamin D deficiency   Relevant Orders   Vitamin D, 25-hydroxy    Other Visit Diagnoses    Encounter for screening mammogram for malignant neoplasm of breast    -  Primary   Relevant Orders   MM 3D SCREEN BREAST BILATERAL      Note: This dictation was prepared with Dragon dictation along with smaller phrase technology. Any transcriptional errors that result from this process are unintentional.

## 2020-02-07 NOTE — Assessment & Plan Note (Signed)
A1c goal is less than 7%.  Continue Trulicity this time.

## 2020-02-07 NOTE — Assessment & Plan Note (Signed)
She is out of her Lyrica.  This has helped control her restless legs as well as with her back pain.  Gabapentin was not effective

## 2020-02-07 NOTE — Assessment & Plan Note (Signed)
She has been trying to maintain her weight status post bariatric surgery.  GLP-1 therapy is also helping with this.  Check her vitamin levels

## 2020-02-07 NOTE — Patient Instructions (Addendum)
Restart lexapro at 1/2 tablet for 2 weeks  Then go to 1 full tablet  We will call with lab results  Schedule your mammogram  F/U 4 months

## 2020-02-07 NOTE — Assessment & Plan Note (Signed)
Check lipid panel.  Goal is an LDL 100 or less

## 2020-02-09 LAB — TSH: TSH: 0.43 mIU/L

## 2020-02-09 LAB — CBC WITH DIFFERENTIAL/PLATELET
Absolute Monocytes: 529 cells/uL (ref 200–950)
Basophils Absolute: 40 cells/uL (ref 0–200)
Basophils Relative: 0.6 %
Eosinophils Absolute: 121 cells/uL (ref 15–500)
Eosinophils Relative: 1.8 %
HCT: 38.6 % (ref 35.0–45.0)
Hemoglobin: 13 g/dL (ref 11.7–15.5)
Lymphs Abs: 2492 cells/uL (ref 850–3900)
MCH: 31.3 pg (ref 27.0–33.0)
MCHC: 33.7 g/dL (ref 32.0–36.0)
MCV: 93 fL (ref 80.0–100.0)
MPV: 12 fL (ref 7.5–12.5)
Monocytes Relative: 7.9 %
Neutro Abs: 3518 cells/uL (ref 1500–7800)
Neutrophils Relative %: 52.5 %
Platelets: 304 10*3/uL (ref 140–400)
RBC: 4.15 10*6/uL (ref 3.80–5.10)
RDW: 12.7 % (ref 11.0–15.0)
Total Lymphocyte: 37.2 %
WBC: 6.7 10*3/uL (ref 3.8–10.8)

## 2020-02-09 LAB — COMPREHENSIVE METABOLIC PANEL
AG Ratio: 1.5 (calc) (ref 1.0–2.5)
ALT: 14 U/L (ref 6–29)
AST: 16 U/L (ref 10–35)
Albumin: 4.3 g/dL (ref 3.6–5.1)
Alkaline phosphatase (APISO): 114 U/L (ref 31–125)
BUN: 11 mg/dL (ref 7–25)
CO2: 22 mmol/L (ref 20–32)
Calcium: 9.3 mg/dL (ref 8.6–10.2)
Chloride: 107 mmol/L (ref 98–110)
Creat: 0.75 mg/dL (ref 0.50–1.10)
Globulin: 2.9 g/dL (calc) (ref 1.9–3.7)
Glucose, Bld: 91 mg/dL (ref 65–99)
Potassium: 4.1 mmol/L (ref 3.5–5.3)
Sodium: 140 mmol/L (ref 135–146)
Total Bilirubin: 0.8 mg/dL (ref 0.2–1.2)
Total Protein: 7.2 g/dL (ref 6.1–8.1)

## 2020-02-09 LAB — MICROALBUMIN / CREATININE URINE RATIO
Creatinine, Urine: 277 mg/dL — ABNORMAL HIGH (ref 20–275)
Microalb Creat Ratio: 4 mcg/mg creat (ref <30)
Microalb, Ur: 1.1 mg/dL

## 2020-02-09 LAB — IRON,TIBC AND FERRITIN PANEL
%SAT: 28 % (calc) (ref 16–45)
Ferritin: 18 ng/mL (ref 16–232)
Iron: 105 ug/dL (ref 40–190)
TIBC: 371 mcg/dL (calc) (ref 250–450)

## 2020-02-09 LAB — LIPID PANEL
Cholesterol: 158 mg/dL (ref <200)
HDL: 53 mg/dL (ref >=50)
LDL Cholesterol (Calc): 87 mg/dL (calc)
Non-HDL Cholesterol (Calc): 105 mg/dL (calc) (ref <130)
Total CHOL/HDL Ratio: 3 (calc) (ref <5.0)
Triglycerides: 85 mg/dL (ref <150)

## 2020-02-09 LAB — VITAMIN D 25 HYDROXY (VIT D DEFICIENCY, FRACTURES): Vit D, 25-Hydroxy: 15 ng/mL — ABNORMAL LOW (ref 30–100)

## 2020-02-09 LAB — VITAMIN B12: Vitamin B-12: 285 pg/mL (ref 200–1100)

## 2020-02-09 LAB — HEMOGLOBIN A1C
Hgb A1c MFr Bld: 6.1 % of total Hgb — ABNORMAL HIGH (ref <5.7)
Mean Plasma Glucose: 128 (calc)
eAG (mmol/L): 7.1 (calc)

## 2020-02-10 ENCOUNTER — Encounter: Payer: Self-pay | Admitting: Family Medicine

## 2020-02-12 ENCOUNTER — Other Ambulatory Visit: Payer: Self-pay | Admitting: *Deleted

## 2020-02-12 DIAGNOSIS — E538 Deficiency of other specified B group vitamins: Secondary | ICD-10-CM

## 2020-02-12 MED ORDER — BLOOD GLUCOSE SYSTEM PAK KIT: PACK | 1 refills | Status: DC

## 2020-02-12 MED ORDER — VITAMIN D (ERGOCALCIFEROL) 1.25 MG (50000 UNIT) PO CAPS: 50000.0000 [IU] | ORAL_CAPSULE | ORAL | 0 refills | Status: DC

## 2020-02-12 MED ORDER — CYANOCOBALAMIN 1000 MCG/ML IJ SOLN
1000.0000 ug | INTRAMUSCULAR | Status: DC
  Administered 2020-05-03 – 2021-05-01 (×2): 1000 ug via INTRAMUSCULAR

## 2020-02-12 MED ORDER — BLOOD GLUCOSE TEST VI STRP: ORAL_STRIP | 1 refills | Status: DC

## 2020-02-26 ENCOUNTER — Ambulatory Visit (HOSPITAL_COMMUNITY)
Admission: RE | Admit: 2020-02-26 | Discharge: 2020-02-26 | Disposition: A | Discharge status: Home / Self Care | Payer: 59 | Source: Ambulatory Visit | Attending: Family Medicine | Admitting: Family Medicine

## 2020-02-26 ENCOUNTER — Other Ambulatory Visit: Payer: Self-pay

## 2020-02-26 DIAGNOSIS — Z1231 Encounter for screening mammogram for malignant neoplasm of breast: Secondary | ICD-10-CM | POA: Insufficient documentation

## 2020-02-28 ENCOUNTER — Ambulatory Visit: Payer: Self-pay

## 2020-02-28 ENCOUNTER — Other Ambulatory Visit: Payer: Self-pay | Admitting: Family Medicine

## 2020-02-28 ENCOUNTER — Other Ambulatory Visit: Payer: Self-pay

## 2020-02-28 DIAGNOSIS — M545 Low back pain, unspecified: Secondary | ICD-10-CM

## 2020-02-28 DIAGNOSIS — M25562 Pain in left knee: Secondary | ICD-10-CM

## 2020-03-06 ENCOUNTER — Encounter: Payer: Self-pay | Admitting: Family Medicine

## 2020-03-06 MED ORDER — HYDROCODONE-ACETAMINOPHEN 5-325 MG PO TABS: 1.0000 | ORAL_TABLET | Freq: Four times a day (QID) | ORAL | 0 refills | Status: DC | PRN

## 2020-03-06 NOTE — Telephone Encounter (Signed)
Ok to refill??  Last office visit/ refill 02/07/2020.

## 2020-04-08 ENCOUNTER — Telehealth: Payer: Self-pay | Admitting: *Deleted

## 2020-04-08 NOTE — Telephone Encounter (Signed)
Received request from pharmacy for PA on Trulicity.   PA submitted.   Dx: E11.9- DM.  [employer] is reviewing your PA request. You may close this dialog, return to your dashboard, and perform other tasks. To check for an update later, open this request again from your dashboard. If [employer] has not replied within 24 hours for urgent requests or within 48 hours for standard requests, please contact [employer] at [phone].

## 2020-04-10 ENCOUNTER — Encounter (HOSPITAL_COMMUNITY): Payer: Self-pay

## 2020-04-10 ENCOUNTER — Emergency Department (HOSPITAL_COMMUNITY)
Admission: EM | Admit: 2020-04-10 | Discharge: 2020-04-11 | Disposition: A | Discharge status: Home / Self Care | Payer: 59 | Attending: Emergency Medicine | Admitting: Emergency Medicine

## 2020-04-10 ENCOUNTER — Other Ambulatory Visit: Payer: Self-pay

## 2020-04-10 DIAGNOSIS — I10 Essential (primary) hypertension: Secondary | ICD-10-CM | POA: Insufficient documentation

## 2020-04-10 DIAGNOSIS — Z91040 Latex allergy status: Secondary | ICD-10-CM | POA: Insufficient documentation

## 2020-04-10 DIAGNOSIS — Z20822 Contact with and (suspected) exposure to covid-19: Secondary | ICD-10-CM | POA: Diagnosis not present

## 2020-04-10 DIAGNOSIS — R45851 Suicidal ideations: Secondary | ICD-10-CM | POA: Insufficient documentation

## 2020-04-10 DIAGNOSIS — E119 Type 2 diabetes mellitus without complications: Secondary | ICD-10-CM | POA: Insufficient documentation

## 2020-04-10 DIAGNOSIS — F333 Major depressive disorder, recurrent, severe with psychotic symptoms: Secondary | ICD-10-CM | POA: Insufficient documentation

## 2020-04-10 DIAGNOSIS — Z03818 Encounter for observation for suspected exposure to other biological agents ruled out: Secondary | ICD-10-CM | POA: Diagnosis not present

## 2020-04-10 DIAGNOSIS — T1491XA Suicide attempt, initial encounter: Secondary | ICD-10-CM

## 2020-04-10 DIAGNOSIS — T50992A Poisoning by other drugs, medicaments and biological substances, intentional self-harm, initial encounter: Secondary | ICD-10-CM | POA: Diagnosis not present

## 2020-04-10 DIAGNOSIS — T50902A Poisoning by unspecified drugs, medicaments and biological substances, intentional self-harm, initial encounter: Secondary | ICD-10-CM | POA: Diagnosis present

## 2020-04-10 DIAGNOSIS — Z79899 Other long term (current) drug therapy: Secondary | ICD-10-CM | POA: Diagnosis not present

## 2020-04-10 LAB — COMPREHENSIVE METABOLIC PANEL
ALT: 20 U/L (ref 0–44)
AST: 20 U/L (ref 15–41)
Albumin: 3.8 g/dL (ref 3.5–5.0)
Alkaline Phosphatase: 97 U/L (ref 38–126)
Anion gap: 9 (ref 5–15)
BUN: 12 mg/dL (ref 6–20)
CO2: 25 mmol/L (ref 22–32)
Calcium: 8.6 mg/dL — ABNORMAL LOW (ref 8.9–10.3)
Chloride: 105 mmol/L (ref 98–111)
Creatinine, Ser: 0.64 mg/dL (ref 0.44–1.00)
GFR calc Af Amer: >60 mL/min (ref >60)
GFR calc non Af Amer: >60 mL/min (ref >60)
Glucose, Bld: 85 mg/dL (ref 70–99)
Potassium: 4.5 mmol/L (ref 3.5–5.1)
Sodium: 139 mmol/L (ref 135–145)
Total Bilirubin: 0.9 mg/dL (ref 0.3–1.2)
Total Protein: 6.9 g/dL (ref 6.5–8.1)

## 2020-04-10 LAB — RAPID URINE DRUG SCREEN, HOSP PERFORMED
Amphetamines: NOT DETECTED
Barbiturates: NOT DETECTED
Benzodiazepines: NOT DETECTED
Cocaine: NOT DETECTED
Opiates: NOT DETECTED
Tetrahydrocannabinol: NOT DETECTED

## 2020-04-10 LAB — CBC WITH DIFFERENTIAL/PLATELET
Abs Immature Granulocytes: 0.01 10*3/uL (ref 0.00–0.07)
Basophils Absolute: 0 10*3/uL (ref 0.0–0.1)
Basophils Relative: 1 %
Eosinophils Absolute: 0.1 10*3/uL (ref 0.0–0.5)
Eosinophils Relative: 2 %
HCT: 38 % (ref 36.0–46.0)
Hemoglobin: 11.9 g/dL — ABNORMAL LOW (ref 12.0–15.0)
Immature Granulocytes: 0 %
Lymphocytes Relative: 34 %
Lymphs Abs: 1.9 10*3/uL (ref 0.7–4.0)
MCH: 29.7 pg (ref 26.0–34.0)
MCHC: 31.3 g/dL (ref 30.0–36.0)
MCV: 94.8 fL (ref 80.0–100.0)
Monocytes Absolute: 0.5 10*3/uL (ref 0.1–1.0)
Monocytes Relative: 10 %
Neutro Abs: 2.9 10*3/uL (ref 1.7–7.7)
Neutrophils Relative %: 53 %
Platelets: 271 10*3/uL (ref 150–400)
RBC: 4.01 MIL/uL (ref 3.87–5.11)
RDW: 12.7 % (ref 11.5–15.5)
WBC: 5.4 10*3/uL (ref 4.0–10.5)
nRBC: 0 % (ref 0.0–0.2)

## 2020-04-10 LAB — ETHANOL: Alcohol, Ethyl (B): <10 mg/dL (ref <10)

## 2020-04-10 LAB — ACETAMINOPHEN LEVEL: Acetaminophen (Tylenol), Serum: <10 ug/mL — ABNORMAL LOW (ref 10–30)

## 2020-04-10 LAB — SALICYLATE LEVEL: Salicylate Lvl: <7 mg/dL — ABNORMAL LOW (ref 7.0–30.0)

## 2020-04-10 MED ORDER — ZOLPIDEM TARTRATE 5 MG PO TABS
5.0000 mg | ORAL_TABLET | Freq: Every evening | ORAL | Status: DC | PRN
  Administered 2020-04-11: 5 mg via ORAL
  Filled 2020-04-10: qty 1

## 2020-04-10 MED ORDER — LACTATED RINGERS IV BOLUS
1000.0000 mL | Freq: Once | INTRAVENOUS | Status: AC
  Administered 2020-04-10: 14:00:00 1000 mL via INTRAVENOUS
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
## 2020-04-10 NOTE — ED Notes (Addendum)
Sabrina Roberts, from [employer] contacted and recommendations given.  1. EKG now 2. Acetaminophen level now 3. CMP level now 4. Monitor pt for minimum of 12 hours after ingestion time. Therefore, this pt needs monitoring until 2100 tonight. 5. Mainly supportive care from this point. 6. BB overdose sheet faxed over and given to Dr. Regenia Skeeter. 7. Monitor for hypotension and bradycardia from Propranolol and drowsiness from Buspar.  8. Repeat EKG prior to discharge.  Dr. Regenia Skeeter made aware of recommendations.

## 2020-04-10 NOTE — ED Provider Notes (Signed)
Patient medically cleared from the beta-blocker overdose.  Heart rates in the 70s.  Blood pressures are normal.  Patient never developed any significant hypotension or bradycardia.  Patient now medically cleared.  Was observed as per poison control protocol until 9 PM.  Patient completely asymptomatic.  Will have behavioral health interview patient.   Fredia Sorrow, MD 04/10/20 2110

## 2020-04-10 NOTE — ED Notes (Signed)
Spoke with Poison Control and gave update on pt. Labs reviewed with them. They recommend EKG around 2000 tonight. Dr. Rogene Houston made aware. They will call back around 2100 to check on pt again.

## 2020-04-10 NOTE — ED Notes (Signed)
TTS being complete at this time.

## 2020-04-10 NOTE — ED Notes (Signed)
Anike Adaku, NP, recommends overnight observation for safety and stabilization with psych reassessment in the AM.

## 2020-04-10 NOTE — ED Notes (Signed)
Patient's IV removed/. Patient complained of the IV hurting.

## 2020-04-10 NOTE — ED Triage Notes (Addendum)
Pt states she took her daughter's Propranolol prescription containing 30 pills 60 mg each at 9 oclock this am. States this is due to stress at home.  Pt states she was depressed and wanted to kill herself by overdosing on pills.   Pt states she is unaware of what Propanolol is. "I just took whatever was closest to me".  Pt states she is no longer feeling suicidal and is concerned for health.  Vitals stable upon arrival to ED. Denies sx.

## 2020-04-10 NOTE — ED Provider Notes (Signed)
[facility] EMERGENCY DEPARTMENT Provider Note   CSN: [id number] Arrival date & time: 04/10/20  1302     History Chief Complaint  Patient presents with  . Drug Overdose    Sabrina Roberts is a 49 y.o. female.  HPI 49 year old female presents with intentional drug overdose.  At 9:30 AM she took 30 tablets of 60 mg ER propranolol.  These are her daughters.  She was depressed and states everything happened very quickly.  She transiently think she wanted to kill herself though she is not feeling suicidal right now.  She is still depressed.  She took the whole bottle which contained 30 tablets.  She did not even realize what medicine it was but just grab the first thing she saw.  A little later she took 10 of her BuSpar tablets.  Patient feels a little jittery right now but denies any dizziness/lightheadedness, chest pain, abdominal pain, vomiting.  Depression has been affecting her for the last several weeks.   Past Medical History:  Diagnosis Date  . Anemia   . Anxiety   . Arthritis   . Depression   . Diabetes mellitus   . Hyperlipidemia   . Hypertension   . Obesity   . Sleep apnea    Has CPAP but has lost weight and hasnt used    Patient Active Problem List   Diagnosis Date Noted  . RLS (restless legs syndrome) 02/07/2020  . Abnormal TSH 11/15/2018  . Rectal bleeding 11/04/2017  . Diarrhea 11/04/2017  . Thoracic disc herniation 09/06/2017  . Chronic back pain 09/06/2017  . Anemia 09/06/2017  . B12 deficiency 06/08/2017  . Vitamin D deficiency 06/08/2017  . Depression with anxiety 06/08/2017  . Bulging of lumbar intervertebral disc 06/08/2017  . Carpal tunnel syndrome of right wrist   . Borderline abnormal TFTs 10/29/2014  . Irregular menses 08/01/2013  . Insomnia 10/14/2012  . DM (diabetes mellitus) ([place]) 02/28/2012  . Hyperlipidemia 02/28/2012  . Class 3 obesity 02/28/2012  . Thyroid nodule 02/28/2012    Past Surgical History:  Procedure Laterality Date  .  CARPAL TUNNEL RELEASE Right 10/01/2016   Procedure: RIGHT CARPAL TUNNEL RELEASE;  Surgeon: Carole Civil, MD;  Location: [facility];  Service: Orthopedics;  Laterality: Right;  . CARPAL TUNNEL RELEASE Left 10/22/2016   Procedure: CARPAL TUNNEL RELEASE;  Surgeon: Carole Civil, MD;  Location: [facility];  Service: Orthopedics;  Laterality: Left;  . CESAREAN SECTION  2002  . CHOLECYSTECTOMY  dec 2012  . GASTRIC BYPASS  dec 2012  . KNEE ARTHROSCOPY Right      OB History   No obstetric history on file.     Family History  Problem Relation Age of Onset  . Hypertension Mother   . Depression Mother   . Diabetes Mother   . Irritable bowel syndrome Mother   . Heart disease Mother   . Stroke Mother   . Hypertension Father   . Hyperlipidemia Father   . Prostate cancer Father   . Colon polyps Father   . Heart disease Father   . Kidney Stones Father   . Diverticulitis Father   . Colon cancer Father   . Breast cancer Paternal Grandmother   . Colon cancer Paternal Grandfather   . Diabetes Maternal Grandmother   . Breast cancer Maternal Aunt   . Stomach cancer Paternal Uncle   . Colon cancer Other        Paternal Great Uncles x 4  . Colon polyps Paternal  Aunt        x 3    Social History   Tobacco Use  . Smoking status: Never Smoker  . Smokeless tobacco: Never Used  Substance Use Topics  . Alcohol use: Yes    Alcohol/week: 0.0 standard drinks    Comment: occasionally  . Drug use: No    Home Medications Prior to Admission medications   Medication Sig Start Date End Date Taking? Authorizing Provider  busPIRone (BUSPAR) 7.5 MG tablet Take 1 tablet (7.5 mg total) by mouth 2 (two) times daily. 02/07/20  Yes [place], Modena Nunnery, MD  Dulaglutide (TRULICITY) 1.5 0000000 SOPN Inject 1.5 mg into the skin once a week. 02/07/20  Yes [place], Modena Nunnery, MD  escitalopram (LEXAPRO) 20 MG tablet Take 1 tablet (20 mg total) by mouth daily. 02/07/20  Yes [place], Modena Nunnery, MD  ferrous sulfate  325 (65 FE) MG tablet Take 1 tablet (325 mg total) by mouth 2 (two) times daily with a meal. 05/26/19  Yes [place], Modena Nunnery, MD  HYDROcodone-acetaminophen (NORCO) 5-325 MG tablet Take 1 tablet by mouth every 6 (six) hours as needed for moderate pain. 03/06/20  Yes [place], Modena Nunnery, MD  Multiple Vitamin (MULTIVITAMIN) tablet Take 1 tablet by mouth daily.    Yes [provider]  pregabalin (LYRICA) 150 MG capsule Take 1 capsule (150 mg total) by mouth daily. 02/07/20  Yes [place], Modena Nunnery, MD  Vitamin D, Ergocalciferol, (DRISDOL) 1.25 MG (50000 UNIT) CAPS capsule Take 1 capsule (50,000 Units total) by mouth every 7 (seven) days. x6 months 02/12/20  Yes [place], Modena Nunnery, MD  zolpidem (AMBIEN) 10 MG tablet Take 1 tablet (10 mg total) by mouth at bedtime as needed. 02/07/20  Yes , Modena Nunnery, MD    Allergies    Actos [pioglitazone hydrochloride] and Latex  Review of Systems   Review of Systems  Respiratory: Negative for shortness of breath.   Cardiovascular: Negative for chest pain.  Gastrointestinal: Negative for abdominal pain and vomiting.  Neurological: Negative for dizziness and light-headedness.  Psychiatric/Behavioral: Positive for dysphoric mood and suicidal ideas.  All other systems reviewed and are negative.   Physical Exam Updated Vital Signs BP 109/60   Pulse 64   Temp 97.9 F (36.6 C) (Oral)   Resp 15   Ht 5\' 4"  (1.626 m)   Wt 111.1 kg   LMP  (LMP Unknown)   SpO2 99%   BMI 42.05 kg/m   Physical Exam Vitals and nursing note reviewed.  Constitutional:      General: She is not in acute distress.    Appearance: She is well-developed. She is obese. She is not ill-appearing or diaphoretic.  HENT:     Head: Normocephalic and atraumatic.     Right Ear: External ear normal.     Left Ear: External ear normal.     Nose: Nose normal.  Eyes:     General:        Right eye: No discharge.        Left eye: No discharge.  Cardiovascular:     Rate and Rhythm:  Normal rate and regular rhythm.     Heart sounds: Normal heart sounds.  Pulmonary:     Effort: Pulmonary effort is normal.     Breath sounds: Normal breath sounds.  Abdominal:     Palpations: Abdomen is soft.     Tenderness: There is no abdominal tenderness.  Skin:    General: Skin is warm and dry.  Neurological:  Mental Status: She is alert.  Psychiatric:        Mood and Affect: Mood is depressed. Mood is not anxious.     ED Results / Procedures / Treatments   Labs (all labs ordered are listed, but only abnormal results are displayed) Labs Reviewed  ACETAMINOPHEN LEVEL - Abnormal; Notable for the following components:      Result Value   Acetaminophen (Tylenol), Serum <10 (*)    All other components within normal limits  COMPREHENSIVE METABOLIC PANEL - Abnormal; Notable for the following components:   Calcium 8.6 (*)    All other components within normal limits  SALICYLATE LEVEL - Abnormal; Notable for the following components:   Salicylate Lvl Q000111Q (*)    All other components within normal limits  CBC WITH DIFFERENTIAL/PLATELET - Abnormal; Notable for the following components:   Hemoglobin 11.9 (*)    All other components within normal limits  ETHANOL  RAPID URINE DRUG SCREEN, HOSP PERFORMED    EKG EKG Interpretation  Date/Time:  Wednesday April 10 2020 13:41:25 EDT Ventricular Rate:  67 PR Interval:    QRS Duration: 100 QT Interval:  397 QTC Calculation: 420 R Axis:   12 Text Interpretation: Sinus rhythm Low voltage, precordial leads no significant change since Aug 2020 Confirmed by Sherwood Gambler [phone]) on 04/10/2020 2:13:41 PM   Radiology No results found.  Procedures Procedures (including critical care time)  Medications Ordered in ED Medications  lactated ringers bolus 1,000 mL (1,000 mLs Intravenous New Bag/Given 04/10/20 1354)    ED Course  I have reviewed the triage vital signs and the nursing notes.  Pertinent labs & imaging results that  were available during my care of the patient were reviewed by me and considered in my medical decision making (see chart for details).    MDM Rules/Calculators/A&P                      Patient's vital signs, including blood pressure and HR, have remained stable. She is well appearing.  Labs and ECG reviewed, no acute/emergent findings. Poison control recommending observation until 9 pm. At this point no signs of shock. Will need cardiac monitoring in ED. So far appears to have NSR. After cleared, will need TTS. Care to Dr. Rogene Houston.  Final Clinical Impression(s) / ED Diagnoses Final diagnoses:  Intentional drug overdose, initial encounter [facility])    Rx / DC Orders ED Discharge Orders    None       Sherwood Gambler, MD 04/10/20 1530

## 2020-04-11 DIAGNOSIS — I10 Essential (primary) hypertension: Secondary | ICD-10-CM | POA: Diagnosis not present

## 2020-04-11 DIAGNOSIS — Z91040 Latex allergy status: Secondary | ICD-10-CM | POA: Diagnosis not present

## 2020-04-11 DIAGNOSIS — F333 Major depressive disorder, recurrent, severe with psychotic symptoms: Secondary | ICD-10-CM | POA: Diagnosis not present

## 2020-04-11 DIAGNOSIS — Z20822 Contact with and (suspected) exposure to covid-19: Secondary | ICD-10-CM | POA: Diagnosis not present

## 2020-04-11 DIAGNOSIS — R45851 Suicidal ideations: Secondary | ICD-10-CM | POA: Diagnosis not present

## 2020-04-11 DIAGNOSIS — E119 Type 2 diabetes mellitus without complications: Secondary | ICD-10-CM | POA: Diagnosis not present

## 2020-04-11 DIAGNOSIS — Z79899 Other long term (current) drug therapy: Secondary | ICD-10-CM | POA: Diagnosis not present

## 2020-04-11 LAB — SARS CORONAVIRUS 2 (TAT 6-24 HRS): SARS Coronavirus 2: NEGATIVE
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
## 2020-04-11 NOTE — ED Provider Notes (Signed)
Emergency Medicine Observation Re-evaluation Note  Sabrina Roberts is a 49 y.o. female, seen on rounds today.  Pt initially presented to the ED for complaints of Drug Overdose Currently, the patient is resting comfortably in the ED with normal vitals and ambulatory without difficulty. TTS to evaluate this AM.  Physical Exam  BP 123/62 (BP Location: Left Arm)   Pulse 66   Temp 98.6 F (37 C) (Oral)   Resp 16   Ht 5\' 4"  (1.626 m)   Wt 111.1 kg   LMP  (LMP Unknown)   SpO2 96%   BMI 42.05 kg/m  Physical Exam  Well appearing, No acute distress.  Even, unlabored respirations.  Steady gait to restroom.   ED Course / MDM  EKG:EKG Interpretation  Date/Time:  Wednesday April 10 2020 13:41:25 EDT Ventricular Rate:  67 PR Interval:    QRS Duration: 100 QT Interval:  397 QTC Calculation: 420 R Axis:   12 Text Interpretation: Sinus rhythm Low voltage, precordial leads no significant change since Aug 2020 Confirmed by Sherwood Gambler [phone]) on 04/10/2020 2:13:41 PM    I have reviewed the labs performed to date as well as medications administered while in observation.  Recent changes in the last 24 hours include, N/A. Plan  Current plan is for have TTS re-evaluate this AM. No bradycardia or other OD side effects this AM.  11:30 AM Spoke with behavioral health who feel the patient is psychiatrically cleared after their reevaluation this morning.  I went back to independently evaluate the patient and she adamantly denies any suicidal or homicidal ideation.  She is feeling well with normal vital signs.  Plan for discharge at this time.    Margette Fast, MD 04/11/20 1131

## 2020-04-11 NOTE — ED Notes (Signed)
Pt awake, given phone per RN to call spouse. Pt also updated on disposition

## 2020-04-11 NOTE — Discharge Instructions (Signed)
Outpatient Psychiatry and Counseling  [facility]: Mobile Crisis Management 24 hours:  [phone]  [facility] of the [employer] sliding scale fee and walk in schedule: M-F 8am-12pm/1pm-3pm [place], [place] [address] [place], [place] [address] [phone]  [facility] (Former[facility] as [facility] [employer])- new patient walk-in appointments available Monday - Friday 8am -3pm.          [address] [place], [place] [address] [phone] or crisis line- [facility]/ Intensive Outpatient Therapy Program [place], [place] [address] [place]      [phone] [place], [place] [address]                 [facility]   [facility] [phone]. [place], [place] [address]   [employer] of Care          [address] Johnette Abraham  [place], [place] [address]       [phone]  [place], [place], [place] [address] [phone]  [facility]    [address] [place], [place] [address]     [place], [place] Joycelyn Man     [place] [address]     [phone]       [facility] [place] [address]  [facility]     [address], [place], [place], [place] [address] [place]     [address]     [place], [place] [address]     [phone]       [facility] [address] [place], [place] [address] [phone] Resources for Temporary  Residential Assistance/Crisis [facility]) M-F 8am-3pm   [address] [place], [place] [address]   [phone] Services include: laundry, barbering, support groups, case management, phone  & computer access, showers, AA/NA mtgs, mental health/substance abuse nurse, job skills class, disability information, VA assistance, spiritual classes, etc.   [facility]   [address], [place]     [place] (women and children)       [place]. [place], [place] [address] [phone] [email or web address] for application and process Application Required  [facility]   [address]    [place] [address]     [phone]                    [place]tha [place], [place] [address] [id number] application appt.) Application Required  [facility] (women only)    [place]     [place],  [address]  [phone]      Intake starts 6pm daily Need valid ID, SSC, & Police report [employer] [address] [place], [place] [id number] Application Required  [employer] (men only)     [place].      [place], [place]       [place] (Pregnant women only) [address] [place], [place]  [facility]      Elizabeth Dani [place].      [place], [place] [address]     [phone]             [facility] [address] [place], [place] 90 day commitment/SA/Application process  [facility](men only)     [address]     [place], [place]       Check-in at [facility] [address] [place], [place] [address] [phone] Men/Women/Women and Children  must be there by 7 pm  [place], [place]

## 2020-04-11 NOTE — ED Notes (Signed)
Poison control notified of patient's condition that patient was a sinus rhythm on the monitor and an updated list of vital signs were given to poison control. Patient has been alert and oriented x 4 and ambulatory to the bathroom.

## 2020-04-11 NOTE — ED Notes (Signed)
Pt ambulatory to bathroom

## 2020-04-18 ENCOUNTER — Other Ambulatory Visit: Payer: Self-pay | Admitting: Family Medicine

## 2020-04-18 ENCOUNTER — Encounter: Payer: Self-pay | Admitting: Family Medicine

## 2020-04-18 NOTE — Telephone Encounter (Signed)
Call placed to [employer] to inquire as to status of PA.   Advised that further information required.   Information provided via telephone.   Will fax determination <48 hrs.

## 2020-04-18 NOTE — Telephone Encounter (Signed)
Ok to refill??  Last office visit 02/07/2020.  Last refill 03/06/2020.

## 2020-04-18 NOTE — Telephone Encounter (Signed)
Ok to refill??  Last office visit/ refill 02/07/2020, #1 refill.

## 2020-04-19 NOTE — Telephone Encounter (Signed)
Secondary to recent hospitalization narcotic pain medication cannot be called in. Please schedule office visit to discuss

## 2020-04-22 NOTE — Telephone Encounter (Signed)
Received PA determination.   Reports that PA is not required as medication is a covered benefit.

## 2020-04-23 ENCOUNTER — Encounter: Payer: Self-pay | Admitting: Family Medicine

## 2020-05-03 ENCOUNTER — Other Ambulatory Visit: Payer: Self-pay

## 2020-05-03 ENCOUNTER — Ambulatory Visit (INDEPENDENT_AMBULATORY_CARE_PROVIDER_SITE_OTHER): Payer: 59 | Admitting: Family Medicine

## 2020-05-03 ENCOUNTER — Encounter: Payer: Self-pay | Admitting: Family Medicine

## 2020-05-03 VITALS — BP 122/64 | HR 92 | Temp 98.1°F | Resp 14 | Ht 64.0 in | Wt 252.0 lb

## 2020-05-03 DIAGNOSIS — E538 Deficiency of other specified B group vitamins: Secondary | ICD-10-CM | POA: Diagnosis not present

## 2020-05-03 DIAGNOSIS — F411 Generalized anxiety disorder: Secondary | ICD-10-CM

## 2020-05-03 DIAGNOSIS — F5101 Primary insomnia: Secondary | ICD-10-CM | POA: Diagnosis not present

## 2020-05-03 DIAGNOSIS — F331 Major depressive disorder, recurrent, moderate: Secondary | ICD-10-CM | POA: Diagnosis not present

## 2020-05-03 MED ORDER — HYDROCODONE-ACETAMINOPHEN 5-325 MG PO TABS: 1.0000 | ORAL_TABLET | Freq: Four times a day (QID) | ORAL | 0 refills | Status: DC | PRN

## 2020-05-03 MED ORDER — ZOLPIDEM TARTRATE 10 MG PO TABS: 10.0000 mg | ORAL_TABLET | Freq: Every evening | ORAL | 0 refills | Status: DC | PRN

## 2020-05-03 NOTE — Patient Instructions (Addendum)
Referral to psychiatry F/U 2 months

## 2020-05-03 NOTE — Assessment & Plan Note (Signed)
She will continue with her current dose BuSpar 7.5 mg twice a day and Lexapro no changes were made at the hospital.  She is quite remorseful regarding taking the multiple tablets.  He does not have any active suicidal ideations.  She is willing to see a psychiatrist as well as a therapist to speak ongoing stressors with her family that may have led up to this.  She is learning how to say no to people that are trying to take advantage of her and cause more stress.    I did discuss the effects of her medication and that Ambien and her hydrocodone are both controlled substances that are very dangerous for her she does not take them as prescribed which she voiced understanding.  I am going to prescribe 30 days of Ambien as she has not been sleeping well.  I have also given her 30 tablets of hydrocodone that she uses for her chronic back pain she had a recent fall at work and is also on Lyrica.  She is aware  to seek help at the emergency room if she has any suicidal thoughts.

## 2020-05-03 NOTE — Progress Notes (Signed)
Subjective:    Patient ID: Sabrina Roberts, female    DOB: 14-Dec-1971, 49 y.o.   MRN: [id number]  Patient presents for Hospital F/U and Vit B 12 Injection  Patient here for hospital follow-up.  She was admitted to [facility] observation secondary to intentional drug overdose April 10, 2020. On review the emergency room note she is feeling very depressed.  She took 30 tablets of 60 mg extended release propanolol.  She then later took 10 tablets of BuSpar. Poison control was contacted and she was observed in the emergency room and then transition to psychiatric holding She states to psychiatry that she wasn't trying to kill herself she didn't know what she was taking was quite appears about the entire situation.  She has been under significant stress with her husband's declining health and his psychiatric illness he also been in the hospital recently with an infection in his hands, she had a fall at work and was having to [employer]. and her daughter had rec the new car. She was cleared by psychiatry to follow-up with Dr. Dwyane Dee at discharge but there is some insurance issue with coverage so appt has not been made   No changes were made in her medications She is still taking lexapro 20mg  once a day, buspar 7.5mg  BID  Chronic insomia she is out of ambien 10mg  , she was taking Sunday-Thursday on nights only She only takes [place] before 10pm  She has slept well since she left the hospital because she is out of meds   Did speak with her boss and has had a change in her work schedule which she thinks will be helpful she would not have very long days and nights to work.  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  She is also learning to say no to family members and friends that are always asking her her favors and to help them which stresses her out.    She received call from [employer] for therpay services    Urine drug screen was negative.  She has not taken any hydrocodone  recently Metabolic panel, CBC ,alcohol level acetaminophen level were all unremarkable   Pt would also like to get B12 injection  Norco- takes in the evening a few days a weekfor back pain, had recent fall at work, and uses pain med  She is getting scheduled with back specilaist          Review Of Systems:  GEN- denies fatigue, fever, weight loss,weakness, recent illness HEENT- denies eye drainage, change in vision, nasal discharge, CVS- denies chest pain, palpitations RESP- denies SOB, cough, wheeze ABD- denies N/V, change in stools, abd pain GU- denies dysuria, hematuria, dribbling, incontinence MSK-+ joint pain, muscle aches, injury Neuro- denies headache, dizziness, syncope, seizure activity       Objective:    BP 122/64   Pulse 92   Temp 98.1 F (36.7 C) (Temporal)   Resp 14   Ht 5\' 4"  (1.626 m)   Wt 252 lb (114.3 kg)   LMP  (LMP Unknown)   SpO2 100%   BMI 43.26 kg/m  GEN- NAD, alert and oriented x3 HEENT- PERRL, EOMI, non injected sclera, pink conjunctiva, MMM, oropharynx clear CVS- RRR, no murmur RESP-CTAB Psych- normal affect and mood, polite, good eye contact, well groomed, no SI, no hallucinations EXT- No edema Pulses- Radial, 2+   PHQ 9 score 13        Assessment & Plan:  Problem List Items Addressed This Visit      Unprioritized   GAD (generalized anxiety disorder)   Relevant Orders   Ambulatory referral to Psychiatry   Insomnia   Relevant Orders   Ambulatory referral to Psychiatry   MDD (major depressive disorder) - Primary    She will continue with her current dose BuSpar 7.5 mg twice a day and Lexapro no changes were made at the hospital.  She is quite remorseful regarding taking the multiple tablets.  He does not have any active suicidal ideations.  She is willing to see a psychiatrist as well as a therapist to speak ongoing stressors with her family that may have led up to this.  She is learning how to say no to people that are  trying to take advantage of her and cause more stress.    I did discuss the effects of her medication and that Ambien and her hydrocodone are both controlled substances that are very dangerous for her she does not take them as prescribed which she voiced understanding.  I am going to prescribe 30 days of Ambien as she has not been sleeping well.  I have also given her 30 tablets of hydrocodone that she uses for her chronic back pain she had a recent fall at work and is also on Lyrica.  She is aware  to seek help at the emergency room if she has any suicidal thoughts.      Relevant Orders   Ambulatory referral to Psychiatry      Note: This dictation was prepared with Dragon dictation along with smaller phrase technology. Any transcriptional errors that result from this process are unintentional.

## 2020-05-29 ENCOUNTER — Other Ambulatory Visit: Payer: Self-pay | Admitting: Family Medicine

## 2020-05-29 NOTE — Telephone Encounter (Signed)
Ok to refill??  Last office visit/ refill 05/03/2020.

## 2020-06-06 ENCOUNTER — Encounter: Payer: Self-pay | Admitting: Family Medicine

## 2020-06-06 MED ORDER — TRULICITY 1.5 MG/0.5ML ~~LOC~~ SOAJ: 1.5000 mg | SUBCUTANEOUS | 3 refills | Status: DC

## 2020-06-13 DIAGNOSIS — E559 Vitamin D deficiency, unspecified: Secondary | ICD-10-CM | POA: Diagnosis not present

## 2020-06-13 DIAGNOSIS — G603 Idiopathic progressive neuropathy: Secondary | ICD-10-CM | POA: Diagnosis not present

## 2020-06-13 DIAGNOSIS — G5601 Carpal tunnel syndrome, right upper limb: Secondary | ICD-10-CM | POA: Diagnosis not present

## 2020-06-13 DIAGNOSIS — M5417 Radiculopathy, lumbosacral region: Secondary | ICD-10-CM | POA: Diagnosis not present

## 2020-06-13 DIAGNOSIS — M5412 Radiculopathy, cervical region: Secondary | ICD-10-CM | POA: Diagnosis not present

## 2020-06-13 DIAGNOSIS — Z79899 Other long term (current) drug therapy: Secondary | ICD-10-CM | POA: Diagnosis not present

## 2020-06-13 DIAGNOSIS — M79605 Pain in left leg: Secondary | ICD-10-CM | POA: Diagnosis not present

## 2020-06-13 DIAGNOSIS — R7301 Impaired fasting glucose: Secondary | ICD-10-CM | POA: Diagnosis not present

## 2020-06-13 DIAGNOSIS — R202 Paresthesia of skin: Secondary | ICD-10-CM | POA: Diagnosis not present

## 2020-06-13 DIAGNOSIS — G609 Hereditary and idiopathic neuropathy, unspecified: Secondary | ICD-10-CM | POA: Diagnosis not present

## 2020-06-13 DIAGNOSIS — R201 Hypoesthesia of skin: Secondary | ICD-10-CM | POA: Diagnosis not present

## 2020-06-27 DIAGNOSIS — Z79899 Other long term (current) drug therapy: Secondary | ICD-10-CM | POA: Diagnosis not present

## 2020-06-27 DIAGNOSIS — M25551 Pain in right hip: Secondary | ICD-10-CM | POA: Diagnosis not present

## 2020-06-27 DIAGNOSIS — M5417 Radiculopathy, lumbosacral region: Secondary | ICD-10-CM | POA: Diagnosis not present

## 2020-06-27 DIAGNOSIS — R202 Paresthesia of skin: Secondary | ICD-10-CM | POA: Diagnosis not present

## 2020-07-25 DIAGNOSIS — Z79899 Other long term (current) drug therapy: Secondary | ICD-10-CM | POA: Diagnosis not present

## 2020-07-25 DIAGNOSIS — M5417 Radiculopathy, lumbosacral region: Secondary | ICD-10-CM | POA: Diagnosis not present

## 2020-07-25 DIAGNOSIS — G5601 Carpal tunnel syndrome, right upper limb: Secondary | ICD-10-CM | POA: Diagnosis not present

## 2020-07-25 DIAGNOSIS — G603 Idiopathic progressive neuropathy: Secondary | ICD-10-CM | POA: Diagnosis not present

## 2020-08-08 ENCOUNTER — Other Ambulatory Visit: Payer: Self-pay | Admitting: Family Medicine

## 2020-08-08 NOTE — Telephone Encounter (Signed)
Ok to refill??  Last office visit 05/03/2020.  Last refill 05/29/2020, #1 refill.

## 2020-08-09 ENCOUNTER — Encounter: Payer: Self-pay | Admitting: Family Medicine

## 2020-08-22 DIAGNOSIS — M79605 Pain in left leg: Secondary | ICD-10-CM | POA: Diagnosis not present

## 2020-08-22 DIAGNOSIS — R202 Paresthesia of skin: Secondary | ICD-10-CM | POA: Diagnosis not present

## 2020-08-22 DIAGNOSIS — Z79899 Other long term (current) drug therapy: Secondary | ICD-10-CM | POA: Diagnosis not present

## 2020-08-22 DIAGNOSIS — M5417 Radiculopathy, lumbosacral region: Secondary | ICD-10-CM | POA: Diagnosis not present

## 2020-08-22 DIAGNOSIS — M79604 Pain in right leg: Secondary | ICD-10-CM | POA: Diagnosis not present

## 2020-09-04 ENCOUNTER — Encounter: Payer: Self-pay | Admitting: Family Medicine

## 2020-09-09 ENCOUNTER — Telehealth: Payer: Self-pay | Admitting: *Deleted

## 2020-09-09 NOTE — Telephone Encounter (Signed)
Received call from patient.   Reports that she has had recent deaths in the family and is requesting medication for her anxiety.   Of note, had advised patient to schedule appointment to discuss with PCP, but has not done at this time.   MD please advise.

## 2020-09-09 NOTE — Telephone Encounter (Signed)
She can do telehealth visit if she cant come in persn,  she has buspar and lexapro, these may need to be adjusted

## 2020-09-10 ENCOUNTER — Encounter: Payer: Self-pay | Admitting: Family Medicine

## 2020-09-10 ENCOUNTER — Ambulatory Visit (INDEPENDENT_AMBULATORY_CARE_PROVIDER_SITE_OTHER): Payer: 59 | Admitting: Family Medicine

## 2020-09-10 ENCOUNTER — Other Ambulatory Visit: Payer: Self-pay

## 2020-09-10 DIAGNOSIS — F410 Panic disorder [episodic paroxysmal anxiety] without agoraphobia: Secondary | ICD-10-CM | POA: Diagnosis not present

## 2020-09-10 DIAGNOSIS — F4321 Adjustment disorder with depressed mood: Secondary | ICD-10-CM | POA: Diagnosis not present

## 2020-09-10 DIAGNOSIS — E538 Deficiency of other specified B group vitamins: Secondary | ICD-10-CM

## 2020-09-10 DIAGNOSIS — F331 Major depressive disorder, recurrent, moderate: Secondary | ICD-10-CM | POA: Diagnosis not present

## 2020-09-10 DIAGNOSIS — R946 Abnormal results of thyroid function studies: Secondary | ICD-10-CM | POA: Diagnosis not present

## 2020-09-10 DIAGNOSIS — E119 Type 2 diabetes mellitus without complications: Secondary | ICD-10-CM

## 2020-09-10 DIAGNOSIS — E559 Vitamin D deficiency, unspecified: Secondary | ICD-10-CM

## 2020-09-10 DIAGNOSIS — F411 Generalized anxiety disorder: Secondary | ICD-10-CM | POA: Diagnosis not present

## 2020-09-10 DIAGNOSIS — F4001 Agoraphobia with panic disorder: Secondary | ICD-10-CM | POA: Insufficient documentation

## 2020-09-10 MED ORDER — BUSPIRONE HCL 7.5 MG PO TABS: 7.5000 mg | ORAL_TABLET | Freq: Three times a day (TID) | ORAL | 2 refills | Status: DC

## 2020-09-10 MED ORDER — PREGABALIN 300 MG PO CAPS: 300.0000 mg | ORAL_CAPSULE | Freq: Two times a day (BID) | ORAL | 2 refills | Status: DC

## 2020-09-10 MED ORDER — DIAZEPAM 5 MG PO TABS: 5.0000 mg | ORAL_TABLET | Freq: Two times a day (BID) | ORAL | 1 refills | Status: DC | PRN
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
## 2020-09-10 NOTE — Telephone Encounter (Signed)
Call placed to patient and patient made aware.   Appointment scheduled.  

## 2020-10-17 ENCOUNTER — Other Ambulatory Visit: Payer: Self-pay | Admitting: Family Medicine

## 2020-11-19 ENCOUNTER — Other Ambulatory Visit: Payer: Self-pay | Admitting: Family Medicine

## 2020-11-19 NOTE — Telephone Encounter (Signed)
Ok to refill??  Last office visit 09/10/2020.  Last refill on Ambien 08/09/2020, #2 refills.   Last refill on diazepam 09/10/2020, #1 refill.

## 2020-11-28 DIAGNOSIS — M542 Cervicalgia: Secondary | ICD-10-CM | POA: Diagnosis not present

## 2020-11-28 DIAGNOSIS — Z79899 Other long term (current) drug therapy: Secondary | ICD-10-CM | POA: Diagnosis not present

## 2020-11-28 DIAGNOSIS — G5601 Carpal tunnel syndrome, right upper limb: Secondary | ICD-10-CM | POA: Diagnosis not present

## 2020-11-28 DIAGNOSIS — M545 Low back pain, unspecified: Secondary | ICD-10-CM | POA: Diagnosis not present

## 2020-11-28 DIAGNOSIS — R202 Paresthesia of skin: Secondary | ICD-10-CM | POA: Diagnosis not present

## 2020-12-11 ENCOUNTER — Ambulatory Visit
Admission: EM | Admit: 2020-12-11 | Discharge: 2020-12-11 | Disposition: A | Discharge status: Home / Self Care | Payer: 59 | Attending: Physician Assistant | Admitting: Physician Assistant

## 2020-12-11 ENCOUNTER — Other Ambulatory Visit: Payer: Self-pay

## 2020-12-11 DIAGNOSIS — Z1152 Encounter for screening for COVID-19: Secondary | ICD-10-CM | POA: Diagnosis not present

## 2020-12-11 NOTE — ED Triage Notes (Signed)
Needs covid test for exposure

## 2020-12-13 LAB — SARS-COV-2, NAA 2 DAY TAT

## 2020-12-13 LAB — NOVEL CORONAVIRUS, NAA: SARS-CoV-2, NAA: NOT DETECTED

## 2021-02-24 ENCOUNTER — Other Ambulatory Visit: Payer: Self-pay | Admitting: Family Medicine

## 2021-03-19 ENCOUNTER — Encounter: Payer: Self-pay | Admitting: Nurse Practitioner

## 2021-03-19 ENCOUNTER — Ambulatory Visit (INDEPENDENT_AMBULATORY_CARE_PROVIDER_SITE_OTHER): Payer: No Typology Code available for payment source | Admitting: Nurse Practitioner

## 2021-03-19 ENCOUNTER — Other Ambulatory Visit: Payer: Self-pay

## 2021-03-19 VITALS — BP 142/98 | HR 78 | Temp 98.2°F | Ht 64.0 in | Wt 234.8 lb

## 2021-03-19 DIAGNOSIS — F411 Generalized anxiety disorder: Secondary | ICD-10-CM

## 2021-03-19 DIAGNOSIS — R5383 Other fatigue: Secondary | ICD-10-CM | POA: Diagnosis not present

## 2021-03-19 DIAGNOSIS — K649 Unspecified hemorrhoids: Secondary | ICD-10-CM

## 2021-03-19 DIAGNOSIS — R7989 Other specified abnormal findings of blood chemistry: Secondary | ICD-10-CM

## 2021-03-19 DIAGNOSIS — E785 Hyperlipidemia, unspecified: Secondary | ICD-10-CM

## 2021-03-19 DIAGNOSIS — E669 Obesity, unspecified: Secondary | ICD-10-CM

## 2021-03-19 DIAGNOSIS — E559 Vitamin D deficiency, unspecified: Secondary | ICD-10-CM

## 2021-03-19 DIAGNOSIS — K146 Glossodynia: Secondary | ICD-10-CM

## 2021-03-19 DIAGNOSIS — E119 Type 2 diabetes mellitus without complications: Secondary | ICD-10-CM

## 2021-03-19 LAB — URINALYSIS, ROUTINE W REFLEX MICROSCOPIC
Bilirubin Urine: NEGATIVE
Glucose, UA: NEGATIVE
Hgb urine dipstick: NEGATIVE
Ketones, ur: NEGATIVE
Leukocytes,Ua: NEGATIVE
Nitrite: NEGATIVE
Protein, ur: NEGATIVE
Specific Gravity, Urine: 1.025 (ref 1.001–1.03)
pH: 5.5 (ref 5.0–8.0)

## 2021-03-19 NOTE — Progress Notes (Signed)
Subjective:    Patient ID: Sabrina Roberts, female    DOB: 1971-05-07, 50 y.o.   MRN: [id number]  HPI: Sabrina Roberts is a 50 y.o. female presenting for being tired and "want all of my levels checked."  Chief Complaint  Patient presents with  . Fatigue   FATIGUE Reports her tongue is numb - was white for 2 days; if she eats soda or mustard, burns her tongue.  Now, tongue is burning and her lips are starting to tingle. Duration:  months Severity: moderate  Onset: gradual Context when symptoms started:  unknown Symptoms improve with rest: yes  Depressive symptoms: yes Stress/anxiety: yes Insomnia: no  Forgetful: yes Snoring: yes; used to be on CPAP machine Observed apnea by bed partner: yes Daytime hypersomnolence:no Wakes feeling refreshed: no History of sleep study: yes; long time ago Dysnea on exertion:  no Orthopnea/PND: no Chest pain: no Chronic cough: no Lower extremity edema: no Arthralgias:no Myalgias: no Weakness: yes Rash: no  Also reports she recently had a bleeding hemorrhoid; it has resolved for now but would like to see a new GI provider in [place].  Is asking for a referral today.   Depression/Anxiety - she is asking for a referral to a counselor today.  Allergies  Allergen Reactions  . Actos [Pioglitazone Hydrochloride] Anaphylaxis, Hives and Other (See Comments)    Chest pain   . Latex Rash    Outpatient Encounter Medications as of 03/19/2021  Medication Sig  . busPIRone (BUSPAR) 7.5 MG tablet Take 1 tablet by mouth twice daily  . diazepam (VALIUM) 5 MG tablet TAKE 1 TABLET BY MOUTH EVERY 12 HOURS AS NEEDED FOR ANXIETY  . Dulaglutide (TRULICITY) 1.5 BT/5.1VO SOPN Inject 0.5 mLs (1.5 mg total) into the skin once a week.  . escitalopram (LEXAPRO) 20 MG tablet Take 1 tablet by mouth once daily  . HYDROcodone-acetaminophen (NORCO) 5-325 MG tablet Take 1 tablet by mouth every 6 (six) hours as needed for moderate pain.  . Multiple Vitamin  (MULTIVITAMIN) tablet Take 1 tablet by mouth daily.   . pregabalin (LYRICA) 300 MG capsule Take 1 capsule by mouth twice daily  . zolpidem (AMBIEN) 10 MG tablet TAKE 1 TABLET BY MOUTH ONCE DAILY AT BEDTIME AS NEEDED  . [DISCONTINUED] ferrous sulfate 325 (65 FE) MG tablet Take 1 tablet (325 mg total) by mouth 2 (two) times daily with a meal.  . [DISCONTINUED] Vitamin D, Ergocalciferol, (DRISDOL) 1.25 MG (50000 UNIT) CAPS capsule Take 1 capsule (50,000 Units total) by mouth every 7 (seven) days. x6 months   Facility-Administered Encounter Medications as of 03/19/2021  Medication  . cyanocobalamin ((VITAMIN B-12)) injection 1,000 mcg    Patient Active Problem List   Diagnosis Date Noted  . Panic attacks 09/10/2020  . GAD (generalized anxiety disorder) 05/03/2020  . RLS (restless legs syndrome) 02/07/2020  . Abnormal TSH 11/15/2018  . Rectal bleeding 11/04/2017  . Diarrhea 11/04/2017  . Thoracic disc herniation 09/06/2017  . Chronic back pain 09/06/2017  . Anemia 09/06/2017  . B12 deficiency 06/08/2017  . Vitamin D deficiency 06/08/2017  . MDD (major depressive disorder) 06/08/2017  . Bulging of lumbar intervertebral disc 06/08/2017  . Carpal tunnel syndrome of right wrist   . Borderline abnormal TFTs 10/29/2014  . Irregular menses 08/01/2013  . Insomnia 10/14/2012  . DM (diabetes mellitus) ([place]) 02/28/2012  . Hyperlipidemia 02/28/2012  . Class 3 obesity 02/28/2012  . Thyroid nodule 02/28/2012    Past Medical History:  Diagnosis Date  .  Anemia   . Anxiety   . Arthritis   . Depression   . Diabetes mellitus   . Hyperlipidemia   . Hypertension   . Obesity   . Sleep apnea    Has CPAP but has lost weight and hasnt used    Relevant past medical, surgical, family and social history reviewed and updated as indicated. Interim medical history since our last visit reviewed.  Review of Systems Per HPI unless specifically indicated above     Objective:    BP (!) 142/98 (BP  Location: Left Arm, Patient Position: Sitting)   Pulse 78   Temp 98.2 F (36.8 C) (Oral)   Ht 5\' 4"  (1.626 m)   Wt 234 lb 12.8 oz (106.5 kg)   LMP  (LMP Unknown)   SpO2 97%   BMI 40.30 kg/m   Wt Readings from Last 3 Encounters:  03/19/21 234 lb 12.8 oz (106.5 kg)  05/03/20 252 lb (114.3 kg)  04/10/20 245 lb (111.1 kg)    Physical Exam Vitals reviewed.  Constitutional:      General: She is not in acute distress.    Appearance: Normal appearance. She is obese. She is not toxic-appearing.  HENT:     Head: Normocephalic and atraumatic.     Right Ear: External ear normal.     Left Ear: External ear normal.  Eyes:     General: No scleral icterus.    Extraocular Movements: Extraocular movements intact.     Pupils: Pupils are equal, round, and reactive to light.  Cardiovascular:     Rate and Rhythm: Normal rate.     Heart sounds: Normal heart sounds. No murmur heard.   Pulmonary:     Effort: Pulmonary effort is normal. No respiratory distress.     Breath sounds: Normal breath sounds. No wheezing, rhonchi or rales.  Musculoskeletal:     Cervical back: Normal range of motion.  Lymphadenopathy:     Cervical: No cervical adenopathy.  Skin:    General: Skin is warm and dry.     Capillary Refill: Capillary refill takes less than 2 seconds.     Coloration: Skin is not jaundiced or pale.     Findings: No erythema.  Neurological:     Mental Status: She is alert and oriented to person, place, and time.     Motor: No weakness.     Gait: Gait normal.  Psychiatric:        Mood and Affect: Mood normal.        Behavior: Behavior normal.        Thought Content: Thought content normal.        Judgment: Judgment normal.       Assessment & Plan:   Problem List Items Addressed This Visit      Endocrine   DM (diabetes mellitus) ([place])    Chronic.  Last A1c in February 2021 was 6.1%.  She is still taking Trulicity-we will continue this for now.  Hemoglobin A1c checked today along with  urine microalbumin.  Patient is due for foot and eye exam-will address at upcoming visit.  Follow-up pending lab work.      Relevant Orders   Hemoglobin A1c   COMPLETE METABOLIC PANEL WITH GFR   Urinalysis, Routine w reflex microscopic (Completed)   Microalbumin/Creatinine Ratio, Urine     Other   Abnormal TSH    TSH in February 2021 was normal, however given fatigue will recheck TSH today along with blood counts.  Previously,  TSH was low indicating hyperthyroidism.  Patient does not have symptoms of hyperthyroidism today.      Class 3 obesity    Patient has maintained her weight, however would like to continue to lose weight.  She states that she is getting motivated again and wants to start exercising and eating a lot healthier.  Continue Trulicity for now.  We will check TSH, vitamin B12, cholesterol levels, and vitamin D level today.      Relevant Orders   Lipid panel   GAD (generalized anxiety disorder)    Chronic.  Will place referral to psychology today for counseling per patient request.      Relevant Orders   Ambulatory referral to Psychology   Hyperlipidemia    Previously controlled with diet.  Will check cholesterol levels today-patient is fasting.  Goal LDL is less than 100.      Vitamin D deficiency    Previously on high-dose prescription supplementation.  Will check vitamin D level today-May need to restart high-dose supplementation and then followed by daily low-dose over-the-counter.      Relevant Orders   VITAMIN D 25 Hydroxy (Vit-D Deficiency, Fractures)    Other Visit Diagnoses    Fatigue, unspecified type    -  Primary   Working up with lab work today.  Does have a history of sleep apnea, will place referral for sleep study today. Physical examination is unremarkable.   Relevant Orders   TSH   CBC with Differential/Platelet   Vitamin B12   VITAMIN D 25 Hydroxy (Vit-D Deficiency, Fractures)   Ambulatory referral to Pulmonology   Hemorrhoids,  unspecified hemorrhoid type       Referral will be placed to GI for ongoing hemorrhoids.   Relevant Orders   Ambulatory referral to Gastroenterology   Tongue burning sensation       Relevant Orders   Vitamin B12       Follow up plan: Return for pending lab work.

## 2021-03-19 NOTE — Assessment & Plan Note (Signed)
Patient has maintained her weight, however would like to continue to lose weight.  She states that she is getting motivated again and wants to start exercising and eating a lot healthier.  Continue Trulicity for now.  We will check TSH, vitamin B12, cholesterol levels, and vitamin D level today.

## 2021-03-19 NOTE — Assessment & Plan Note (Signed)
Previously on high-dose prescription supplementation.  Will check vitamin D level today-May need to restart high-dose supplementation and then followed by daily low-dose over-the-counter.

## 2021-03-19 NOTE — Patient Instructions (Signed)
F/u pending blood work.

## 2021-03-19 NOTE — Assessment & Plan Note (Addendum)
Chronic.  Last A1c in February 2021 was 6.1%.  She is still taking Trulicity-we will continue this for now.  Hemoglobin A1c checked today along with urine microalbumin.  Patient is due for foot and eye exam-will address at upcoming visit.  Follow-up pending lab work.

## 2021-03-19 NOTE — Assessment & Plan Note (Signed)
TSH in February 2021 was normal, however given fatigue will recheck TSH today along with blood counts.  Previously, TSH was low indicating hyperthyroidism.  Patient does not have symptoms of hyperthyroidism today.

## 2021-03-19 NOTE — Assessment & Plan Note (Signed)
Previously controlled with diet.  Will check cholesterol levels today-patient is fasting.  Goal LDL is less than 100.

## 2021-03-19 NOTE — Assessment & Plan Note (Signed)
Chronic.  Will place referral to psychology today for counseling per patient request.

## 2021-03-20 LAB — CBC WITH DIFFERENTIAL/PLATELET
Absolute Monocytes: 1179 cells/uL — ABNORMAL HIGH (ref 200–950)
Basophils Absolute: 57 cells/uL (ref 0–200)
Basophils Relative: 0.4 %
Eosinophils Absolute: 71 cells/uL (ref 15–500)
Eosinophils Relative: 0.5 %
HCT: 38 % (ref 35.0–45.0)
Hemoglobin: 12.2 g/dL (ref 11.7–15.5)
Lymphs Abs: 2812 cells/uL (ref 850–3900)
MCH: 32.5 pg (ref 27.0–33.0)
MCHC: 32.1 g/dL (ref 32.0–36.0)
MCV: 101.3 fL — ABNORMAL HIGH (ref 80.0–100.0)
MPV: 11.1 fL (ref 7.5–12.5)
Monocytes Relative: 8.3 %
Neutro Abs: 10082 cells/uL — ABNORMAL HIGH (ref 1500–7800)
Neutrophils Relative %: 71 %
Platelets: 306 10*3/uL (ref 140–400)
RBC: 3.75 10*6/uL — ABNORMAL LOW (ref 3.80–5.10)
RDW: 14.5 % (ref 11.0–15.0)
Total Lymphocyte: 19.8 %
WBC: 14.2 10*3/uL — ABNORMAL HIGH (ref 3.8–10.8)

## 2021-03-20 LAB — TSH: TSH: 0.71 mIU/L

## 2021-03-20 LAB — COMPLETE METABOLIC PANEL WITH GFR
AG Ratio: 1.5 (calc) (ref 1.0–2.5)
ALT: 61 U/L — ABNORMAL HIGH (ref 6–29)
AST: 28 U/L (ref 10–35)
Albumin: 4 g/dL (ref 3.6–5.1)
Alkaline phosphatase (APISO): 95 U/L (ref 31–125)
BUN: 20 mg/dL (ref 7–25)
CO2: 28 mmol/L (ref 20–32)
Calcium: 9 mg/dL (ref 8.6–10.2)
Chloride: 105 mmol/L (ref 98–110)
Creat: 0.86 mg/dL (ref 0.50–1.10)
GFR, Est African American: 92 mL/min/{1.73_m2} (ref >=60)
GFR, Est Non African American: 79 mL/min/{1.73_m2} (ref >=60)
Globulin: 2.6 g/dL (calc) (ref 1.9–3.7)
Glucose, Bld: 71 mg/dL (ref 65–99)
Potassium: 4.6 mmol/L (ref 3.5–5.3)
Sodium: 140 mmol/L (ref 135–146)
Total Bilirubin: 0.7 mg/dL (ref 0.2–1.2)
Total Protein: 6.6 g/dL (ref 6.1–8.1)

## 2021-03-20 LAB — LIPID PANEL
Cholesterol: 210 mg/dL — ABNORMAL HIGH (ref <200)
HDL: 67 mg/dL (ref >=50)
LDL Cholesterol (Calc): 126 mg/dL (calc) — ABNORMAL HIGH
Non-HDL Cholesterol (Calc): 143 mg/dL (calc) — ABNORMAL HIGH (ref <130)
Total CHOL/HDL Ratio: 3.1 (calc) (ref <5.0)
Triglycerides: 78 mg/dL (ref <150)

## 2021-03-20 LAB — HEMOGLOBIN A1C
Hgb A1c MFr Bld: 6.2 % of total Hgb — ABNORMAL HIGH (ref <5.7)
Mean Plasma Glucose: 131 mg/dL
eAG (mmol/L): 7.3 mmol/L

## 2021-03-20 LAB — VITAMIN B12: Vitamin B-12: 282 pg/mL (ref 200–1100)

## 2021-03-20 LAB — VITAMIN D 25 HYDROXY (VIT D DEFICIENCY, FRACTURES): Vit D, 25-Hydroxy: 14 ng/mL — ABNORMAL LOW (ref 30–100)

## 2021-03-20 LAB — MICROALBUMIN / CREATININE URINE RATIO
Creatinine, Urine: 97 mg/dL (ref 20–275)
Microalb Creat Ratio: 2 mcg/mg creat (ref <30)
Microalb, Ur: 0.2 mg/dL

## 2021-03-21 ENCOUNTER — Other Ambulatory Visit: Payer: Self-pay | Admitting: Nurse Practitioner

## 2021-03-21 DIAGNOSIS — E559 Vitamin D deficiency, unspecified: Secondary | ICD-10-CM

## 2021-03-21 DIAGNOSIS — Z9884 Bariatric surgery status: Secondary | ICD-10-CM

## 2021-03-21 MED ORDER — VITAMIN D3 250 MCG (10000 UT) PO CAPS: 10000.0000 [IU] | ORAL_CAPSULE | Freq: Every day | ORAL | 0 refills | Status: DC

## 2021-04-01 ENCOUNTER — Other Ambulatory Visit: Payer: Self-pay | Admitting: Family Medicine

## 2021-04-01 NOTE — Telephone Encounter (Signed)
Ok to refill??  Last office visit 03/21/2021.  Last refill 02/24/2021.

## 2021-04-04 ENCOUNTER — Encounter: Payer: Self-pay | Admitting: Nurse Practitioner

## 2021-04-04 MED ORDER — ZOLPIDEM TARTRATE 10 MG PO TABS: 10.0000 mg | ORAL_TABLET | Freq: Every evening | ORAL | 0 refills | Status: DC | PRN

## 2021-04-04 NOTE — Telephone Encounter (Signed)
PDMP reviewed and appropriate

## 2021-04-04 NOTE — Telephone Encounter (Signed)
Ok to refill??  Last office visit 03/19/2021.  Last refill 02/24/2021. 

## 2021-05-01 ENCOUNTER — Other Ambulatory Visit: Payer: Self-pay

## 2021-05-01 ENCOUNTER — Ambulatory Visit (INDEPENDENT_AMBULATORY_CARE_PROVIDER_SITE_OTHER): Payer: No Typology Code available for payment source | Admitting: Nurse Practitioner

## 2021-05-01 ENCOUNTER — Encounter: Payer: Self-pay | Admitting: Nurse Practitioner

## 2021-05-01 VITALS — BP 130/82 | HR 75 | Temp 97.5°F | Ht 64.0 in | Wt 245.6 lb

## 2021-05-01 DIAGNOSIS — G8929 Other chronic pain: Secondary | ICD-10-CM

## 2021-05-01 DIAGNOSIS — M5442 Lumbago with sciatica, left side: Secondary | ICD-10-CM | POA: Diagnosis not present

## 2021-05-01 DIAGNOSIS — M51369 Other intervertebral disc degeneration, lumbar region without mention of lumbar back pain or lower extremity pain: Secondary | ICD-10-CM

## 2021-05-01 DIAGNOSIS — Z79899 Other long term (current) drug therapy: Secondary | ICD-10-CM | POA: Insufficient documentation

## 2021-05-01 DIAGNOSIS — D72829 Elevated white blood cell count, unspecified: Secondary | ICD-10-CM

## 2021-05-01 DIAGNOSIS — F411 Generalized anxiety disorder: Secondary | ICD-10-CM

## 2021-05-01 DIAGNOSIS — F5101 Primary insomnia: Secondary | ICD-10-CM

## 2021-05-01 DIAGNOSIS — K649 Unspecified hemorrhoids: Secondary | ICD-10-CM

## 2021-05-01 DIAGNOSIS — E119 Type 2 diabetes mellitus without complications: Secondary | ICD-10-CM | POA: Diagnosis not present

## 2021-05-01 DIAGNOSIS — E538 Deficiency of other specified B group vitamins: Secondary | ICD-10-CM

## 2021-05-01 DIAGNOSIS — M5136 Other intervertebral disc degeneration, lumbar region: Secondary | ICD-10-CM

## 2021-05-01 DIAGNOSIS — F410 Panic disorder [episodic paroxysmal anxiety] without agoraphobia: Secondary | ICD-10-CM

## 2021-05-01 DIAGNOSIS — M5126 Other intervertebral disc displacement, lumbar region: Secondary | ICD-10-CM

## 2021-05-01 DIAGNOSIS — K625 Hemorrhage of anus and rectum: Secondary | ICD-10-CM

## 2021-05-01 MED ORDER — TIZANIDINE HCL 2 MG PO CAPS: 2.0000 mg | ORAL_CAPSULE | Freq: Three times a day (TID) | ORAL | 0 refills | Status: DC | PRN

## 2021-05-01 MED ORDER — GLUCOSE BLOOD VI STRP
ORAL_STRIP | 11 refills | Status: DC
Start: 2021-05-01 — End: 2024-07-03

## 2021-05-01 MED ORDER — BUSPIRONE HCL 7.5 MG PO TABS: 7.5000 mg | ORAL_TABLET | Freq: Two times a day (BID) | ORAL | 2 refills | Status: DC

## 2021-05-01 MED ORDER — LANCETS MISC: 11 refills | Status: DC

## 2021-05-01 MED ORDER — PREGABALIN 300 MG PO CAPS: 300.0000 mg | ORAL_CAPSULE | Freq: Two times a day (BID) | ORAL | 2 refills | Status: DC

## 2021-05-01 MED ORDER — BLOOD GLUCOSE SYSTEM PAK KIT: PACK | 1 refills | Status: DC

## 2021-05-01 MED ORDER — DIAZEPAM 5 MG PO TABS: 2.5000 mg | ORAL_TABLET | Freq: Every day | ORAL | 0 refills | Status: DC | PRN

## 2021-05-01 MED ORDER — ZOLPIDEM TARTRATE 10 MG PO TABS: 10.0000 mg | ORAL_TABLET | Freq: Every evening | ORAL | 0 refills | Status: DC | PRN

## 2021-05-01 MED ORDER — DULOXETINE HCL 30 MG PO CPEP: 30.0000 mg | ORAL_CAPSULE | Freq: Every day | ORAL | 1 refills | Status: DC

## 2021-05-01 NOTE — Assessment & Plan Note (Signed)
Given ongoing rectal bleeding with hemorrhoids, will place referral to GI in [place] per patient request for further management.

## 2021-05-01 NOTE — Assessment & Plan Note (Signed)
Chronic.  Likely multifactorial-increase stress at home, significant back pain.  We are planning to switch Lexapro to duloxetine with help of helping with some stress and pain.  She is also going to start seeing a Social worker.  Encouraged patient to cut Ambien in half to make 5 mg and take daily as needed PDMP reviewed and appropriate.  Strongly encouraged patient not to take nightly as this medication can become habit-forming.  Controlled substance agreement was signed today and refill was given.  Refill should last 3 months-follow-up in 3 months.

## 2021-05-01 NOTE — Patient Instructions (Addendum)
Nice seeing you today.    Call and make appointment with Therapist right after visit today.  :)  Cut Lexapro in 1/2 and take 10 mg for next week.  After 1 week, start duloxetine 30 mg daily with lexapro 10 mg.  After 1 week, stop Lexapro completely and continue on duloxetine.  We will see you in 1 month.  Let me know with any issues with this.    Marland Kitchen of family medicine (9th ed., pp. [phone]). [place], [place]: [employer].">  Stress, Adult Stress is a normal reaction to life events. Stress is what you feel when life demands more than you are used to, or more than you think you can handle. Some stress can be useful, such as studying for a test or meeting a deadline at work. Stress that occurs too often or for too long can cause problems. It can affect your emotional health and interfere with relationships and normal daily activities. Too much stress can weaken your body's defense system (immune system) and increase your risk for physical illness. If you already have a medical problem, stress can make it worse. What are the causes? All sorts of life events can cause stress. An event that causes stress for one person may not be stressful for another person. Major life events, whether positive or negative, commonly cause stress. Examples include:  Losing a job or starting a new job.  Losing a loved one.  Moving to a new town or home.  Getting married or divorced.  Having a baby.  Getting injured or sick. Less obvious life events can also cause stress, especially if they occur day after day or in combination with each other. Examples include:  Working long hours.  Driving in traffic.  Caring for children.  Being in debt.  Being in a difficult relationship. What are the signs or symptoms? Stress can cause emotional symptoms, including:  Anxiety. This is feeling worried, afraid, on edge, overwhelmed, or out of control.  Anger, including irritation or  impatience.  Depression. This is feeling sad, down, helpless, or guilty.  Trouble focusing, remembering, or making decisions. Stress can cause physical symptoms, including:  Aches and pains. These may affect your head, neck, back, stomach, or other areas of your body.  Tight muscles or a clenched jaw.  Low energy.  Trouble sleeping. Stress can cause unhealthy behaviors, including:  Eating to feel better (overeating) or skipping meals.  Working too much or putting off tasks.  Smoking, drinking alcohol, or using drugs to feel better. How is this diagnosed? Stress is diagnosed through an assessment by your health care provider. He or she may diagnose this condition based on:  Your symptoms and any stressful life events.  Your medical history.  Tests to rule out other causes of your symptoms. Depending on your condition, your health care provider may refer you to a specialist for further evaluation. How is this treated? Stress management techniques are the recommended treatment for stress. Medicine is not typically recommended for the treatment of stress. Techniques to reduce your reaction to stressful life events include:  Stress identification. Monitor yourself for symptoms of stress and identify what causes stress for you. These skills may help you to avoid or prepare for stressful events.  Time management. Set your priorities, keep a calendar of events, and learn to say no. Taking these actions can help you avoid making too many commitments. Techniques for coping with stress include:  Rethinking the problem. Try to think realistically about  stressful events rather than ignoring them or overreacting. Try to find the positives in a stressful situation rather than focusing on the negatives.  Exercise. Physical exercise can release both physical and emotional tension. The key is to find a form of exercise that you enjoy and do it regularly.  Relaxation techniques. These relax  the body and mind. The key is to find one or more that you enjoy and use the techniques regularly. Examples include: ? Meditation, deep breathing, or progressive relaxation techniques. ? Yoga or tai chi. ? Biofeedback, mindfulness techniques, or journaling. ? Listening to music, being out in nature, or participating in other hobbies.  Practicing a healthy lifestyle. Eat a balanced diet, drink plenty of water, limit or avoid caffeine, and get plenty of sleep.  Having a strong support network. Spend time with family, friends, or other people you enjoy being around. Express your feelings and talk things over with someone you trust. Counseling or talk therapy with a mental health professional may be helpful if you are having trouble managing stress on your own.   Follow these instructions at home: Lifestyle  Avoid drugs.  Do not use any products that contain nicotine or tobacco, such as cigarettes, e-cigarettes, and chewing tobacco. If you need help quitting, ask your health care provider.  Limit alcohol intake to no more than 1 drink a day for nonpregnant women and 2 drinks a day for men. One drink equals 12 oz of beer, 5 oz of wine, or 1 oz of hard liquor  Do not use alcohol or drugs to relax.  Eat a balanced diet that includes fresh fruits and vegetables, whole grains, lean meats, fish, eggs, and beans, and low-fat dairy. Avoid processed foods and foods high in added fat, sugar, and salt.  Exercise at least 30 minutes on 5 or more days each week.  Get 7-8 hours of sleep each night.   General instructions  Practice stress management techniques as discussed with your health care provider.  Drink enough fluid to keep your urine clear or pale yellow.  Take over-the-counter and prescription medicines only as told by your health care provider.  Keep all follow-up visits as told by your health care provider. This is important.   Contact a health care provider if:  Your symptoms get  worse.  You have new symptoms.  You feel overwhelmed by your problems and can no longer manage them on your own. Get help right away if:  You have thoughts of hurting yourself or others. If you ever feel like you may hurt yourself or others, or have thoughts about taking your own life, get help right away. You can go to your nearest emergency department or call:  Your local emergency services (911 in the [place]  A suicide crisis helpline, such as the [place] at 386-143-3758. This is open 24 hours a day. Summary  Stress is a normal reaction to life events. It can cause problems if it happens too often or for too long.  Practicing stress management techniques is the best way to treat stress.  Counseling or talk therapy with a mental health professional may be helpful if you are having trouble managing stress on your own. This information is not intended to replace advice given to you by your health care provider. Make sure you discuss any questions you have with your health care provider. Document Revised: 08/30/2020 Document Reviewed: 08/30/2020 Elsevier Patient Education  2021 [employer].

## 2021-05-01 NOTE — Assessment & Plan Note (Signed)
Chronic.  Follows closely with orthopedic surgeon-due for back injection in the next week or so.  Currently taking it on as needed-maybe 1-2 times daily when she is not working.  She is taking Lyrica 300 mg twice daily every day.  Since pain is uncontrolled with current regimen, will also start on tizanidine 2 mg nightly as needed to help relax muscles.  Strongly encouraged patient to not take at the same time as hydrocodone, Valium, Lyrica, or Ambien.  She does not take Valium and Ambien daily.  PDMP reviewed and refill of Lyrica given today.  Controlled substance agreement was signed today.  Encourage close follow-up with orthopedic surgery- will defer further work-up and management to them. 

## 2021-05-01 NOTE — Assessment & Plan Note (Signed)
Chronic.  Previously well controlled on Lexapro 20 mg daily.  However, given increased use of Valium and ongoing pain in low back, will switch to duloxetine 30 mg daily.  Discussed with patient to taper Lexapro while starting duloxetine and then stop Lexapro.  Plan is to cut Lexapro in half and take 10 mg daily for 7 days.  On day 8, start duloxetine 30 mg along with Lexapro 10 mg.  Starting day 15, stop Lexapro completely.  Patient is also planning to start talking with a counselor in the next couple weeks.  Follow-up in 1 month to see how this new regimen is going.

## 2021-05-01 NOTE — Assessment & Plan Note (Addendum)
Chronic.  Follows closely with orthopedic surgeon-due for back injection in the next week or so.  Currently taking it on as needed-maybe 1-2 times daily when she is not working.  She is taking Lyrica 300 mg twice daily every day.  Since pain is uncontrolled with current regimen, will also start on tizanidine 2 mg nightly as needed to help relax muscles.  Strongly encouraged patient to not take at the same time as hydrocodone, Valium, Lyrica, or Ambien.  She does not take Valium and Ambien daily.  PDMP reviewed and refill of Lyrica given today.  Controlled substance agreement was signed today.  Encourage close follow-up with orthopedic surgery- will defer further work-up and management to them.

## 2021-05-01 NOTE — Progress Notes (Signed)
Subjective:    Patient ID: Sabrina Roberts, female    DOB: 10/14/1971, 50 y.o.   MRN: [id number]  HPI: Sabrina Roberts is a 50 y.o. female presenting for worsening chronic back pain.  Chief Complaint  Patient presents with  . Back Pain    Receives inj in the back for pain, last inj 5 weeks ago. Pain is starting to get worse shooting down left leg and left foot. Taking aleve for pain during the day and hydrocodone at night    BACK PAIN Patient follows with Dr. Bethel Born.  She gets back injections every 6 weeks.  Last week, she was visiting family and helping family move in [place] and reports since then, her back pain has worsened.  She reports her back pain is in her left lower back and radiates as sharp shooting pain all the way down to her at the bottom of her foot.  She feels like there is a small rubber band popping in her back with certain movements.  She is interested in having another MRI and evaluating for if surgery is indicated because of how severe her back pain is getting.  She was working really hard on exercise regimen to help lose weight, but has been unable to do so due to her pain worsening.  Duration: chronic Mechanism of injury: Unknown Location: L>R Onset: gradual Severity: Moderate to severe Quality: Electric Frequency: Constant Radiation: L leg below the knee Aggravating factors: Certain movements, standing for too long, picking up heavy objects Alleviating factors: Aleve, Tylenol, hydrocodone, Lyrica Status: Worsening Treatments attemtted: Aleve, Tylenol, hydrocodone, Lyrica Relief with NSAIDs?: mild Nighttime pain:  no Paresthesias / decreased sensation:  yes Bowel / bladder incontinence:  no Fevers:  no Dysuria / urinary frequency:  no   Patient is requesting refill of Lyrica today.  INSOMNIA  Patient is requesting refill of Ambien today.  She reports she uses Ambien 3-4 times per week to help her sleep.  She does not take this  medication if she is going to bed after 10 PM because it makes her feel groggy the next day. Duration: chronic Satisfied with sleep quality: no Difficulty falling asleep: yes Difficulty staying asleep: yes Waking a few hours after sleep onset: yes Early morning awakenings: yes Daytime hypersomnolence: yes Wakes feeling refreshed: no Good sleep hygiene: no Apnea: yes Snoring: yes Depressed/anxious mood: yes Recent stress: yes Restless legs/nocturnal leg cramps: no Chronic pain/arthritis: yes History of sleep study: no Treatments attempted: Ambien 10 mg  ANXIETY/STRESS Patient is currently taking Lexapro 20 mg daily, BuSpar 7.5 mg twice daily, and Valium 2-4 times per week as needed for panic.  She feels her symptoms are well controlled on this regimen. Duration: Chronic Anxious mood: yes  Excessive worrying: yes Irritability: no  Sweating: no Nausea: no Palpitations:no Hyperventilation: no Panic attacks: yes Agoraphobia: no  Obscessions/compulsions: no Weight changes: yes Insomnia: yes Hard to fall and stay asleep  Hypersomnia: no Fatigue/loss of energy: yes; improved Feelings of worthlessness: no Feelings of guilt: no Impaired concentration/indecisiveness: no Recent Stressors/Life Changes: yes   Relationship problems: no   Family stress: yes     Financial stress: no    Job stress: yes    Recent death/loss: no  LEUKOCYTOSIS Duration: ~6 weeks Circumstances with development: none known, no s/s infection Swollen lymph nodes: no Fevers: no Night sweats: no New joint pain: no  DIABETES Diabetes is currently diet controlled.  Patient is requesting new blood sugar meter with strips  and lancets be sent to her pharmacy so she can monitor her blood sugar closely.  She does not want her A1c to increase-reports in the past was more than 14.    Allergies  Allergen Reactions  . Actos [Pioglitazone Hydrochloride] Anaphylaxis, Hives and Other (See Comments)    Chest pain    . Latex Rash    Outpatient Encounter Medications as of 05/01/2021  Medication Sig  . Blood Glucose Monitoring Suppl (BLOOD GLUCOSE SYSTEM PAK) KIT Please dispense based on patient and insurance preference.  Use as directed to monitor FSBS 1-2 times daily.  Dx E11.9  . Cholecalciferol (VITAMIN D3) 250 MCG (10000 UT) capsule Take 1 capsule (10,000 Units total) by mouth daily.  . Dulaglutide (TRULICITY) 1.5 SN/0.5LZ SOPN Inject 0.5 mLs (1.5 mg total) into the skin once a week.  . DULoxetine (CYMBALTA) 30 MG capsule Take 1 capsule (30 mg total) by mouth daily.  Marland Kitchen escitalopram (LEXAPRO) 20 MG tablet Take 1 tablet by mouth once daily  . glucose blood test strip Please dispense based on patient and insurance preference.  Use as directed to monitor FSBS 1-2 times daily.  Dx E11.9  . HYDROcodone-acetaminophen (NORCO) 5-325 MG tablet Take 1 tablet by mouth every 6 (six) hours as needed for moderate pain.  . Lancets MISC Please dispense based on patient and insurance preference.  Use as directed to monitor FSBS 1-2 times daily.  Dx E11.9  . Multiple Vitamin (MULTIVITAMIN) tablet Take 1 tablet by mouth daily.   . tizanidine (ZANAFLEX) 2 MG capsule Take 1 capsule (2 mg total) by mouth 3 (three) times daily as needed for muscle spasms. Take first dose at night time and monitor for drowsiness.  Do not take while driving or operating heavy machinery if this medication makes you drowsy.  . [DISCONTINUED] busPIRone (BUSPAR) 7.5 MG tablet Take 1 tablet by mouth twice daily  . [DISCONTINUED] diazepam (VALIUM) 5 MG tablet TAKE 1 TABLET BY MOUTH EVERY 12 HOURS AS NEEDED FOR ANXIETY  . [DISCONTINUED] pregabalin (LYRICA) 300 MG capsule Take 1 capsule by mouth twice daily  . [DISCONTINUED] zolpidem (AMBIEN) 10 MG tablet Take 1 tablet (10 mg total) by mouth at bedtime as needed. Please schedule f/u with PCP for further refills  . busPIRone (BUSPAR) 7.5 MG tablet Take 1 tablet (7.5 mg total) by mouth 2 (two) times daily.   . diazepam (VALIUM) 5 MG tablet Take 0.5 tablets (2.5 mg total) by mouth daily as needed for anxiety.  . pregabalin (LYRICA) 300 MG capsule Take 1 capsule (300 mg total) by mouth 2 (two) times daily.  Marland Kitchen zolpidem (AMBIEN) 10 MG tablet Take 1 tablet (10 mg total) by mouth at bedtime as needed.   Facility-Administered Encounter Medications as of 05/01/2021  Medication  . cyanocobalamin ((VITAMIN B-12)) injection 1,000 mcg    Patient Active Problem List   Diagnosis Date Noted  . Controlled substance agreement signed 05/01/2021  . Panic attacks 09/10/2020  . GAD (generalized anxiety disorder) 05/03/2020  . RLS (restless legs syndrome) 02/07/2020  . Abnormal TSH 11/15/2018  . Rectal bleeding 11/04/2017  . Diarrhea 11/04/2017  . Thoracic disc herniation 09/06/2017  . Chronic back pain 09/06/2017  . Anemia 09/06/2017  . B12 deficiency 06/08/2017  . Vitamin D deficiency 06/08/2017  . MDD (major depressive disorder) 06/08/2017  . Bulging of lumbar intervertebral disc 06/08/2017  . Carpal tunnel syndrome of right wrist   . Borderline abnormal TFTs 10/29/2014  . Irregular menses 08/01/2013  . Insomnia 10/14/2012  .  DM (diabetes mellitus) ([place]) 02/28/2012  . Hyperlipidemia 02/28/2012  . Class 3 obesity 02/28/2012  . Thyroid nodule 02/28/2012    Past Medical History:  Diagnosis Date  . Anemia   . Anxiety   . Arthritis   . Depression   . Diabetes mellitus   . Hyperlipidemia   . Hypertension   . Obesity   . Sleep apnea    Has CPAP but has lost weight and hasnt used    Relevant past medical, surgical, family and social history reviewed and updated as indicated. Interim medical history since our last visit reviewed.  Review of Systems Per HPI unless specifically indicated above     Objective:    BP 130/82   Pulse 75   Temp (!) 97.5 F (36.4 C)   Ht '5\' 4"'  (1.626 m)   Wt 245 lb 9.6 oz (111.4 kg)   LMP  (LMP Unknown)   SpO2 98%   BMI 42.16 kg/m   Wt Readings from Last 3  Encounters:  05/01/21 245 lb 9.6 oz (111.4 kg)  03/19/21 234 lb 12.8 oz (106.5 kg)  05/03/20 252 lb (114.3 kg)    Physical Exam Vitals and nursing note reviewed.  Constitutional:      General: She is not in acute distress.    Appearance: Normal appearance. She is not toxic-appearing.     Comments: Uncomfortable appearing  HENT:     Head: Normocephalic and atraumatic.  Eyes:     General: No scleral icterus.    Extraocular Movements: Extraocular movements intact.  Cardiovascular:     Rate and Rhythm: Normal rate.  Pulmonary:     Effort: Pulmonary effort is normal. No respiratory distress.  Musculoskeletal:        General: Normal range of motion.     Right lower leg: No edema.     Left lower leg: No edema.  Skin:    Capillary Refill: Capillary refill takes less than 2 seconds.     Coloration: Skin is not jaundiced or pale.     Findings: No erythema.  Neurological:     Mental Status: She is alert and oriented to person, place, and time.     Motor: No weakness.     Gait: Gait normal.  Psychiatric:        Mood and Affect: Mood normal.        Behavior: Behavior normal.        Thought Content: Thought content normal.        Judgment: Judgment normal.    Results for orders placed or performed in visit on 03/19/21  Lipid panel  Result Value Ref Range   Cholesterol 210 (H) <200 mg/dL   HDL 67 > OR = 50 mg/dL   Triglycerides 78 <150 mg/dL   LDL Cholesterol (Calc) 126 (H) mg/dL (calc)   Total CHOL/HDL Ratio 3.1 <5.0 (calc)   Non-HDL Cholesterol (Calc) 143 (H) <130 mg/dL (calc)  Hemoglobin A1c  Result Value Ref Range   Hgb A1c MFr Bld 6.2 (H) <5.7 % of total Hgb   Mean Plasma Glucose 131 mg/dL   eAG (mmol/L) 7.3 mmol/L  COMPLETE METABOLIC PANEL WITH GFR  Result Value Ref Range   Glucose, Bld 71 65 - 99 mg/dL   BUN 20 7 - 25 mg/dL   Creat 0.86 0.50 - 1.10 mg/dL   GFR, Est Non African American 79 > OR = 60 mL/min/1.56m   GFR, Est African American 92 > OR = 60 mL/min/1.76m  BUN/Creatinine Ratio NOT APPLICABLE 6 - 22 (calc)   Sodium 140 135 - 146 mmol/L   Potassium 4.6 3.5 - 5.3 mmol/L   Chloride 105 98 - 110 mmol/L   CO2 28 20 - 32 mmol/L   Calcium 9.0 8.6 - 10.2 mg/dL   Total Protein 6.6 6.1 - 8.1 g/dL   Albumin 4.0 3.6 - 5.1 g/dL   Globulin 2.6 1.9 - 3.7 g/dL (calc)   AG Ratio 1.5 1.0 - 2.5 (calc)   Total Bilirubin 0.7 0.2 - 1.2 mg/dL   Alkaline phosphatase (APISO) 95 31 - 125 U/L   AST 28 10 - 35 U/L   ALT 61 (H) 6 - 29 U/L  TSH  Result Value Ref Range   TSH 0.71 mIU/L  CBC with Differential/Platelet  Result Value Ref Range   WBC 14.2 (H) 3.8 - 10.8 Thousand/uL   RBC 3.75 (L) 3.80 - 5.10 Million/uL   Hemoglobin 12.2 11.7 - 15.5 g/dL   HCT 38.0 35.0 - 45.0 %   MCV 101.3 (H) 80.0 - 100.0 fL   MCH 32.5 27.0 - 33.0 pg   MCHC 32.1 32.0 - 36.0 g/dL   RDW 14.5 11.0 - 15.0 %   Platelets 306 140 - 400 Thousand/uL   MPV 11.1 7.5 - 12.5 fL   Neutro Abs 10,082 (H) 1,500 - 7,800 cells/uL   Lymphs Abs 2,812 850 - 3,900 cells/uL   Absolute Monocytes 1,179 (H) 200 - 950 cells/uL   Eosinophils Absolute 71 15 - 500 cells/uL   Basophils Absolute 57 0 - 200 cells/uL   Neutrophils Relative % 71 %   Total Lymphocyte 19.8 %   Monocytes Relative 8.3 %   Eosinophils Relative 0.5 %   Basophils Relative 0.4 %  Vitamin B12  Result Value Ref Range   Vitamin B-12 282 200 - 1,100 pg/mL  VITAMIN D 25 Hydroxy (Vit-D Deficiency, Fractures)  Result Value Ref Range   Vit D, 25-Hydroxy 14 (L) 30 - 100 ng/mL  Urinalysis, Routine w reflex microscopic  Result Value Ref Range   Color, Urine YELLOW YELLOW   APPearance CLEAR CLEAR   Specific Gravity, Urine 1.025 1.001 - 1.03   pH 5.5 5.0 - 8.0   Glucose, UA NEGATIVE NEGATIVE   Bilirubin Urine NEGATIVE NEGATIVE   Ketones, ur NEGATIVE NEGATIVE   Hgb urine dipstick NEGATIVE NEGATIVE   Protein, ur NEGATIVE NEGATIVE   Nitrite NEGATIVE NEGATIVE   Leukocytes,Ua NEGATIVE NEGATIVE  Microalbumin/Creatinine Ratio, Urine   Result Value Ref Range   Creatinine, Urine 97 20 - 275 mg/dL   Microalb, Ur 0.2 mg/dL   Microalb Creat Ratio 2 <30 mcg/mg creat      Assessment & Plan:   Problem List Items Addressed This Visit      Digestive   Rectal bleeding    Given ongoing rectal bleeding with hemorrhoids, will place referral to GI in [place] per patient request for further management.        Endocrine   DM (diabetes mellitus) ([place])    Last A1c was 6.2% in March 2022.  Continue Trulicity for now.  We will also send in blood glucose meter with strips and lancets for patient to monitor blood sugar twice daily at home.  Follow-up in 2 months.      Relevant Medications   Blood Glucose Monitoring Suppl (BLOOD GLUCOSE SYSTEM PAK) KIT   Lancets MISC   glucose blood test strip     Musculoskeletal and Integument   Bulging of lumbar  intervertebral disc    Chronic.  Follows closely with orthopedic surgeon-due for back injection in the next week or so.  Currently taking it on as needed-maybe 1-2 times daily when she is not working.  She is taking Lyrica 300 mg twice daily every day.  Since pain is uncontrolled with current regimen, will also start on tizanidine 2 mg nightly as needed to help relax muscles.  Strongly encouraged patient to not take at the same time as hydrocodone, Valium, Lyrica, or Ambien.  She does not take Valium and Ambien daily.  PDMP reviewed and refill of Lyrica given today.  Controlled substance agreement was signed today.  Encourage close follow-up with orthopedic surgery- will defer further work-up and management to them.        Other   Panic attacks    Chronic.  Initially was started on Valium 5 mg as needed to help with panic attacks, however it sounds like patient is using a bit more frequently than previously intended.  Strongly encouraged patient to use very seldom-2 times per week at most.  Patient is under increased pressure at home with caring for her husband who is ill, caring for  him, and continuing a full-time job.  She plans to reach out to a counselor after this appointment.  PDMP reviewed and appropriate-we will send in refill of Valium 5 mg to use very sparingly.  This refill should last at least 3 months and patient was in agreement to this.  Controlled substance contract was signed today.      Relevant Medications   diazepam (VALIUM) 5 MG tablet   busPIRone (BUSPAR) 7.5 MG tablet   DULoxetine (CYMBALTA) 30 MG capsule   Insomnia    Chronic.  Likely multifactorial-increase stress at home, significant back pain.  We are planning to switch Lexapro to duloxetine with help of helping with some stress and pain.  She is also going to start seeing a Social worker.  Encouraged patient to cut Ambien in half to make 5 mg and take daily as needed PDMP reviewed and appropriate.  Strongly encouraged patient not to take nightly as this medication can become habit-forming.  Controlled substance agreement was signed today and refill was given.  Refill should last 3 months-follow-up in 3 months.      Relevant Medications   zolpidem (AMBIEN) 10 MG tablet   GAD (generalized anxiety disorder)    Chronic.  Previously well controlled on Lexapro 20 mg daily.  However, given increased use of Valium and ongoing pain in low back, will switch to duloxetine 30 mg daily.  Discussed with patient to taper Lexapro while starting duloxetine and then stop Lexapro.  Plan is to cut Lexapro in half and take 10 mg daily for 7 days.  On day 8, start duloxetine 30 mg along with Lexapro 10 mg.  Starting day 15, stop Lexapro completely.  Patient is also planning to start talking with a counselor in the next couple weeks.  Follow-up in 1 month to see how this new regimen is going.      Relevant Medications   diazepam (VALIUM) 5 MG tablet   busPIRone (BUSPAR) 7.5 MG tablet   DULoxetine (CYMBALTA) 30 MG capsule   Controlled substance agreement signed   Chronic back pain - Primary    Chronic.  Follows closely  with orthopedic surgeon-due for back injection in the next week or so.  Currently taking it on as needed-maybe 1-2 times daily when she is not working.  She is taking  Lyrica 300 mg twice daily every day.  Since pain is uncontrolled with current regimen, will also start on tizanidine 2 mg nightly as needed to help relax muscles.  Strongly encouraged patient to not take at the same time as hydrocodone, Valium, Lyrica, or Ambien.  She does not take Valium and Ambien daily.  PDMP reviewed and refill of Lyrica given today.  Controlled substance agreement was signed today.  Encourage close follow-up with orthopedic surgery- will defer further work-up and management to them.      Relevant Medications   pregabalin (LYRICA) 300 MG capsule   DULoxetine (CYMBALTA) 30 MG capsule   tizanidine (ZANAFLEX) 2 MG capsule    Other Visit Diagnoses    Leukocytosis, unspecified type       Noted 6 weeks ago on labs.  No s/s of infection today.  We will recheck CBC with inflammatory markers and peripheral smear.  Follow-up pending results.   Relevant Orders   CBC with Differential   Sedimentation Rate   C-reactive protein   Pathologist smear review   Hemorrhoids, unspecified hemorrhoid type       Relevant Orders   Ambulatory referral to Gastroenterology       Follow up plan: Return in about 4 weeks (around 05/29/2021) for mood follow up.

## 2021-05-01 NOTE — Assessment & Plan Note (Signed)
Last A1c was 6.2% in March 2022.  Continue Trulicity for now.  We will also send in blood glucose meter with strips and lancets for patient to monitor blood sugar twice daily at home.  Follow-up in 2 months.

## 2021-05-01 NOTE — Assessment & Plan Note (Signed)
Chronic.  Initially was started on Valium 5 mg as needed to help with panic attacks, however it sounds like patient is using a bit more frequently than previously intended.  Strongly encouraged patient to use very seldom-2 times per week at most.  Patient is under increased pressure at home with caring for her husband who is ill, caring for him, and continuing a full-time job.  She plans to reach out to a counselor after this appointment.  PDMP reviewed and appropriate-we will send in refill of Valium 5 mg to use very sparingly.  This refill should last at least 3 months and patient was in agreement to this.  Controlled substance contract was signed today.

## 2021-05-02 LAB — CBC WITH DIFFERENTIAL/PLATELET
Absolute Monocytes: 707 cells/uL (ref 200–950)
Basophils Absolute: 28 cells/uL (ref 0–200)
Basophils Relative: 0.3 %
Eosinophils Absolute: 121 cells/uL (ref 15–500)
Eosinophils Relative: 1.3 %
HCT: 35.6 % (ref 35.0–45.0)
Hemoglobin: 11.4 g/dL — ABNORMAL LOW (ref 11.7–15.5)
Lymphs Abs: 3181 cells/uL (ref 850–3900)
MCH: 32.8 pg (ref 27.0–33.0)
MCHC: 32 g/dL (ref 32.0–36.0)
MCV: 102.3 fL — ABNORMAL HIGH (ref 80.0–100.0)
MPV: 11.7 fL (ref 7.5–12.5)
Monocytes Relative: 7.6 %
Neutro Abs: 5264 cells/uL (ref 1500–7800)
Neutrophils Relative %: 56.6 %
Platelets: 291 10*3/uL (ref 140–400)
RBC: 3.48 10*6/uL — ABNORMAL LOW (ref 3.80–5.10)
RDW: 13 % (ref 11.0–15.0)
Total Lymphocyte: 34.2 %
WBC: 9.3 10*3/uL (ref 3.8–10.8)

## 2021-05-02 LAB — SEDIMENTATION RATE: Sed Rate: 28 mm/h — ABNORMAL HIGH (ref 0–20)

## 2021-05-02 LAB — C-REACTIVE PROTEIN: CRP: 0.6 mg/L (ref <8.0)

## 2021-05-02 LAB — PATHOLOGIST SMEAR REVIEW

## 2021-05-05 ENCOUNTER — Ambulatory Visit: Payer: 59 | Admitting: Gastroenterology

## 2021-05-11 IMAGING — DX DG LUMBAR SPINE COMPLETE 4+V
5 series · 5 of 5 positions shown · non-contrast
Comparison: July 11, 2018.

CLINICAL DATA: Acute low back pain after fall.

EXAM:
LUMBAR SPINE - COMPLETE 4+ VIEW

[l-spine ap]
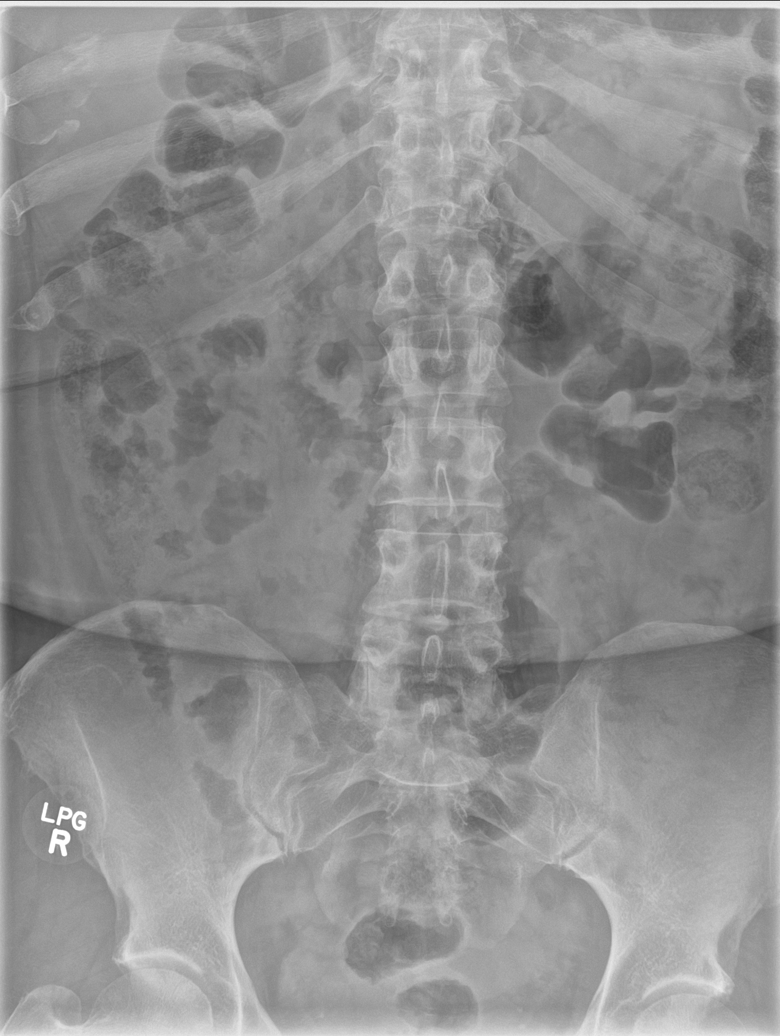

[l-spine obl (1 of 2)]
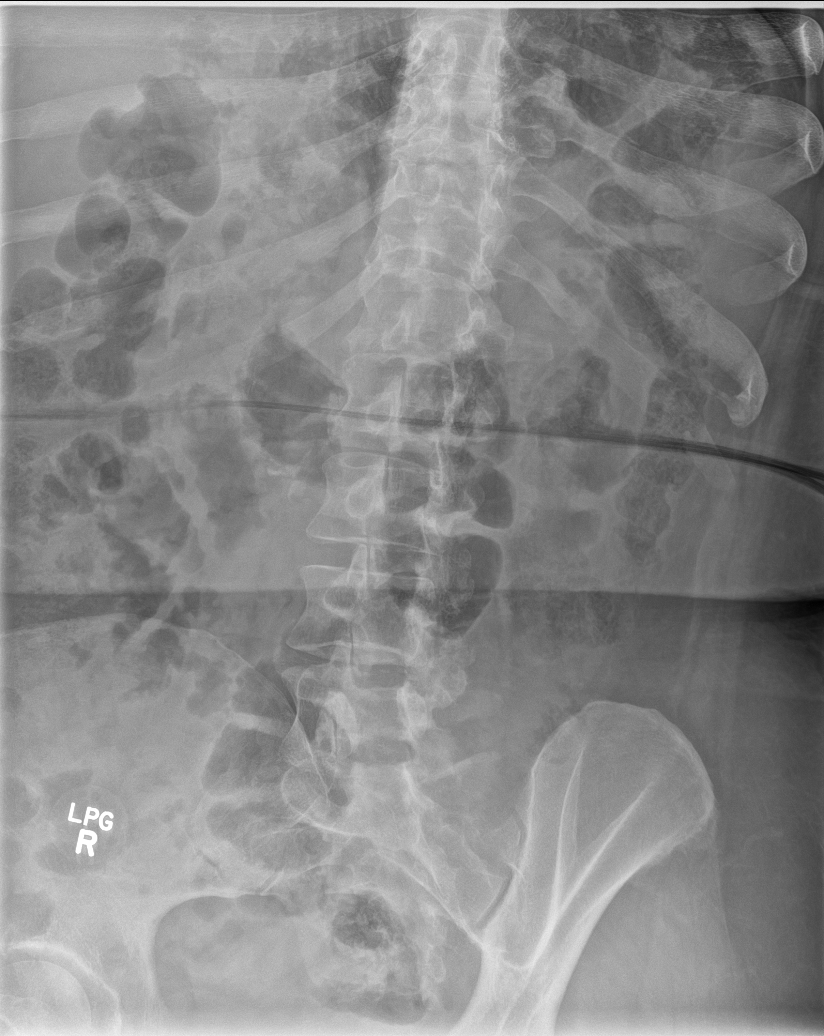

[l-spine obl (2 of 2)]
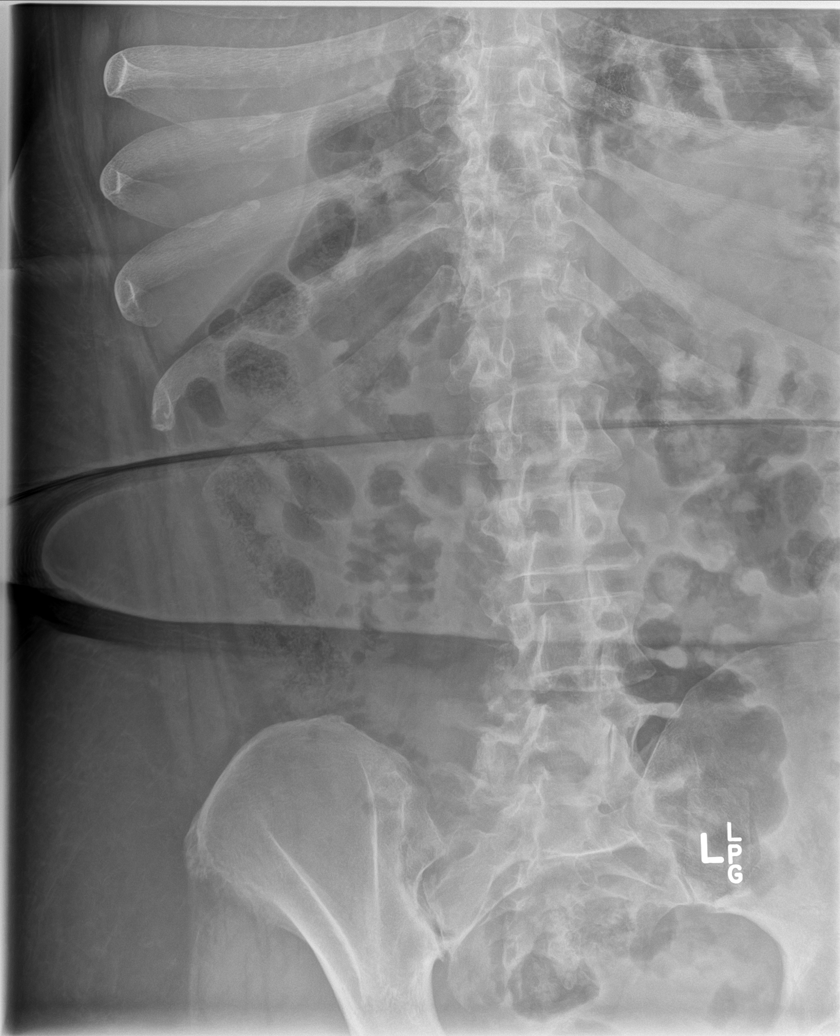

[l-spine lat]
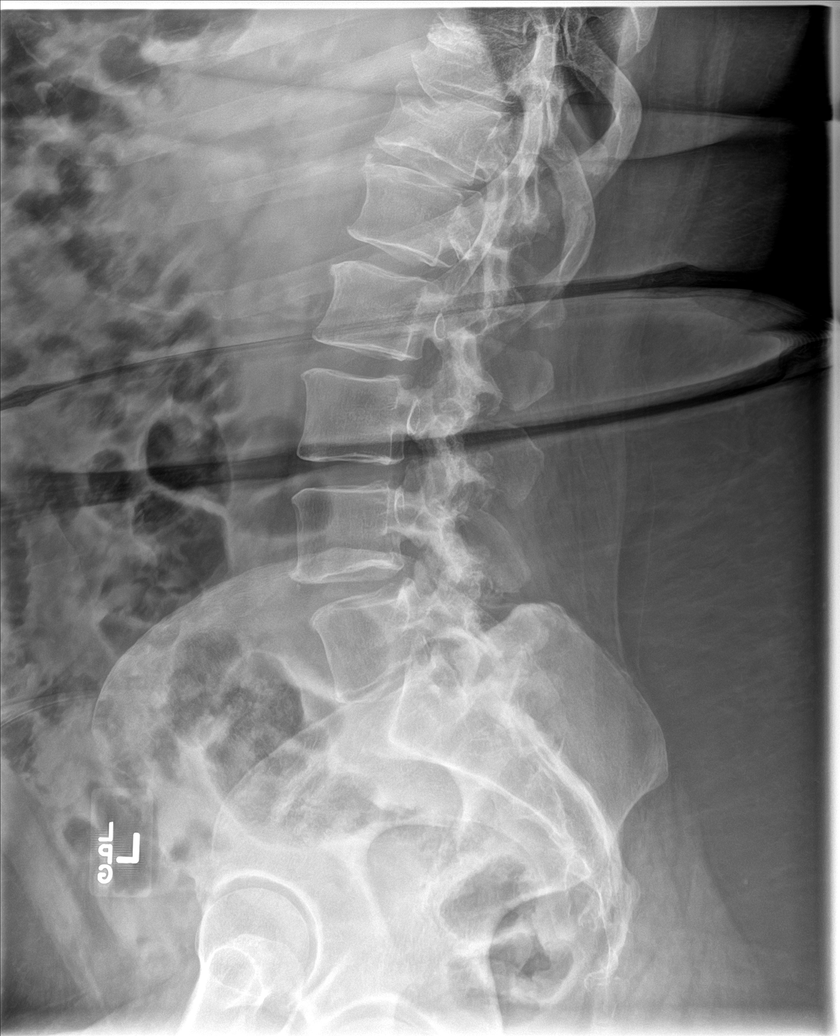

[l-spine spot]
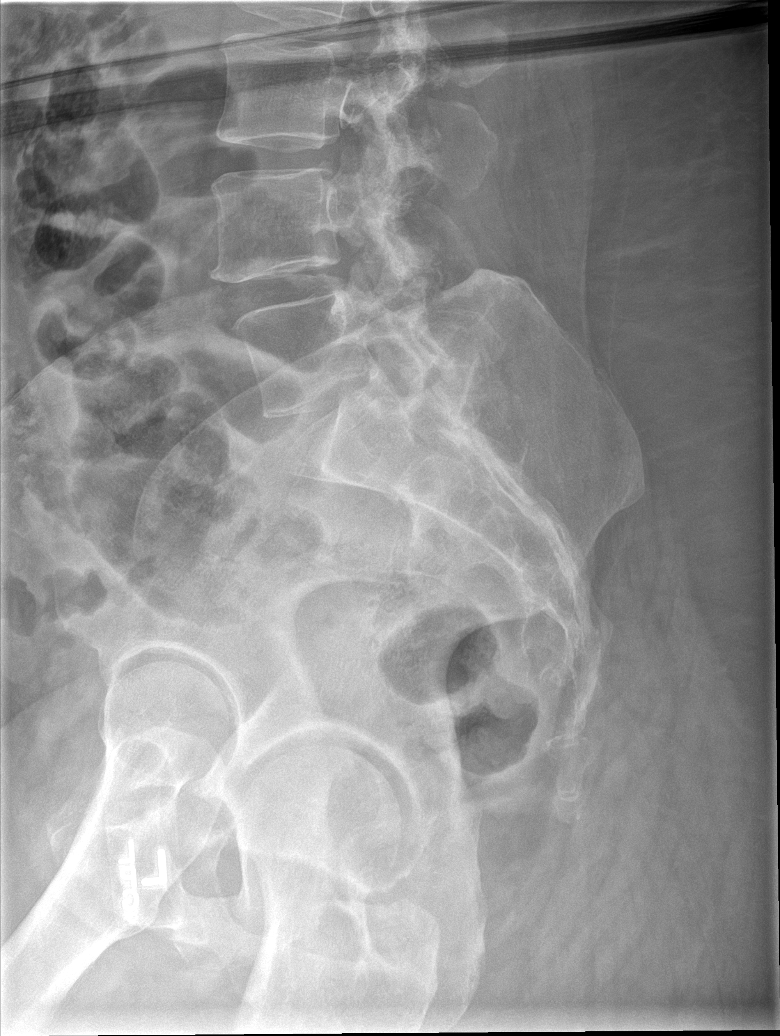

[5 of 5 positions shown; findings below may reference images not displayed]

FINDINGS: No fracture or spondylolisthesis is noted. Disc spaces are
well-maintained. Minimal anterior osteophyte formation is noted at
L1-2 and L4-5.
IMPRESSION: Minimal degenerative changes as described above. No acute
abnormality seen in the lumbar spine.

## 2021-05-19 ENCOUNTER — Encounter: Payer: Self-pay | Admitting: *Deleted

## 2021-05-22 ENCOUNTER — Ambulatory Visit (INDEPENDENT_AMBULATORY_CARE_PROVIDER_SITE_OTHER): Payer: No Typology Code available for payment source | Admitting: Pulmonary Disease

## 2021-05-22 ENCOUNTER — Other Ambulatory Visit: Payer: Self-pay

## 2021-05-22 ENCOUNTER — Encounter: Payer: Self-pay | Admitting: Pulmonary Disease

## 2021-05-22 DIAGNOSIS — G4733 Obstructive sleep apnea (adult) (pediatric): Secondary | ICD-10-CM

## 2021-05-22 DIAGNOSIS — G8929 Other chronic pain: Secondary | ICD-10-CM | POA: Diagnosis not present

## 2021-05-22 DIAGNOSIS — M545 Low back pain, unspecified: Secondary | ICD-10-CM

## 2021-05-22 HISTORY — DX: Obstructive sleep apnea (adult) (pediatric): G47.33

## 2021-05-22 NOTE — Assessment & Plan Note (Signed)
She had severe OSA when she weighed 311 pounds.  We will need to reassess severity of OSA with a home sleep test Given excessive daytime somnolence, narrow pharyngeal exam & loud snoring, obstructive sleep apnea is probable & an overnight polysomnogram will be scheduled as a split study. The pathophysiology of obstructive sleep apnea , it's cardiovascular consequences & modes of treatment including CPAP were discused with the patient in detail & they evidenced understanding.   She would be amenable to restart CPAP with nasal mask should we determine that she has moderate to severe OSA

## 2021-05-22 NOTE — Assessment & Plan Note (Signed)
I explained to her the danger of combining Ambien with narcotics, muscle relaxants.  She understands that on nights that she takes Zanaflex or hydrocodone, she should not be taking the Ambien

## 2021-05-22 NOTE — Progress Notes (Signed)
Subjective:    Patient ID: Sabrina Roberts, female    DOB: [birth year], 50 y.o.   MRN: [id number]  HPI  Sabrina Roberts is a 50 year old who presents to establish care for reassessment of OSA. She had morbid obesity and underwent gastric bypass in 2012.  She lost from her peak weight of 400 pounds to as low as 189 pounds but has slowly regained weight to her current weight of 245 .  OSA was diagnosed in 2006 when she weighed 311 pounds, this was severe and was corrected by CPAP of 10 cm with nasal mask.  She used this for many years until she lost significant weight and then stopped using her machine. Her husband has now noted loud snoring and she finds herself with excessive daytime tiredness and nonrefreshing sleep. Epworth sleepiness score is 10 and she reports sleepiness while sitting and reading, watching TV or sitting inactive in a public place. Bedtime is between 10 and 11 PM, sleep latency is prolonged and she takes Ambien occasionally.  More recently she has started Lyrica for leg pains and Zanaflex for chronic back pain, both of which make her sleepy and hence she has not needed to use Ambien much. She sleeps on her side with 2 pillows, reports 2-3 nocturnal awakenings and sometimes this can be a prolonged latency before going back to sleep, finally wakes up at 7 AM feeling tired with dryness of mouth. There is no history suggestive of cataplexy, sleep paralysis or parasomnias  PMH -depression, diabetes, chronic back pain   Significant tests/ events reviewed NPSG 07/2005 -weight 311 pounds AHI 69/hour lowest desaturation 79%, corrected by CPAP 10 cm nasal mask   Past Medical History:  Diagnosis Date  . Anemia   . Anxiety   . Arthritis   . Depression   . Diabetes mellitus   . Hyperlipidemia   . Hypertension   . Obesity   . Sleep apnea    Has CPAP but has lost weight and hasnt used   Past Surgical History:  Procedure Laterality Date  . CARPAL TUNNEL RELEASE Right 10/01/2016    Procedure: RIGHT CARPAL TUNNEL RELEASE;  Surgeon: Carole Civil, MD;  Location: [facility];  Service: Orthopedics;  Laterality: Right;  . CARPAL TUNNEL RELEASE Left 10/22/2016   Procedure: CARPAL TUNNEL RELEASE;  Surgeon: Carole Civil, MD;  Location: [facility];  Service: Orthopedics;  Laterality: Left;  . CESAREAN SECTION  2002  . CHOLECYSTECTOMY  dec 2012  . GASTRIC BYPASS  dec 2012  . KNEE ARTHROSCOPY Right    Allergies  Allergen Reactions  . Actos [Pioglitazone Hydrochloride] Anaphylaxis, Hives and Other (See Comments)    Chest pain   . Latex Rash    Social History   Socioeconomic History  . Marital status: Married    Spouse name: Not on file  . Number of children: Not on file  . Years of education: Not on file  . Highest education level: Not on file  Occupational History  . Not on file  Tobacco Use  . Smoking status: Never Smoker  . Smokeless tobacco: Never Used  Vaping Use  . Vaping Use: Never used  Substance and Sexual Activity  . Alcohol use: Yes    Alcohol/week: 0.0 standard drinks    Comment: occasionally  . Drug use: No  . Sexual activity: Yes    Partners: Male    Birth control/protection: None  Other Topics Concern  . Not on file  Social History Narrative  . Not  on file   Social Determinants of Health   Financial Resource Strain: Not on file  Food Insecurity: Not on file  Transportation Needs: Not on file  Physical Activity: Not on file  Stress: Not on file  Social Connections: Not on file  Intimate Partner Violence: Not on file     Family History  Problem Relation Age of Onset  . Hypertension Mother   . Depression Mother   . Diabetes Mother   . Irritable bowel syndrome Mother   . Heart disease Mother   . Stroke Mother   . Hypertension Father   . Hyperlipidemia Father   . Prostate cancer Father   . Colon polyps Father   . Heart disease Father   . Kidney Stones Father   . Diverticulitis Father   . Colon cancer Father   . Breast  cancer Paternal Grandmother   . Colon cancer Paternal Grandfather   . Diabetes Maternal Grandmother   . Breast cancer Maternal Aunt   . Stomach cancer Paternal Uncle   . Colon cancer Other        Paternal Great Uncles x 4  . Colon polyps Paternal Aunt        x 3     Review of Systems Constitutional: negative for anorexia, fevers and sweats  Eyes: negative for irritation, redness and visual disturbance  Ears, nose, mouth, throat, and face: negative for earaches, epistaxis, nasal congestion and sore throat  Respiratory: negative for cough, dyspnea on exertion, sputum and wheezing  Cardiovascular: negative for chest pain, dyspnea, lower extremity edema, orthopnea, palpitations and syncope  Gastrointestinal: negative for abdominal pain, constipation, diarrhea, melena, nausea and vomiting  Genitourinary:negative for dysuria, frequency and hematuria  Hematologic/lymphatic: negative for bleeding, easy bruising and lymphadenopathy  Musculoskeletal:negative for arthralgias, muscle weakness and stiff joints  Neurological: negative for coordination problems, gait problems, headaches and weakness  Endocrine: negative for diabetic symptoms including polydipsia, polyuria and weight loss     Objective:   Physical Exam  Gen. Pleasant, obese, in no distress, normal affect ENT - no pallor,icterus, no post nasal drip, class 2 airway Neck: No JVD, no thyromegaly, no carotid bruits Lungs: no use of accessory muscles, no dullness to percussion, decreased without rales or rhonchi  Cardiovascular: Rhythm regular, heart sounds  normal, no murmurs or gallops, no peripheral edema Abdomen: soft and non-tender, no hepatosplenomegaly, BS normal. Musculoskeletal: No deformities, no cyanosis or clubbing Neuro:  alert, non focal, no tremors        Assessment & Plan:

## 2021-05-22 NOTE — Patient Instructions (Signed)
Schedule HST

## 2021-06-02 ENCOUNTER — Ambulatory Visit: Payer: No Typology Code available for payment source | Admitting: Nurse Practitioner

## 2021-06-04 ENCOUNTER — Other Ambulatory Visit: Payer: Self-pay

## 2021-06-04 ENCOUNTER — Encounter: Payer: Self-pay | Admitting: Nurse Practitioner

## 2021-06-04 ENCOUNTER — Ambulatory Visit (INDEPENDENT_AMBULATORY_CARE_PROVIDER_SITE_OTHER): Payer: No Typology Code available for payment source | Admitting: Nurse Practitioner

## 2021-06-04 VITALS — BP 128/74 | HR 81 | Temp 98.2°F | Ht 64.0 in | Wt 241.0 lb

## 2021-06-04 DIAGNOSIS — F5101 Primary insomnia: Secondary | ICD-10-CM | POA: Diagnosis not present

## 2021-06-04 DIAGNOSIS — F411 Generalized anxiety disorder: Secondary | ICD-10-CM | POA: Diagnosis not present

## 2021-06-04 DIAGNOSIS — F331 Major depressive disorder, recurrent, moderate: Secondary | ICD-10-CM

## 2021-06-04 DIAGNOSIS — M5442 Lumbago with sciatica, left side: Secondary | ICD-10-CM

## 2021-06-04 DIAGNOSIS — G8929 Other chronic pain: Secondary | ICD-10-CM

## 2021-06-04 MED ORDER — ZOLPIDEM TARTRATE 10 MG PO TABS: 10.0000 mg | ORAL_TABLET | Freq: Every evening | ORAL | 0 refills | Status: DC | PRN

## 2021-06-04 MED ORDER — DULOXETINE HCL 20 MG PO CPEP: 40.0000 mg | ORAL_CAPSULE | Freq: Every day | ORAL | 1 refills | Status: DC

## 2021-06-04 MED ORDER — TIZANIDINE HCL 2 MG PO CAPS: 2.0000 mg | ORAL_CAPSULE | Freq: Three times a day (TID) | ORAL | 0 refills | Status: DC | PRN

## 2021-06-04 NOTE — Progress Notes (Signed)
Subjective:    Patient ID: Sabrina Roberts, female    DOB: May 16, 1971, 50 y.o.   MRN: [id number]  HPI: Sabrina Roberts is a 50 y.o. female presenting for back pain.  Chief Complaint  Patient presents with   Back Pain    Back pain follow up, pain is worse, lifting spouse who just got out of hosp.   BACK PAIN Patient reports her husband was recently hospitalized for an abscess in his hand.  She reports he is extremely weak and currently only able to ambulate small distances.  She is staying at home providing 24-hour care for him including dressing changes, antibiotics through PICC line, and help with ADLs.  Reports PT is coming up 3 times weekly to help with his strength, however he is dependent on her muscular pain.  She reports her back pain has been worsening since this time.  She is due to see orthopedic surgeon next week.  She is requesting refills of tizanidine to help with her pain.   Due to see orthopedic surgeon 6/16 Duration: chronic Mechanism of injury: overuse Location: low left Onset: sudden Severity: severe Quality: feels like rubber band popping Frequency: multiple times daily Radiation: L leg above the knee Aggravating factors: walking, lifting Alleviating factors: tizanidine Status: worse Treatments attempted:  rest, ice, Tylenol, tizanidine Relief with NSAIDs?: No NSAIDs Taken Nighttime pain:  no Paresthesias / decreased sensation:  no Bowel / bladder incontinence:  no Fevers:  no Dysuria / urinary frequency:  no  Patient is also requesting refill of Ambien to help with her sleep.  She has been taking this more frequently because she has been at home and under more stress caring for her husband.  Patient also reports duloxetine has been helping with her mood and is wondering if we can increase it to 40 mg.  Allergies  Allergen Reactions   Actos [Pioglitazone Hydrochloride] Anaphylaxis, Hives and Other (See Comments)    Chest pain    Latex Rash     Outpatient Encounter Medications as of 06/04/2021  Medication Sig   DULoxetine (CYMBALTA) 20 MG capsule Take 2 capsules (40 mg total) by mouth daily.   ferrous sulfate 325 (65 FE) MG tablet Take 325 mg by mouth daily with breakfast.   Blood Glucose Monitoring Suppl (BLOOD GLUCOSE SYSTEM PAK) KIT Please dispense based on patient and insurance preference.  Use as directed to monitor FSBS 1-2 times daily.  Dx E11.9   busPIRone (BUSPAR) 7.5 MG tablet Take 1 tablet (7.5 mg total) by mouth 2 (two) times daily.   Cholecalciferol (VITAMIN D3) 250 MCG (10000 UT) capsule Take 1 capsule (10,000 Units total) by mouth daily.   diazepam (VALIUM) 5 MG tablet Take 0.5 tablets (2.5 mg total) by mouth daily as needed for anxiety.   Dulaglutide (TRULICITY) 1.5 CB/7.6EG SOPN Inject 0.5 mLs (1.5 mg total) into the skin once a week.   glucose blood test strip Please dispense based on patient and insurance preference.  Use as directed to monitor FSBS 1-2 times daily.  Dx E11.9   HYDROcodone-acetaminophen (NORCO) 5-325 MG tablet Take 1 tablet by mouth every 6 (six) hours as needed for moderate pain.   Lancets MISC Please dispense based on patient and insurance preference.  Use as directed to monitor FSBS 1-2 times daily.  Dx E11.9   Multiple Vitamin (MULTIVITAMIN) tablet Take 1 tablet by mouth daily.    pregabalin (LYRICA) 300 MG capsule Take 1 capsule (300 mg total) by mouth 2 (  two) times daily.   tizanidine (ZANAFLEX) 2 MG capsule Take 1 capsule (2 mg total) by mouth 3 (three) times daily as needed for muscle spasms. Take first dose at night time and monitor for drowsiness.  Do not take while driving or operating heavy machinery if this medication makes you drowsy.   zolpidem (AMBIEN) 10 MG tablet Take 1 tablet (10 mg total) by mouth at bedtime as needed.   [DISCONTINUED] DULoxetine (CYMBALTA) 30 MG capsule Take 1 capsule (30 mg total) by mouth daily.   [DISCONTINUED] tizanidine (ZANAFLEX) 2 MG capsule Take 1 capsule  (2 mg total) by mouth 3 (three) times daily as needed for muscle spasms. Take first dose at night time and monitor for drowsiness.  Do not take while driving or operating heavy machinery if this medication makes you drowsy.   [DISCONTINUED] zolpidem (AMBIEN) 10 MG tablet Take 1 tablet (10 mg total) by mouth at bedtime as needed.   Facility-Administered Encounter Medications as of 06/04/2021  Medication   cyanocobalamin ((VITAMIN B-12)) injection 1,000 mcg    Patient Active Problem List   Diagnosis Date Noted   OSA (obstructive sleep apnea) 05/22/2021   Controlled substance agreement signed 05/01/2021   Panic attacks 09/10/2020   GAD (generalized anxiety disorder) 05/03/2020   RLS (restless legs syndrome) 02/07/2020   Abnormal TSH 11/15/2018   Rectal bleeding 11/04/2017   Diarrhea 11/04/2017   Thoracic disc herniation 09/06/2017   Chronic back pain 09/06/2017   Anemia 09/06/2017   B12 deficiency 06/08/2017   Vitamin D deficiency 06/08/2017   MDD (major depressive disorder) 06/08/2017   Bulging of lumbar intervertebral disc 06/08/2017   Carpal tunnel syndrome of right wrist    Borderline abnormal TFTs 10/29/2014   Irregular menses 08/01/2013   Insomnia 10/14/2012   DM (diabetes mellitus) ([place]) 02/28/2012   Hyperlipidemia 02/28/2012   Class 3 obesity 02/28/2012   Thyroid nodule 02/28/2012    Past Medical History:  Diagnosis Date   Anemia    Anxiety    Arthritis    Depression    Diabetes mellitus    Hyperlipidemia    Hypertension    Obesity    Sleep apnea    Has CPAP but has lost weight and hasnt used    Relevant past medical, surgical, family and social history reviewed and updated as indicated. Interim medical history since our last visit reviewed.  Review of Systems Per HPI unless specifically indicated above     Objective:    BP 128/74   Pulse 81   Temp 98.2 F (36.8 C)   Ht '5\' 4"'  (1.626 m)   Wt 241 lb (109.3 kg)   LMP  (LMP Unknown)   SpO2 98%   BMI  41.37 kg/m   Wt Readings from Last 3 Encounters:  06/04/21 241 lb (109.3 kg)  05/22/21 245 lb 6.4 oz (111.3 kg)  05/01/21 245 lb 9.6 oz (111.4 kg)    Physical Exam Vitals and nursing note reviewed.  Constitutional:      General: She is not in acute distress.    Appearance: Normal appearance. She is obese. She is not toxic-appearing.  HENT:     Head: Normocephalic and atraumatic.     Right Ear: External ear normal.     Left Ear: External ear normal.  Musculoskeletal:        General: Normal range of motion.       Back:     Right lower leg: No edema.     Left lower  leg: No edema.     Comments: Tenderness to palpation in the area marked above  Skin:    General: Skin is warm and dry.     Coloration: Skin is not jaundiced or pale.     Findings: No erythema.  Neurological:     Mental Status: She is alert and oriented to person, place, and time.  Psychiatric:        Mood and Affect: Mood normal.        Behavior: Behavior normal.        Thought Content: Thought content normal.        Judgment: Judgment normal.       Assessment & Plan:   Problem List Items Addressed This Visit       Other   MDD (major depressive disorder) - Primary    Chronic.  Will increase duloxetine to 40 mg daily.  Follow-up 1 month.       Relevant Medications   DULoxetine (CYMBALTA) 20 MG capsule   Insomnia    Chronic.  Likely multifactorial-we will give refill of Ambien today.  PDMP reviewed and appropriate.  Continue eventually cut down on Ambien use.  Patient knows not to combine this medication with hydrocodone or tizanidine.  Follow-up 3 months.       Relevant Medications   zolpidem (AMBIEN) 10 MG tablet   GAD (generalized anxiety disorder)    Chronic.  Increase to duloxetine 40 mg daily given increase in stress.  Follow-up in 1 month.       Relevant Medications   DULoxetine (CYMBALTA) 20 MG capsule   Chronic back pain    Chronic.  We will give refill of tizanidine to use sparingly-not  to combine with Ambien or hydrocodone.  Encouraged close follow-up with orthopedic surgeon next week.       Relevant Medications   tizanidine (ZANAFLEX) 2 MG capsule   DULoxetine (CYMBALTA) 20 MG capsule     Follow up plan: Return in about 4 weeks (around 07/02/2021) for chronic f/u with labs.

## 2021-06-06 NOTE — Assessment & Plan Note (Signed)
Chronic.  We will give refill of tizanidine to use sparingly-not to combine with Ambien or hydrocodone.  Encouraged close follow-up with orthopedic surgeon next week.

## 2021-06-06 NOTE — Assessment & Plan Note (Signed)
Chronic.  Increase to duloxetine 40 mg daily given increase in stress.  Follow-up in 1 month.

## 2021-06-06 NOTE — Assessment & Plan Note (Signed)
Chronic.  Likely multifactorial-we will give refill of Ambien today.  PDMP reviewed and appropriate.  Continue eventually cut down on Ambien use.  Patient knows not to combine this medication with hydrocodone or tizanidine.  Follow-up 3 months.

## 2021-06-06 NOTE — Assessment & Plan Note (Signed)
Chronic.  Will increase duloxetine to 40 mg daily.  Follow-up 1 month.

## 2021-06-24 ENCOUNTER — Other Ambulatory Visit: Payer: Self-pay | Admitting: Specialist

## 2021-06-24 DIAGNOSIS — M5417 Radiculopathy, lumbosacral region: Secondary | ICD-10-CM

## 2021-06-28 ENCOUNTER — Other Ambulatory Visit: Payer: Self-pay

## 2021-06-28 ENCOUNTER — Emergency Department (HOSPITAL_COMMUNITY)
Admission: EM | Admit: 2021-06-28 | Discharge: 2021-06-28 | Disposition: A | Discharge status: Home / Self Care | Payer: No Typology Code available for payment source | Attending: Emergency Medicine | Admitting: Emergency Medicine

## 2021-06-28 ENCOUNTER — Encounter (HOSPITAL_COMMUNITY): Payer: Self-pay | Admitting: *Deleted

## 2021-06-28 ENCOUNTER — Emergency Department (HOSPITAL_COMMUNITY): Payer: No Typology Code available for payment source

## 2021-06-28 DIAGNOSIS — Z91040 Latex allergy status: Secondary | ICD-10-CM | POA: Insufficient documentation

## 2021-06-28 DIAGNOSIS — I10 Essential (primary) hypertension: Secondary | ICD-10-CM | POA: Insufficient documentation

## 2021-06-28 DIAGNOSIS — Z79899 Other long term (current) drug therapy: Secondary | ICD-10-CM | POA: Insufficient documentation

## 2021-06-28 DIAGNOSIS — E119 Type 2 diabetes mellitus without complications: Secondary | ICD-10-CM | POA: Insufficient documentation

## 2021-06-28 DIAGNOSIS — R5383 Other fatigue: Secondary | ICD-10-CM | POA: Diagnosis not present

## 2021-06-28 DIAGNOSIS — R531 Weakness: Secondary | ICD-10-CM | POA: Diagnosis not present

## 2021-06-28 DIAGNOSIS — M791 Myalgia, unspecified site: Secondary | ICD-10-CM | POA: Diagnosis not present

## 2021-06-28 DIAGNOSIS — B370 Candidal stomatitis: Secondary | ICD-10-CM | POA: Insufficient documentation

## 2021-06-28 DIAGNOSIS — R0602 Shortness of breath: Secondary | ICD-10-CM | POA: Diagnosis not present

## 2021-06-28 DIAGNOSIS — Z20822 Contact with and (suspected) exposure to covid-19: Secondary | ICD-10-CM | POA: Diagnosis not present

## 2021-06-28 LAB — URINALYSIS, ROUTINE W REFLEX MICROSCOPIC
Bilirubin Urine: NEGATIVE
Glucose, UA: NEGATIVE mg/dL
Hgb urine dipstick: NEGATIVE
Ketones, ur: NEGATIVE mg/dL
Leukocytes,Ua: NEGATIVE
Nitrite: NEGATIVE
Protein, ur: NEGATIVE mg/dL
Specific Gravity, Urine: 1.02 (ref 1.005–1.030)
pH: 5 (ref 5.0–8.0)

## 2021-06-28 LAB — COMPREHENSIVE METABOLIC PANEL
ALT: 27 U/L (ref 0–44)
AST: 20 U/L (ref 15–41)
Albumin: 4 g/dL (ref 3.5–5.0)
Alkaline Phosphatase: 87 U/L (ref 38–126)
Anion gap: 8 (ref 5–15)
BUN: 22 mg/dL — ABNORMAL HIGH (ref 6–20)
CO2: 21 mmol/L — ABNORMAL LOW (ref 22–32)
Calcium: 8.8 mg/dL — ABNORMAL LOW (ref 8.9–10.3)
Chloride: 111 mmol/L (ref 98–111)
Creatinine, Ser: 0.98 mg/dL (ref 0.44–1.00)
GFR, Estimated: >60 mL/min (ref >60)
Glucose, Bld: 107 mg/dL — ABNORMAL HIGH (ref 70–99)
Potassium: 4.1 mmol/L (ref 3.5–5.1)
Sodium: 140 mmol/L (ref 135–145)
Total Bilirubin: 1 mg/dL (ref 0.3–1.2)
Total Protein: 7.2 g/dL (ref 6.5–8.1)

## 2021-06-28 LAB — CBC WITH DIFFERENTIAL/PLATELET
Abs Immature Granulocytes: 0.07 10*3/uL (ref 0.00–0.07)
Basophils Absolute: 0 10*3/uL (ref 0.0–0.1)
Basophils Relative: 0 %
Eosinophils Absolute: 0.1 10*3/uL (ref 0.0–0.5)
Eosinophils Relative: 1 %
HCT: 36.6 % (ref 36.0–46.0)
Hemoglobin: 12 g/dL (ref 12.0–15.0)
Immature Granulocytes: 1 %
Lymphocytes Relative: 19 %
Lymphs Abs: 2.3 10*3/uL (ref 0.7–4.0)
MCH: 31.9 pg (ref 26.0–34.0)
MCHC: 32.8 g/dL (ref 30.0–36.0)
MCV: 97.3 fL (ref 80.0–100.0)
Monocytes Absolute: 0.9 10*3/uL (ref 0.1–1.0)
Monocytes Relative: 8 %
Neutro Abs: 8.8 10*3/uL — ABNORMAL HIGH (ref 1.7–7.7)
Neutrophils Relative %: 71 %
Platelets: 283 10*3/uL (ref 150–400)
RBC: 3.76 MIL/uL — ABNORMAL LOW (ref 3.87–5.11)
RDW: 13.7 % (ref 11.5–15.5)
WBC: 12.2 10*3/uL — ABNORMAL HIGH (ref 4.0–10.5)
nRBC: 0 % (ref 0.0–0.2)

## 2021-06-28 LAB — RESP PANEL BY RT-PCR (FLU A&B, COVID) ARPGX2
Influenza A by PCR: NEGATIVE
Influenza B by PCR: NEGATIVE
SARS Coronavirus 2 by RT PCR: NEGATIVE

## 2021-06-28 LAB — PREGNANCY, URINE: Preg Test, Ur: NEGATIVE

## 2021-06-28 LAB — D-DIMER, QUANTITATIVE: D-Dimer, Quant: 0.43 ug/mL-FEU (ref 0.00–0.50)

## 2021-06-28 LAB — TROPONIN I (HIGH SENSITIVITY)
Troponin I (High Sensitivity): 2 ng/L (ref <18)
Troponin I (High Sensitivity): 2 ng/L (ref <18)

## 2021-06-28 MED ORDER — SODIUM CHLORIDE 0.9 % IV BOLUS: 1000.0000 mL | Freq: Once | INTRAVENOUS | Status: DC

## 2021-06-28 MED ORDER — NYSTATIN 100000 UNIT/ML MT SUSP: 500000.0000 [IU] | Freq: Four times a day (QID) | OROMUCOSAL | 0 refills | Status: DC

## 2021-06-28 NOTE — ED Triage Notes (Signed)
C/o being tired and feeling weak for weeks. Caretaker for spouse

## 2021-06-28 NOTE — ED Provider Notes (Signed)
[facility] EMERGENCY DEPARTMENT Provider Note   CSN: [id number] Arrival date & time: 06/28/21  1327     History Chief Complaint  Patient presents with   Fatigue    Sabrina Roberts is a 50 y.o. female.  HPI      Sabrina Roberts is a 50 y.o. female with past medical history that includes anemia, anxiety, diabetes, hypertension, and hyperlipidemia.  She presents to the Emergency Department complaining of generalized weakness, body aches, and fatigue.  Symptoms have been present for 2 weeks and worse x4 days.  She states that she does not have the energy to complete her daily activities.  She also notes having white patches to her tongue mouth and throat for several days.  She complains of pain in her mouth with swallowing certain liquids or eating solid foods.  History of same. Appetite has been diminished and she endorses having some loose stools.  No known COVID exposures, but states that her daughter was home recently with a sinus infection but tested negative for COVID.  She was taken to home COVID test that were both negative.  She also complains of some shortness of breath with exertion.  No cough, fever, chills, abdominal pain or chest pain.   Past Medical History:  Diagnosis Date   Anemia    Anxiety    Arthritis    Depression    Diabetes mellitus    Hyperlipidemia    Hypertension    Obesity    Sleep apnea    Has CPAP but has lost weight and hasnt used    Patient Active Problem List   Diagnosis Date Noted   OSA (obstructive sleep apnea) 05/22/2021   Controlled substance agreement signed 05/01/2021   Panic attacks 09/10/2020   GAD (generalized anxiety disorder) 05/03/2020   RLS (restless legs syndrome) 02/07/2020   Abnormal TSH 11/15/2018   Rectal bleeding 11/04/2017   Diarrhea 11/04/2017   Thoracic disc herniation 09/06/2017   Chronic back pain 09/06/2017   Anemia 09/06/2017   B12 deficiency 06/08/2017   Vitamin D deficiency 06/08/2017   MDD (major depressive  disorder) 06/08/2017   Bulging of lumbar intervertebral disc 06/08/2017   Carpal tunnel syndrome of right wrist    Borderline abnormal TFTs 10/29/2014   Irregular menses 08/01/2013   Insomnia 10/14/2012   DM (diabetes mellitus) ([place]) 02/28/2012   Hyperlipidemia 02/28/2012   Class 3 obesity 02/28/2012   Thyroid nodule 02/28/2012    Past Surgical History:  Procedure Laterality Date   CARPAL TUNNEL RELEASE Right 10/01/2016   Procedure: RIGHT CARPAL TUNNEL RELEASE;  Surgeon: Carole Civil, MD;  Location: [facility];  Service: Orthopedics;  Laterality: Right;   CARPAL TUNNEL RELEASE Left 10/22/2016   Procedure: CARPAL TUNNEL RELEASE;  Surgeon: Carole Civil, MD;  Location: [facility];  Service: Orthopedics;  Laterality: Left;   CESAREAN SECTION  2002   CHOLECYSTECTOMY  dec 2012   GASTRIC BYPASS  dec 2012   KNEE ARTHROSCOPY Right      OB History   No obstetric history on file.     Family History  Problem Relation Age of Onset   Hypertension Mother    Depression Mother    Diabetes Mother    Irritable bowel syndrome Mother    Heart disease Mother    Stroke Mother    Hypertension Father    Hyperlipidemia Father    Prostate cancer Father    Colon polyps Father    Heart disease Father  Kidney Stones Father    Diverticulitis Father    Colon cancer Father    Breast cancer Paternal Grandmother    Colon cancer Paternal Grandfather    Diabetes Maternal Grandmother    Breast cancer Maternal Aunt    Stomach cancer Paternal Uncle    Colon cancer Other        Paternal Great Uncles x 4   Colon polyps Paternal Aunt        x 3    Social History   Tobacco Use   Smoking status: Never   Smokeless tobacco: Never  Vaping Use   Vaping Use: Never used  Substance Use Topics   Alcohol use: Yes    Alcohol/week: 0.0 standard drinks    Comment: occasionally   Drug use: No    Home Medications Prior to Admission medications   Medication Sig Start Date End Date Taking?  Authorizing Provider  Blood Glucose Monitoring Suppl (BLOOD GLUCOSE SYSTEM PAK) KIT Please dispense based on patient and insurance preference.  Use as directed to monitor FSBS 1-2 times daily.  Dx E11.9 05/01/21   Eulogio Bear, NP  busPIRone (BUSPAR) 7.5 MG tablet Take 1 tablet (7.5 mg total) by mouth 2 (two) times daily. 05/01/21   Eulogio Bear, NP  Cholecalciferol (VITAMIN D3) 250 MCG (10000 UT) capsule Take 1 capsule (10,000 Units total) by mouth daily. 03/21/21   Eulogio Bear, NP  diazepam (VALIUM) 5 MG tablet Take 0.5 tablets (2.5 mg total) by mouth daily as needed for anxiety. 05/01/21   Eulogio Bear, NP  Dulaglutide (TRULICITY) 1.5 NG/2.9BM SOPN Inject 0.5 mLs (1.5 mg total) into the skin once a week. 06/06/20   [place], Modena Nunnery, MD  DULoxetine (CYMBALTA) 20 MG capsule Take 2 capsules (40 mg total) by mouth daily. 06/04/21   Eulogio Bear, NP  ferrous sulfate 325 (65 FE) MG tablet Take 325 mg by mouth daily with breakfast.    [provider]  glucose blood test strip Please dispense based on patient and insurance preference.  Use as directed to monitor FSBS 1-2 times daily.  Dx E11.9 05/01/21   Eulogio Bear, NP  HYDROcodone-acetaminophen (NORCO) 5-325 MG tablet Take 1 tablet by mouth every 6 (six) hours as needed for moderate pain. 05/03/20   Alycia Rossetti, MD  Lancets MISC Please dispense based on patient and insurance preference.  Use as directed to monitor FSBS 1-2 times daily.  Dx E11.9 05/01/21   Eulogio Bear, NP  Multiple Vitamin (MULTIVITAMIN) tablet Take 1 tablet by mouth daily.     [provider]  pregabalin (LYRICA) 300 MG capsule Take 1 capsule (300 mg total) by mouth 2 (two) times daily. 05/01/21   Eulogio Bear, NP  tizanidine (ZANAFLEX) 2 MG capsule Take 1 capsule (2 mg total) by mouth 3 (three) times daily as needed for muscle spasms. Take first dose at night time and monitor for drowsiness.  Do not take while driving or  operating heavy machinery if this medication makes you drowsy. 06/04/21   Eulogio Bear, NP  zolpidem (AMBIEN) 10 MG tablet Take 1 tablet (10 mg total) by mouth at bedtime as needed. 06/04/21   Eulogio Bear, NP    Allergies    Actos [pioglitazone hydrochloride] and Latex  Review of Systems   Review of Systems  Constitutional:  Negative for chills, fatigue and fever.  HENT:  Negative for trouble swallowing.   Eyes:  Negative for visual disturbance.  Respiratory:  Negative for cough and shortness of breath.   Cardiovascular:  Negative for chest pain, palpitations and leg swelling.  Gastrointestinal:  Negative for abdominal pain, nausea and vomiting.  Genitourinary:  Negative for dysuria and flank pain.  Musculoskeletal:  Positive for myalgias. Negative for arthralgias, back pain, neck pain and neck stiffness.  Skin:  Negative for rash.  Neurological:  Positive for weakness (generalized weakness). Negative for dizziness, syncope, speech difficulty, numbness and headaches.  Hematological:  Does not bruise/bleed easily.   Physical Exam Updated Vital Signs BP 136/74 (BP Location: Right Arm)   Pulse 81   Temp 99.1 F (37.3 C) (Oral)   Resp 17   Ht _0  (1.626 m)   Wt 106.6 kg   LMP  (LMP Unknown)   SpO2 99%   BMI 40.34 kg/m   Physical Exam Vitals and nursing note reviewed.  Constitutional:      General: She is not in acute distress.    Appearance: Normal appearance. She is not ill-appearing.  HENT:     Mouth/Throat:     Mouth: Mucous membranes are moist.     Dentition: No dental tenderness or gingival swelling.     Pharynx: Uvula midline. No pharyngeal swelling, posterior oropharyngeal erythema or uvula swelling.     Comments: Small amount of white plaques to the posterior tongue, soft palate and oropharynx.  No lesions of the buccal mucosa, no bleeding Neck:     Meningeal: Kernig's sign absent.  Cardiovascular:     Rate and Rhythm: Normal rate and regular rhythm.      Pulses: Normal pulses.  Pulmonary:     Effort: Pulmonary effort is normal. No respiratory distress.  Chest:     Chest wall: No tenderness.  Abdominal:     Palpations: Abdomen is soft.     Tenderness: There is no abdominal tenderness.  Musculoskeletal:        General: Normal range of motion.     Cervical back: Normal range of motion. No rigidity.     Right lower leg: No edema.     Left lower leg: No edema.  Skin:    General: Skin is warm.     Capillary Refill: Capillary refill takes less than 2 seconds.  Neurological:     General: No focal deficit present.     Mental Status: She is alert.     Sensory: No sensory deficit.     Motor: No weakness.    ED Results / Procedures / Treatments   Labs (all labs ordered are listed, but only abnormal results are displayed) Labs Reviewed  CBC WITH DIFFERENTIAL/PLATELET - Abnormal; Notable for the following components:      Result Value   WBC 12.2 (*)    RBC 3.76 (*)    Neutro Abs 8.8 (*)    All other components within normal limits  COMPREHENSIVE METABOLIC PANEL - Abnormal; Notable for the following components:   CO2 21 (*)    Glucose, Bld 107 (*)    BUN 22 (*)    Calcium 8.8 (*)    All other components within normal limits  RESP PANEL BY RT-PCR (FLU A&B, COVID) ARPGX2  URINALYSIS, ROUTINE W REFLEX MICROSCOPIC  PREGNANCY, URINE  D-DIMER, QUANTITATIVE  TROPONIN I (HIGH SENSITIVITY)  TROPONIN I (HIGH SENSITIVITY)    EKG None   EKG Interpretation  Date/Time: 06/28/21  Ventricular Rate: 71 PR Interval: 148  QRS Duration:  QT Interval:   QTC Calculation: 399  R Axis:  Text Interpretation: Sinus rhythm with baseline wander in leads III and V5.  EKG reviewed by Dr. Almyra Free  Radiology DG Chest Portable 1 View  Result Date: 06/28/2021 CLINICAL DATA:  Weakness with shortness of breath and chest tightness. EXAM: PORTABLE CHEST 1 VIEW COMPARISON:  08/08/2019 FINDINGS: The heart size and mediastinal contours are within normal  limits. Both lungs are clear. The visualized skeletal structures are unremarkable. IMPRESSION: No active disease. Electronically Signed   By: Nolon Nations M.D.   On: 06/28/2021 15:04    Procedures Procedures   Medications Ordered in ED Medications  sodium chloride 0.9 % bolus 1,000 mL (has no administration in time range)    ED Course  I have reviewed the triage vital signs and the nursing notes.  Pertinent labs & imaging results that were available during my care of the patient were reviewed by me and considered in my medical decision making (see chart for details).   MDM Rules/Calculators/A&P                          Patient here for evaluation of generalized weakness, fatigue, and body aches.  Symptoms have been present for 2 weeks but worse for 4 days.  No fever, vomiting or diarrhea.  No known COVID exposures.  She has been vaccinated and booster x1.  On exam, patient well-appearing.  Nontoxic.  Abdomen is soft without guarding or rebound tenderness.  No chest pain or shortness of breath.  She does admit to increased stressors and fatigue due to caring for her sick husband.  We will check labs, EKG, urinalysis and chest x-ray.  She does have some mild thrush of her oropharynx.  Reports history of same.  Has history of diabetes.  No recent antibiotic use.  No history of immunocompromise state.  Labs interpreted by me, chemistries show a mildly elevated BUN with normal creatinine.  Otherwise unremarkable.  D-dimer unremarkable.  Urinalysis without evidence of infection.  She is not pregnant.  COVID and influenza testing negative.  Delta troponin unchanged.  EKG without acute ischemic changes.  Chest x-ray also without acute findings.  IV fluids were ordered, patient has been a difficult stick.  Multiple attempts made by nursing staff.  After discussion of work-up, patient no longer wants IV fluids.  She is tolerating oral fluids without difficulty.  Source of pt's sx's unclear, I  explained that her symptoms could possibly be related to dehydration.  Doubt emergent process.  She is agreeable to increase fluids at home.  She has outpatient follow-up next week with PCP.   Final Clinical Impression(s) / ED Diagnoses Final diagnoses:  Generalized weakness  Thrush, oral    Rx / DC Orders ED Discharge Orders     None        Kem Parkinson, Hershal Coria 06/28/21 1832    Wyvonnia Dusky, MD 06/29/21 [phone]

## 2021-06-28 NOTE — Discharge Instructions (Addendum)
Overall, your work-up today was reassuring.  We did not find any acute processes to explain your generalized weakness.  Use the prescribed mouthwash as directed.  Follow-up with your primary care provider for recheck next week.  I recommend that you drink plenty of fluids for the next several days.  Return emergency department for any new or worsening symptoms.

## 2021-07-02 ENCOUNTER — Encounter: Payer: Self-pay | Admitting: Nurse Practitioner

## 2021-07-02 ENCOUNTER — Ambulatory Visit (INDEPENDENT_AMBULATORY_CARE_PROVIDER_SITE_OTHER): Payer: No Typology Code available for payment source | Admitting: Nurse Practitioner

## 2021-07-02 ENCOUNTER — Other Ambulatory Visit: Payer: Self-pay

## 2021-07-02 VITALS — BP 128/80 | HR 69 | Temp 99.0°F | Ht 64.0 in | Wt 232.8 lb

## 2021-07-02 DIAGNOSIS — K121 Other forms of stomatitis: Secondary | ICD-10-CM

## 2021-07-02 DIAGNOSIS — E559 Vitamin D deficiency, unspecified: Secondary | ICD-10-CM | POA: Diagnosis not present

## 2021-07-02 DIAGNOSIS — M5136 Other intervertebral disc degeneration, lumbar region: Secondary | ICD-10-CM

## 2021-07-02 DIAGNOSIS — F331 Major depressive disorder, recurrent, moderate: Secondary | ICD-10-CM | POA: Diagnosis not present

## 2021-07-02 DIAGNOSIS — E785 Hyperlipidemia, unspecified: Secondary | ICD-10-CM | POA: Diagnosis not present

## 2021-07-02 DIAGNOSIS — F411 Generalized anxiety disorder: Secondary | ICD-10-CM

## 2021-07-02 DIAGNOSIS — M5126 Other intervertebral disc displacement, lumbar region: Secondary | ICD-10-CM

## 2021-07-02 DIAGNOSIS — E119 Type 2 diabetes mellitus without complications: Secondary | ICD-10-CM

## 2021-07-02 DIAGNOSIS — F5101 Primary insomnia: Secondary | ICD-10-CM

## 2021-07-02 MED ORDER — DIAZEPAM 5 MG PO TABS: 2.5000 mg | ORAL_TABLET | Freq: Every day | ORAL | 0 refills | Status: DC | PRN

## 2021-07-02 MED ORDER — TRULICITY 1.5 MG/0.5ML ~~LOC~~ SOAJ: 1.5000 mg | SUBCUTANEOUS | 0 refills | Status: DC

## 2021-07-02 MED ORDER — NYSTATIN 100000 UNIT/ML MT SUSP: 500000.0000 [IU] | Freq: Four times a day (QID) | OROMUCOSAL | 0 refills | Status: AC

## 2021-07-02 NOTE — Progress Notes (Signed)
Subjective:    Patient ID: Sabrina Roberts, female    DOB: 25-Mar-1971, 50 y.o.   MRN: [id number]  HPI: Sabrina Roberts is a 50 y.o. female presenting for follow up.  Chief Complaint  Patient presents with   Depression    Medication is working well. Did go to the er this pt wkn, told she was dehydrated, pt is fasting if any blood work is needed   Medication Refill    trulicity   Reports she felt bad this weekend-felt very weak.  She went to the ER and had lab work done and they said she was dehydrated.  Since her discharge from the ER she has been trying to drink more fluids.  She reports has been hard because of the thrush.  DIABETES Currently taking Trulicity 1.5 mg weekly.  Last A1c in March 2022 was 6.2%.  She has not been checking her blood sugars at home and reports increased stress with caring for her husband. Hypoglycemic episodes:no Polydipsia/polyuria: no Visual disturbance: no Chest pain: no Paresthesias: no Glucose Monitoring: no Taking Insulin?: no Blood Pressure Monitoring: not checking Retinal Examination: Not up to Date - she is aware and plans on scheduling Foot Exam: Not up to Date Diabetic Education: Completed Pneumovax: Not up to Date Influenza: Up to Date Aspirin: no  DEPRESSION AND ANXIETY Reports she can tell a big difference in her mood.  Has maintained on BuSpar 7.5 mg daily, Cymbalta to 40 mg daily, and as needed Valium 2.5 mg 3-4 x weekly.  Last refill of Valium was in the beginning of May and she reports she only recently ran out.  Requesting refill today.  Also takes Ambien to help sleep as needed-does not need refills today. Mood status: better Satisfied with current treatment?: yes Symptom severity: moderate  Duration of current treatment : chronic Side effects: no Medication compliance: excellent compliance Psychotherapy/counseling: no Depressed mood: yes Anxious mood: no Anhedonia: yes Significant weight loss or gain: no Insomnia:  yes Fatigue: yes Feelings of worthlessness or guilt: yes Impaired concentration/indecisiveness: yes Suicidal ideations: no Hopelessness: no Crying spells: no Depression screen [facility] 2/9 07/02/2021 05/03/2020 03/21/2019 03/25/2018 01/05/2018  Decreased Interest 1 0 '2 2 1  ' Down, Depressed, Hopeless 2 0 '2 2 1  ' PHQ - 2 Score 3 0 '4 4 2  ' Altered sleeping '1 3 1 3 1  ' Tired, decreased energy '2 3 1 1 1  ' Change in appetite '1 2 1 1 1  ' Feeling bad or failure about yourself  1 0 '2 2 1  ' Trouble concentrating '3 3 2 2 1  ' Moving slowly or fidgety/restless 0 2 0 3 1  Suicidal thoughts 0 0 0 1 1  PHQ-9 Score '11 13 11 17 9  ' Difficult doing work/chores Not difficult at all Somewhat difficult Not difficult at all Extremely dIfficult Very difficult  Some recent data might be hidden    GAD 7 : Generalized Anxiety Score 07/02/2021  Nervous, Anxious, on Edge 0  Control/stop worrying 1  Worry too much - different things 1  Trouble relaxing 0  Restless 0  Easily annoyed or irritable 0  Afraid - awful might happen 1  Total GAD 7 Score 3  Anxiety Difficulty Not difficult at all   Follows closely with neurosurgeon-has MRI coming up and is willing to have surgery on her back.  Follows up with neurosurgeon after MRI.  Maintains on Lyrica 300 mg twice daily for back pain along with tizanidine 2 mg as  needed for muscle spasms.  History of high cholesterol-is working on dietary and lifestyle changes.  She wants to get back to increasing physical activity, however is hard for her right now due to pain and caring for her husband.  Allergies  Allergen Reactions   Actos [Pioglitazone Hydrochloride] Anaphylaxis, Hives and Other (See Comments)    Chest pain    Latex Rash    Outpatient Encounter Medications as of 07/02/2021  Medication Sig   Blood Glucose Monitoring Suppl (BLOOD GLUCOSE SYSTEM PAK) KIT Please dispense based on patient and insurance preference.  Use as directed to monitor FSBS 1-2 times daily.  Dx E11.9    busPIRone (BUSPAR) 7.5 MG tablet Take 1 tablet (7.5 mg total) by mouth 2 (two) times daily.   Cholecalciferol (VITAMIN D3) 250 MCG (10000 UT) capsule Take 1 capsule (10,000 Units total) by mouth daily.   DULoxetine (CYMBALTA) 20 MG capsule Take 2 capsules (40 mg total) by mouth daily.   ferrous sulfate 325 (65 FE) MG tablet Take 325 mg by mouth daily with breakfast.   glucose blood test strip Please dispense based on patient and insurance preference.  Use as directed to monitor FSBS 1-2 times daily.  Dx E11.9   HYDROcodone-acetaminophen (NORCO) 5-325 MG tablet Take 1 tablet by mouth every 6 (six) hours as needed for moderate pain.   Lancets MISC Please dispense based on patient and insurance preference.  Use as directed to monitor FSBS 1-2 times daily.  Dx E11.9   Multiple Vitamin (MULTIVITAMIN) tablet Take 1 tablet by mouth daily.    pregabalin (LYRICA) 300 MG capsule Take 1 capsule (300 mg total) by mouth 2 (two) times daily.   tizanidine (ZANAFLEX) 2 MG capsule Take 1 capsule (2 mg total) by mouth 3 (three) times daily as needed for muscle spasms. Take first dose at night time and monitor for drowsiness.  Do not take while driving or operating heavy machinery if this medication makes you drowsy.   zolpidem (AMBIEN) 10 MG tablet Take 1 tablet (10 mg total) by mouth at bedtime as needed.   [DISCONTINUED] diazepam (VALIUM) 5 MG tablet Take 0.5 tablets (2.5 mg total) by mouth daily as needed for anxiety.   [DISCONTINUED] Dulaglutide (TRULICITY) 1.5 LK/5.6YB SOPN Inject 0.5 mLs (1.5 mg total) into the skin once a week.   [DISCONTINUED] nystatin (MYCOSTATIN) 100000 UNIT/ML suspension Take 5 mLs (500,000 Units total) by mouth 4 (four) times daily.   diazepam (VALIUM) 5 MG tablet Take 0.5 tablets (2.5 mg total) by mouth daily as needed for anxiety.   Dulaglutide (TRULICITY) 1.5 WL/8.9HT SOPN Inject 1.5 mg into the skin once a week.   nystatin (MYCOSTATIN) 100000 UNIT/ML suspension Take 5 mLs (500,000  Units total) by mouth 4 (four) times daily for 14 days.   Facility-Administered Encounter Medications as of 07/02/2021  Medication   cyanocobalamin ((VITAMIN B-12)) injection 1,000 mcg    Patient Active Problem List   Diagnosis Date Noted   OSA (obstructive sleep apnea) 05/22/2021   Controlled substance agreement signed 05/01/2021   Panic attacks 09/10/2020   GAD (generalized anxiety disorder) 05/03/2020   RLS (restless legs syndrome) 02/07/2020   Abnormal TSH 11/15/2018   Rectal bleeding 11/04/2017   Diarrhea 11/04/2017   Thoracic disc herniation 09/06/2017   Chronic back pain 09/06/2017   Anemia 09/06/2017   B12 deficiency 06/08/2017   Vitamin D deficiency 06/08/2017   MDD (major depressive disorder) 06/08/2017   Bulging of lumbar intervertebral disc 06/08/2017   Carpal tunnel syndrome  of right wrist    Borderline abnormal TFTs 10/29/2014   Irregular menses 08/01/2013   Insomnia 10/14/2012   DM (diabetes mellitus) ([place]) 02/28/2012   Hyperlipidemia 02/28/2012   Class 3 obesity 02/28/2012   Thyroid nodule 02/28/2012    Past Medical History:  Diagnosis Date   Anemia    Anxiety    Arthritis    Depression    Diabetes mellitus    Hyperlipidemia    Hypertension    Obesity    Sleep apnea    Has CPAP but has lost weight and hasnt used    Relevant past medical, surgical, family and social history reviewed and updated as indicated. Interim medical history since our last visit reviewed.  Review of Systems Per HPI unless specifically indicated above     Objective:    BP 128/80   Pulse 69   Temp 99 F (37.2 C)   Ht '5\' 4"'  (1.626 m)   Wt 232 lb 12.8 oz (105.6 kg)   LMP  (LMP Unknown)   SpO2 96%   BMI 39.96 kg/m   Wt Readings from Last 3 Encounters:  07/02/21 232 lb 12.8 oz (105.6 kg)  06/28/21 235 lb (106.6 kg)  06/04/21 241 lb (109.3 kg)    Physical Exam Vitals and nursing note reviewed.  Constitutional:      General: She is not in acute distress.     Appearance: Normal appearance. She is obese. She is not toxic-appearing.  HENT:     Head: Normocephalic and atraumatic.  Neck:     Vascular: No carotid bruit.  Cardiovascular:     Rate and Rhythm: Normal rate and regular rhythm.     Heart sounds: Normal heart sounds. No murmur heard. Pulmonary:     Effort: Pulmonary effort is normal. No respiratory distress.     Breath sounds: Normal breath sounds. No wheezing, rhonchi or rales.  Abdominal:     General: Abdomen is flat. Bowel sounds are normal. There is no distension.     Palpations: Abdomen is soft.  Musculoskeletal:        General: Normal range of motion.     Cervical back: Normal range of motion.     Right lower leg: No edema.     Left lower leg: No edema.  Skin:    General: Skin is warm and dry.     Coloration: Skin is not jaundiced or pale.     Findings: No erythema.  Neurological:     Mental Status: She is alert and oriented to person, place, and time.     Motor: No weakness.     Gait: Gait normal.  Psychiatric:        Mood and Affect: Mood normal.        Behavior: Behavior normal.        Thought Content: Thought content normal.        Judgment: Judgment normal.       Assessment & Plan:   Problem List Items Addressed This Visit       Endocrine   DM (diabetes mellitus) ([place]) - Primary    Chronic.  Last A1c was in prediabetes range, which is great.  Patient has continued to lose weight on this medication-we will plan to continue.  We will check hemoglobin A1c today and recheck kidney function.  Follow-up 3 months.       Relevant Medications   Dulaglutide (TRULICITY) 1.5 OX/7.3ZH SOPN   Other Relevant Orders   COMPLETE METABOLIC PANEL WITH  GFR (Completed)   Hemoglobin A1c (Completed)     Musculoskeletal and Integument   Bulging of lumbar intervertebral disc    Follows closely with neurosurgeon- has MRI scheduled in follow-up.  Continue Lyrica 300 mg twice daily along with tizanidine at night as needed.   Refills not needed today.  Continue collaboration with neurosurgeon.         Other   Vitamin D deficiency    Currently maintained on high-dose vitamin D-we will plan to recheck vitamin D in September.  Should be taking 10,000 units weekly until then.       MDD (major depressive disorder)    Chronic.  PHQ-9 and GAD-7 improved today however remain elevated.  No SI/HI.   We will continue duloxetine at 40 mg daily, continue BuSpar 7.5 mg twice daily, and continue to limit use of Valium-only when needed.  Referral placed once again for counseling.  Follow-up in 3 months.       Relevant Medications   diazepam (VALIUM) 5 MG tablet   Insomnia    Chronic.  Ambien refill not needed today.  Try to continue to limit use of this medication-use only when needed.  Follow-up in 3 months.       Hyperlipidemia    Chronic.  Previously diet controlled-we will check lipids today-patient is fasting.  Continue low saturated fat diet including limit red meats, full fat dairy products, and fried foods.  Follow-up pending cholesterol levels.       Relevant Orders   Lipid panel (Completed)   COMPLETE METABOLIC PANEL WITH GFR (Completed)   GAD (generalized anxiety disorder)    Chronic.  PHQ-9 and GAD-7 improved today however remain elevated.  No SI/HI.   We will continue duloxetine at 40 mg daily, continue BuSpar 7.5 mg twice daily, and continue to limit use of Valium-only when needed.  Referral placed once again for counseling.  Follow-up in 3 months.        Relevant Medications   diazepam (VALIUM) 5 MG tablet   Other Relevant Orders   Ambulatory referral to Psychology   Other Visit Diagnoses     Stomatitis       Improving-we will refill nystatin to use for total 14 days.  Return to clinic if symptoms persist after complete.   Relevant Medications   nystatin (MYCOSTATIN) 100000 UNIT/ML suspension        Follow up plan: Return in about 3 months (around 10/02/2021) for dm, mood,  hld.

## 2021-07-03 LAB — COMPLETE METABOLIC PANEL WITH GFR
AG Ratio: 1.6 (calc) (ref 1.0–2.5)
ALT: 20 U/L (ref 6–29)
AST: 15 U/L (ref 10–35)
Albumin: 4.1 g/dL (ref 3.6–5.1)
Alkaline phosphatase (APISO): 79 U/L (ref 31–125)
BUN: 23 mg/dL (ref 7–25)
CO2: 20 mmol/L (ref 20–32)
Calcium: 9.1 mg/dL (ref 8.6–10.2)
Chloride: 111 mmol/L — ABNORMAL HIGH (ref 98–110)
Creat: 0.94 mg/dL (ref 0.50–1.10)
GFR, Est African American: 83 mL/min/{1.73_m2} (ref >=60)
GFR, Est Non African American: 71 mL/min/{1.73_m2} (ref >=60)
Globulin: 2.5 g/dL (calc) (ref 1.9–3.7)
Glucose, Bld: 103 mg/dL — ABNORMAL HIGH (ref 65–99)
Potassium: 4.6 mmol/L (ref 3.5–5.3)
Sodium: 139 mmol/L (ref 135–146)
Total Bilirubin: 1 mg/dL (ref 0.2–1.2)
Total Protein: 6.6 g/dL (ref 6.1–8.1)

## 2021-07-03 LAB — LIPID PANEL
Cholesterol: 205 mg/dL — ABNORMAL HIGH (ref <200)
HDL: 67 mg/dL (ref >=50)
LDL Cholesterol (Calc): 120 mg/dL (calc) — ABNORMAL HIGH
Non-HDL Cholesterol (Calc): 138 mg/dL (calc) — ABNORMAL HIGH (ref <130)
Total CHOL/HDL Ratio: 3.1 (calc) (ref <5.0)
Triglycerides: 82 mg/dL (ref <150)

## 2021-07-03 LAB — HEMOGLOBIN A1C
Hgb A1c MFr Bld: 6.4 % of total Hgb — ABNORMAL HIGH (ref <5.7)
Mean Plasma Glucose: 137 mg/dL
eAG (mmol/L): 7.6 mmol/L

## 2021-07-04 NOTE — Assessment & Plan Note (Addendum)
Chronic.  PHQ-9 and GAD-7 improved today however remain elevated.  No SI/HI.   We will continue duloxetine at 40 mg daily, continue BuSpar 7.5 mg twice daily, and continue to limit use of Valium-only when needed.  Referral placed once again for counseling.  Follow-up in 3 months.

## 2021-07-04 NOTE — Assessment & Plan Note (Signed)
Chronic.  Last A1c was in prediabetes range, which is great.  Patient has continued to lose weight on this medication-we will plan to continue.  We will check hemoglobin A1c today and recheck kidney function.  Follow-up 3 months.

## 2021-07-04 NOTE — Assessment & Plan Note (Signed)
Follows closely with neurosurgeon- has MRI scheduled in follow-up.  Continue Lyrica 300 mg twice daily along with tizanidine at night as needed.  Refills not needed today.  Continue collaboration with neurosurgeon.

## 2021-07-04 NOTE — Assessment & Plan Note (Signed)
Chronic.  PHQ-9 and GAD-7 improved today however remain elevated.  No SI/HI.   We will continue duloxetine at 40 mg daily, continue BuSpar 7.5 mg twice daily, and continue to limit use of Valium-only when needed.  Referral placed once again for counseling.  Follow-up in 3 months.

## 2021-07-04 NOTE — Assessment & Plan Note (Signed)
Currently maintained on high-dose vitamin D-we will plan to recheck vitamin D in September.  Should be taking 10,000 units weekly until then.

## 2021-07-04 NOTE — Assessment & Plan Note (Addendum)
Chronic.  Previously diet controlled-we will check lipids today-patient is fasting.  Continue low saturated fat diet including limit red meats, full fat dairy products, and fried foods.  Follow-up pending cholesterol levels.

## 2021-07-04 NOTE — Assessment & Plan Note (Signed)
Chronic.  Ambien refill not needed today.  Try to continue to limit use of this medication-use only when needed.  Follow-up in 3 months.

## 2021-07-07 ENCOUNTER — Other Ambulatory Visit: Payer: Self-pay | Admitting: Nurse Practitioner

## 2021-07-07 DIAGNOSIS — F5101 Primary insomnia: Secondary | ICD-10-CM

## 2021-07-07 DIAGNOSIS — G8929 Other chronic pain: Secondary | ICD-10-CM

## 2021-07-08 ENCOUNTER — Other Ambulatory Visit: Payer: No Typology Code available for payment source

## 2021-07-09 MED ORDER — TIZANIDINE HCL 2 MG PO CAPS
2.0000 mg | ORAL_CAPSULE | Freq: Three times a day (TID) | ORAL | 0 refills | Status: DC | PRN
Start: 2021-07-09 — End: 2021-08-13

## 2021-07-09 MED ORDER — ZOLPIDEM TARTRATE 10 MG PO TABS: 10.0000 mg | ORAL_TABLET | Freq: Every evening | ORAL | 0 refills | Status: DC | PRN

## 2021-07-09 NOTE — Telephone Encounter (Signed)
PDMP reviewed - patient is using sparingly and has a sleep study scheduled.  Will need appointment for further refills.

## 2021-07-19 ENCOUNTER — Other Ambulatory Visit: Payer: Self-pay

## 2021-07-19 ENCOUNTER — Ambulatory Visit
Admission: RE | Admit: 2021-07-19 | Discharge: 2021-07-19 | Disposition: A | Discharge status: Home / Self Care | Payer: No Typology Code available for payment source | Source: Ambulatory Visit | Attending: Specialist | Admitting: Specialist

## 2021-07-19 DIAGNOSIS — M5417 Radiculopathy, lumbosacral region: Secondary | ICD-10-CM

## 2021-07-22 ENCOUNTER — Encounter: Payer: Self-pay | Admitting: Pulmonary Disease

## 2021-08-04 ENCOUNTER — Telehealth: Payer: Self-pay | Admitting: Nurse Practitioner

## 2021-08-04 NOTE — Telephone Encounter (Signed)
Patient scheduled for a mychart video visit; complaining of breathlessness, exhaustion and other symptoms. Patient seen early July and meds prescribed - not helping with symptoms. Patient wants to know if she needs to get any labs done. Please advise at [phone]

## 2021-08-07 ENCOUNTER — Ambulatory Visit (INDEPENDENT_AMBULATORY_CARE_PROVIDER_SITE_OTHER): Payer: No Typology Code available for payment source

## 2021-08-07 ENCOUNTER — Other Ambulatory Visit: Payer: Self-pay

## 2021-08-07 ENCOUNTER — Encounter: Payer: Self-pay | Admitting: Nurse Practitioner

## 2021-08-07 ENCOUNTER — Telehealth (INDEPENDENT_AMBULATORY_CARE_PROVIDER_SITE_OTHER): Payer: No Typology Code available for payment source | Admitting: Nurse Practitioner

## 2021-08-07 DIAGNOSIS — R5383 Other fatigue: Secondary | ICD-10-CM

## 2021-08-07 DIAGNOSIS — R059 Cough, unspecified: Secondary | ICD-10-CM | POA: Diagnosis not present

## 2021-08-07 MED ORDER — PREDNISONE 10 MG PO TABS
10.0000 mg | ORAL_TABLET | Freq: Every day | ORAL | 0 refills | Status: DC
Start: 2021-08-07 — End: 2021-09-19

## 2021-08-07 MED ORDER — AMOXICILLIN 500 MG PO CAPS: 1000.0000 mg | ORAL_CAPSULE | Freq: Three times a day (TID) | ORAL | 0 refills | Status: AC

## 2021-08-07 NOTE — Progress Notes (Signed)
Subjective:    Patient ID: Sabrina Roberts, female    DOB: [birth year], 50 y.o.   MRN: [id number]  HPI: Sabrina Roberts is a 50 y.o. female presenting virtually for upper respiratory tract infection.  Chief Complaint  Patient presents with   Cough    Coughing, headache, denies fever, having fatigue. Onset wks ago, gets works at night. Taking mucinex, tylenol. Took covid test at home yesterday and in ER all neg.   UPPER RESPIRATORY TRACT INFECTION Onset: weeks COVID-19 testing history: tested negative multiple times COVID-19 vaccination status: Fever: no Hot flashes: yes Cough: yes; productive Shortness of breath: no; yes with activity Wheezing: no Chest pain: no Chest tightness: no Chest congestion: yes Nasal congestion: no Runny nose: no Post nasal drip: yes Sneezing: no Sore throat: no Swollen glands: no Sinus pressure: no Headache: no Face pain: no Toothache: no Ear pain: no  Ear pressure: no  Eyes red/itching:no Eye drainage/crusting: no  Nausea: yes  Vomiting: no Diarrhea: no  Change in appetite:  yes; appetite has been decreased   Loss of taste/smell: no  Rash: no Fatigue: yes Sick contacts: no Strep contacts: no  Context: stable Recurrent sinusitis: no Treatments attempted: cold and cough medicine, mucinex Relief with OTC medications:   Allergies  Allergen Reactions   Actos [Pioglitazone Hydrochloride] Anaphylaxis, Hives and Other (See Comments)    Chest pain    Latex Rash    Outpatient Encounter Medications as of 08/07/2021  Medication Sig   amoxicillin (AMOXIL) 500 MG capsule Take 2 capsules (1,000 mg total) by mouth every 8 (eight) hours for 5 days.   Blood Glucose Monitoring Suppl (BLOOD GLUCOSE SYSTEM PAK) KIT Please dispense based on patient and insurance preference.  Use as directed to monitor FSBS 1-2 times daily.  Dx E11.9   busPIRone (BUSPAR) 7.5 MG tablet Take 1 tablet (7.5 mg total) by mouth 2 (two) times daily.   Cholecalciferol  (VITAMIN D3) 250 MCG (10000 UT) capsule Take 1 capsule (10,000 Units total) by mouth daily.   diazepam (VALIUM) 5 MG tablet Take 0.5 tablets (2.5 mg total) by mouth daily as needed for anxiety.   Dulaglutide (TRULICITY) 1.5 HU/7.6LY SOPN Inject 1.5 mg into the skin once a week.   DULoxetine (CYMBALTA) 20 MG capsule Take 2 capsules (40 mg total) by mouth daily.   ferrous sulfate 325 (65 FE) MG tablet Take 325 mg by mouth daily with breakfast.   glucose blood test strip Please dispense based on patient and insurance preference.  Use as directed to monitor FSBS 1-2 times daily.  Dx E11.9   HYDROcodone-acetaminophen (NORCO) 5-325 MG tablet Take 1 tablet by mouth every 6 (six) hours as needed for moderate pain.   Lancets MISC Please dispense based on patient and insurance preference.  Use as directed to monitor FSBS 1-2 times daily.  Dx E11.9   Multiple Vitamin (MULTIVITAMIN) tablet Take 1 tablet by mouth daily.    predniSONE (DELTASONE) 10 MG tablet Take 1 tablet (10 mg total) by mouth daily with breakfast. Take 57m on days 1-2. Take 325mon days 3-4. Take 2068mn days 5-6. Take 33m43m days 7-8. Take 5mg 47mdays 9-10, then stop.   pregabalin (LYRICA) 300 MG capsule Take 1 capsule (300 mg total) by mouth 2 (two) times daily.   tizanidine (ZANAFLEX) 2 MG capsule Take 1 capsule (2 mg total) by mouth 3 (three) times daily as needed for muscle spasms. Take first dose at night time and monitor  for drowsiness.  Do not take while driving or operating heavy machinery if this medication makes you drowsy.   zolpidem (AMBIEN) 10 MG tablet Take 1 tablet (10 mg total) by mouth at bedtime as needed.   Facility-Administered Encounter Medications as of 08/07/2021  Medication   cyanocobalamin ((VITAMIN B-12)) injection 1,000 mcg    Patient Active Problem List   Diagnosis Date Noted   OSA (obstructive sleep apnea) 05/22/2021   Controlled substance agreement signed 05/01/2021   Panic attacks 09/10/2020   GAD  (generalized anxiety disorder) 05/03/2020   RLS (restless legs syndrome) 02/07/2020   Abnormal TSH 11/15/2018   Rectal bleeding 11/04/2017   Diarrhea 11/04/2017   Thoracic disc herniation 09/06/2017   Chronic back pain 09/06/2017   Anemia 09/06/2017   B12 deficiency 06/08/2017   Vitamin D deficiency 06/08/2017   MDD (major depressive disorder) 06/08/2017   Bulging of lumbar intervertebral disc 06/08/2017   Carpal tunnel syndrome of right wrist    Borderline abnormal TFTs 10/29/2014   Irregular menses 08/01/2013   Insomnia 10/14/2012   DM (diabetes mellitus) ([place]) 02/28/2012   Hyperlipidemia 02/28/2012   Class 3 obesity 02/28/2012   Thyroid nodule 02/28/2012    Past Medical History:  Diagnosis Date   Anemia    Anxiety    Arthritis    Depression    Diabetes mellitus    Hyperlipidemia    Hypertension    Obesity    Sleep apnea    Has CPAP but has lost weight and hasnt used    Relevant past medical, surgical, family and social history reviewed and updated as indicated. Interim medical history since our last visit reviewed.  Review of Systems Per HPI unless specifically indicated above     Objective:    LMP  (LMP Unknown)   Wt Readings from Last 3 Encounters:  07/02/21 232 lb 12.8 oz (105.6 kg)  06/28/21 235 lb (106.6 kg)  06/04/21 241 lb (109.3 kg)    Physical Exam Vitals and nursing note reviewed.  Constitutional:      General: She is not in acute distress.    Appearance: Normal appearance. She is not toxic-appearing.  Eyes:     General: No scleral icterus. Cardiovascular:     Comments: Unable to assess heart sounds via virtual visit Pulmonary:     Effort: Pulmonary effort is normal. No respiratory distress.     Comments: Unable to assess heart sounds via virtual visit. Patient talking in complete sentences during telemedicine visit without accessory muscle use. Skin:    Coloration: Skin is not jaundiced or pale.     Findings: No erythema.  Neurological:      Mental Status: She is alert and oriented to person, place, and time.  Psychiatric:        Mood and Affect: Mood normal.        Behavior: Behavior normal.        Thought Content: Thought content normal.        Judgment: Judgment normal.      Assessment & Plan:  1. Cough Given length of symptoms since onset, and persistence of symptoms, concern for community-acquired pneumonia.  Will start on amoxicillin 1000 mg 3 times daily for 5 days for empiric therapy.  Will obtain chest x-ray today.  Also start prednisone to help with inflammation and congestion.  Follow-up pending x-ray results.  - DG Chest 2 View; Future - amoxicillin (AMOXIL) 500 MG capsule; Take 2 capsules (1,000 mg total) by mouth every 8 (eight)  hours for 5 days.  Dispense: 30 capsule; Refill: 0 - predniSONE (DELTASONE) 10 MG tablet; Take 1 tablet (10 mg total) by mouth daily with breakfast. Take 63m on days 1-2. Take 354mon days 3-4. Take 2066mn days 5-6. Take 64m64m days 7-8. Take 5mg 11mdays 9-10, then stop.  Dispense: 21 tablet; Refill: 0  2. Fatigue, unspecified type  - DG Chest 2 View; Future    Follow up plan: Return if symptoms worsen or fail to improve.   Due to the catastrophic nature of the COVID-19 pandemic, this video visit was completed soley via audio and visual contact via Caregility due to the restrictions of the COVID-19 pandemic.  All issues as above were discussed and addressed. Physical exam was done as above through visual confirmation on Caregility. If it was felt that the patient should be evaluated in the office, they were directed there. The patient verbally consented to this visit. Location of the patient: home Location of the provider: work Those involved with this call:  Provider: JessiNoemi Chapel, FNP-C CMA: AnitrAnnabelle Harman [place]/Registration: JenniVevelyn Pate spent on call:  12 minutes with patient face to face via video conference. More than 50% of this time  was spent in counseling and coordination of care. 15 minutes total spent in review of patient's record and preparation of their chart. I verified patient identity using two factors (patient name and date of birth). Patient consents verbally to being seen via telemedicine visit today.

## 2021-08-11 ENCOUNTER — Other Ambulatory Visit: Payer: Self-pay | Admitting: Nurse Practitioner

## 2021-08-11 DIAGNOSIS — F411 Generalized anxiety disorder: Secondary | ICD-10-CM

## 2021-08-11 DIAGNOSIS — F331 Major depressive disorder, recurrent, moderate: Secondary | ICD-10-CM

## 2021-08-13 ENCOUNTER — Other Ambulatory Visit: Payer: Self-pay | Admitting: *Deleted

## 2021-08-13 DIAGNOSIS — G8929 Other chronic pain: Secondary | ICD-10-CM

## 2021-08-13 MED ORDER — TIZANIDINE HCL 2 MG PO CAPS
2.0000 mg | ORAL_CAPSULE | Freq: Three times a day (TID) | ORAL | 0 refills | Status: DC | PRN
Start: 2021-08-13 — End: 2023-03-22

## 2021-08-13 NOTE — Telephone Encounter (Signed)
Received fax requesting refill on Zanaflex.   Ok to refill? 

## 2021-08-15 ENCOUNTER — Encounter: Payer: Self-pay | Admitting: Nurse Practitioner

## 2021-08-15 MED ORDER — FLUCONAZOLE 150 MG PO TABS: 150.0000 mg | ORAL_TABLET | Freq: Once | ORAL | 0 refills | Status: AC

## 2021-08-25 ENCOUNTER — Other Ambulatory Visit: Payer: Self-pay | Admitting: *Deleted

## 2021-08-25 DIAGNOSIS — M5442 Lumbago with sciatica, left side: Secondary | ICD-10-CM

## 2021-08-25 DIAGNOSIS — G8929 Other chronic pain: Secondary | ICD-10-CM

## 2021-08-25 NOTE — Telephone Encounter (Signed)
Ok to refill 

## 2021-09-02 ENCOUNTER — Other Ambulatory Visit: Payer: Self-pay | Admitting: Nurse Practitioner

## 2021-09-02 DIAGNOSIS — F411 Generalized anxiety disorder: Secondary | ICD-10-CM

## 2021-09-02 NOTE — Telephone Encounter (Signed)
Ok to refill??  Last office visit 08/07/2021.  Last refill 07/02/2021.

## 2021-09-07 ENCOUNTER — Other Ambulatory Visit: Payer: Self-pay | Admitting: Nurse Practitioner

## 2021-09-07 DIAGNOSIS — F5101 Primary insomnia: Secondary | ICD-10-CM

## 2021-09-08 NOTE — Telephone Encounter (Signed)
Is this okay to fill? 

## 2021-09-08 NOTE — Telephone Encounter (Signed)
Ok to refill??  Last office visit 08/07/2021.  Last refill 07/09/2021

## 2021-09-18 ENCOUNTER — Other Ambulatory Visit: Payer: Self-pay | Admitting: Nurse Practitioner

## 2021-09-18 ENCOUNTER — Other Ambulatory Visit (HOSPITAL_COMMUNITY): Payer: Self-pay | Admitting: Nurse Practitioner

## 2021-09-18 DIAGNOSIS — F411 Generalized anxiety disorder: Secondary | ICD-10-CM

## 2021-09-18 DIAGNOSIS — Z1231 Encounter for screening mammogram for malignant neoplasm of breast: Secondary | ICD-10-CM

## 2021-09-18 DIAGNOSIS — F331 Major depressive disorder, recurrent, moderate: Secondary | ICD-10-CM

## 2021-09-19 ENCOUNTER — Other Ambulatory Visit: Payer: Self-pay

## 2021-09-19 ENCOUNTER — Telehealth (INDEPENDENT_AMBULATORY_CARE_PROVIDER_SITE_OTHER): Payer: 59 | Admitting: Nurse Practitioner

## 2021-09-19 VITALS — Wt 218.0 lb

## 2021-09-19 DIAGNOSIS — F331 Major depressive disorder, recurrent, moderate: Secondary | ICD-10-CM | POA: Diagnosis not present

## 2021-09-19 DIAGNOSIS — R61 Generalized hyperhidrosis: Secondary | ICD-10-CM

## 2021-09-19 DIAGNOSIS — E785 Hyperlipidemia, unspecified: Secondary | ICD-10-CM | POA: Diagnosis not present

## 2021-09-19 DIAGNOSIS — R0602 Shortness of breath: Secondary | ICD-10-CM | POA: Diagnosis not present

## 2021-09-19 DIAGNOSIS — R634 Abnormal weight loss: Secondary | ICD-10-CM

## 2021-09-19 DIAGNOSIS — F411 Generalized anxiety disorder: Secondary | ICD-10-CM

## 2021-09-19 MED ORDER — ATORVASTATIN CALCIUM 10 MG PO TABS: 10.0000 mg | ORAL_TABLET | Freq: Every day | ORAL | 1 refills | Status: DC

## 2021-09-19 MED ORDER — DULOXETINE HCL 20 MG PO CPEP: 60.0000 mg | ORAL_CAPSULE | Freq: Every day | ORAL | 1 refills | Status: DC

## 2021-09-19 NOTE — Progress Notes (Signed)
Subjective:    Patient ID: Sabrina Roberts, female    DOB: 13-Dec-1971, 50 y.o.   MRN: [id number]  HPI: Sabrina Roberts is a 50 y.o. female presenting virtually for uncontrolled mood.   Chief Complaint  Patient presents with   Panic Attack   DEPRESSION/PANIC ATTACKS Recently, she stopped taking Buspar.  Still taking Cymbalta 40 mg daily.  She tells me today she is under significant stress; her husband is in long-term rehabilitation and another family member recently got bad health news.  She is having hot flashes every day and wakes up with panic attacks in the middle of the night.  She is crying uncontrollably.  She has started seeing a counselor through her employer. She tells me yesterday was such a bad day and it reminded her of how she was feeling around this time last year when she was at an all-time low.  Also tells me she has been more forgetful.  She denies suicidal ideation and does not have a plan to harm herself today. Mood status: uncontrolled Satisfied with current treatment?: no Symptom severity: severe  Duration of current treatment : years Side effects: no Psychotherapy/counseling: yes Depressed mood: yes Anxious mood: yes Anhedonia: yes Significant weight loss or gain: yes; weight loss Insomnia: yes Fatigue: yes Feelings of worthlessness or guilt: yes Impaired concentration/indecisiveness: yes Suicidal ideations: no Hopelessness: yes Crying spells: yes  She also tells me her shortness of breath has been worsening.  She is not having any fever.  She reports with her normal activity, she has to stop to take breaks multiple times to catch her breath.  She has also had good success with Trulicity and has lost ~17 lbs since our last visit in July. She is happy about this but is worried if this is too much weight loss too fast.   She also asks about her cholesterol today and is wondering if she should be on a medication to help with that.   Allergies  Allergen  Reactions   Actos [Pioglitazone Hydrochloride] Anaphylaxis, Hives and Other (See Comments)    Chest pain    Latex Rash    Outpatient Encounter Medications as of 09/19/2021  Medication Sig   atorvastatin (LIPITOR) 10 MG tablet Take 1 tablet (10 mg total) by mouth daily.   Blood Glucose Monitoring Suppl (BLOOD GLUCOSE SYSTEM PAK) KIT Please dispense based on patient and insurance preference.  Use as directed to monitor FSBS 1-2 times daily.  Dx E11.9   Cholecalciferol (VITAMIN D3) 250 MCG (10000 UT) capsule Take 1 capsule (10,000 Units total) by mouth daily.   diazepam (VALIUM) 5 MG tablet Take 0.5 tablets (2.5 mg total) by mouth daily as needed for anxiety.   Dulaglutide (TRULICITY) 1.5 BL/3.9QZ SOPN Inject 1.5 mg into the skin once a week.   DULoxetine (CYMBALTA) 20 MG capsule Take 3 capsules (60 mg total) by mouth daily.   ferrous sulfate 325 (65 FE) MG tablet Take 325 mg by mouth daily with breakfast.   glucose blood test strip Please dispense based on patient and insurance preference.  Use as directed to monitor FSBS 1-2 times daily.  Dx E11.9   HYDROcodone-acetaminophen (NORCO) 5-325 MG tablet Take 1 tablet by mouth every 6 (six) hours as needed for moderate pain.   Lancets MISC Please dispense based on patient and insurance preference.  Use as directed to monitor FSBS 1-2 times daily.  Dx E11.9   Multiple Vitamin (MULTIVITAMIN) tablet Take 1 tablet by mouth daily.  pregabalin (LYRICA) 300 MG capsule Take 1 capsule (300 mg total) by mouth 2 (two) times daily.   tizanidine (ZANAFLEX) 2 MG capsule Take 1 capsule (2 mg total) by mouth 3 (three) times daily as needed for muscle spasms. Take first dose at night time and monitor for drowsiness.  Do not take while driving or operating heavy machinery if this medication makes you drowsy.   zolpidem (AMBIEN) 10 MG tablet Take 1 tablet (10 mg total) by mouth at bedtime as needed.   [DISCONTINUED] busPIRone (BUSPAR) 7.5 MG tablet Take 1 tablet (7.5  mg total) by mouth 2 (two) times daily.   [DISCONTINUED] DULoxetine (CYMBALTA) 20 MG capsule Take 2 capsules by mouth once daily   [DISCONTINUED] predniSONE (DELTASONE) 10 MG tablet Take 1 tablet (10 mg total) by mouth daily with breakfast. Take 61m on days 1-2. Take 339mon days 3-4. Take 2029mn days 5-6. Take 78m18m days 7-8. Take 5mg 55mdays 9-10, then stop.   Facility-Administered Encounter Medications as of 09/19/2021  Medication   cyanocobalamin ((VITAMIN B-12)) injection 1,000 mcg    Patient Active Problem List   Diagnosis Date Noted   OSA (obstructive sleep apnea) 05/22/2021   Controlled substance agreement signed 05/01/2021   Panic attacks 09/10/2020   GAD (generalized anxiety disorder) 05/03/2020   RLS (restless legs syndrome) 02/07/2020   Abnormal TSH 11/15/2018   Rectal bleeding 11/04/2017   Diarrhea 11/04/2017   Thoracic disc herniation 09/06/2017   Chronic back pain 09/06/2017   Anemia 09/06/2017   B12 deficiency 06/08/2017   Vitamin D deficiency 06/08/2017   MDD (major depressive disorder) 06/08/2017   Bulging of lumbar intervertebral disc 06/08/2017   Carpal tunnel syndrome of right wrist    Borderline abnormal TFTs 10/29/2014   Irregular menses 08/01/2013   Insomnia 10/14/2012   DM (diabetes mellitus) ([facility]) [place]03/2013   Hyperlipidemia 02/28/2012   Class 3 obesity 02/28/2012   Thyroid nodule 02/28/2012    Past Medical History:  Diagnosis Date   Anemia    Anxiety    Arthritis    Depression    Diabetes mellitus    Hyperlipidemia    Hypertension    Obesity    Sleep apnea    Has CPAP but has lost weight and hasnt used    Relevant past medical, surgical, family and social history reviewed and updated as indicated. Interim medical history since our last visit reviewed.  Review of Systems  Constitutional:  Positive for diaphoresis. Negative for activity change, appetite change, chills and fatigue.  Respiratory:  Positive for shortness of breath.  Negative for cough and wheezing.   Neurological:  Positive for dizziness.  Per HPI unless specifically indicated above     Objective:    Wt 218 lb (98.9 kg)   LMP  (LMP Unknown)   BMI 37.42 kg/m   Wt Readings from Last 3 Encounters:  09/22/21 219 lb (99.3 kg)  09/19/21 218 lb (98.9 kg)  07/02/21 232 lb 12.8 oz (105.6 kg)    Physical Exam Vitals and nursing note reviewed.  Constitutional:      General: She is not in acute distress.    Appearance: Normal appearance. She is not toxic-appearing.  HENT:     Head: Normocephalic and atraumatic.  Eyes:     General: No scleral icterus.    Extraocular Movements: Extraocular movements intact.  Pulmonary:     Effort: Pulmonary effort is normal. No respiratory distress.     Comments: Unable to assess lung sounds  via virtual visit.  Patient talking in complete sentences during telemedicine visit without accessory muscle use. Skin:    Coloration: Skin is not jaundiced or pale.     Findings: No erythema.  Neurological:     Mental Status: She is alert and oriented to person, place, and time.  Psychiatric:        Attention and Perception: Attention normal.        Mood and Affect: Mood is anxious. Affect is labile and tearful.        Speech: Speech normal.        Behavior: Behavior normal. Behavior is cooperative.        Thought Content: Thought content normal. Thought content does not include homicidal or suicidal ideation. Thought content does not include homicidal or suicidal plan.        Cognition and Memory: Cognition normal.        Judgment: Judgment normal.      Assessment & Plan:   Problem List Items Addressed This Visit       Other   MDD (major depressive disorder) - Primary    Chronic.  PHQ-9 and GAD-7 not done today, however I suspect if we had done them they would be significantly elevated.  She denies SI and HI denies suicidal plan today.  We will plan to increase duloxetine to 60 mg daily.  I have placed an urgent  referral to psychiatry.  We discussed walk-in behavioral health options that are available to her if her mood acutely worsens before she can get into psychiatry.      Relevant Medications   DULoxetine (CYMBALTA) 20 MG capsule   Other Relevant Orders   Ambulatory referral to Psychiatry   Hyperlipidemia    Discussed LDL elevated above goal of less than 70 and with history of diabetes, previously recommended stating a statin.  Patient agreeable today.  Atorvastatin 10 mg daily sent into pharmacy.  Follow up 3 months.      Relevant Medications   atorvastatin (LIPITOR) 10 MG tablet   GAD (generalized anxiety disorder)    Chronic.  PHQ-9 and GAD-7 not done today, however I suspect if we had done them they would be significantly elevated.  She denies SI and HI denies suicidal plan today.  We will plan to increase duloxetine to 60 mg daily.  I have placed an urgent referral to psychiatry.  We discussed walk-in behavioral health options that are available to her if her mood acutely worsens before she can get into psychiatry.      Relevant Medications   DULoxetine (CYMBALTA) 20 MG capsule   Other Relevant Orders   Ambulatory referral to Psychiatry   Other Visit Diagnoses     Shortness of breath       Night sweats           Shortness of breath Acute.  Unclear etiology and difficult to determine today during telemedicine visit.  Some concern given weight loss, and night sweats however patient is on Trulicity for diabetes.  She does have uncontrolled anxiety and panic disorder at this time, however I do not think this is likely playing a role because the shortness of breath started prior to her mood worsening.  I have asked her to schedule an in person follow-up with me for the next week or so to check blood work including D-dimer, blood counts, thyroid hormone.  Night sweats Acute.  Unclear etiology and difficult to determine today during telemedicine visit.  Some concern given  weight loss, and  night sweats however patient is on Trulicity for diabetes.  She does have uncontrolled anxiety and panic disorder at this time, however I do not think this is likely playing a role because the shortness of breath started prior to her mood worsening.  I have asked her to schedule an in person follow-up with me for the next week or so to check blood work including D-dimer, blood counts, thyroid hormone.   Follow up plan: Return in about 2 weeks (around 10/03/2021) for shortness of breath follow up.  Due to the catastrophic nature of the COVID-19 pandemic, this video visit was completed soley via audio and visual contact via Caregility due to the restrictions of the COVID-19 pandemic.  All issues as above were discussed and addressed. Physical exam was done as above through visual confirmation on Caregility. If it was felt that the patient should be evaluated in the office, they were directed there. The patient verbally consented to this visit. Location of the patient: home Location of the provider: work Those involved with this call:  Provider: Noemi Chapel, DNP, FNP-C CMA: n/a Front Desk/Registration: Vevelyn Pat  Time spent on call:  24 minutes with patient face to face via video conference. More than 50% of this time was spent in counseling and coordination of care. 30 minutes total spent in review of patient's record and preparation of their chart. I verified patient identity using two factors (patient name and date of birth). Patient consents verbally to being seen via telemedicine visit today.

## 2021-09-22 ENCOUNTER — Other Ambulatory Visit: Payer: Self-pay

## 2021-09-22 ENCOUNTER — Other Ambulatory Visit: Payer: Self-pay | Admitting: *Deleted

## 2021-09-22 ENCOUNTER — Inpatient Hospital Stay (HOSPITAL_COMMUNITY): Admission: RE | Admit: 2021-09-22 | Payer: No Typology Code available for payment source | Source: Ambulatory Visit

## 2021-09-22 ENCOUNTER — Encounter (HOSPITAL_COMMUNITY): Payer: Self-pay

## 2021-09-22 ENCOUNTER — Emergency Department (HOSPITAL_COMMUNITY)
Admission: EM | Admit: 2021-09-22 | Discharge: 2021-09-22 | Disposition: A | Discharge status: Home / Self Care | Payer: 59 | Attending: Emergency Medicine | Admitting: Emergency Medicine

## 2021-09-22 DIAGNOSIS — S0501XA Injury of conjunctiva and corneal abrasion without foreign body, right eye, initial encounter: Secondary | ICD-10-CM | POA: Insufficient documentation

## 2021-09-22 DIAGNOSIS — E119 Type 2 diabetes mellitus without complications: Secondary | ICD-10-CM | POA: Diagnosis not present

## 2021-09-22 DIAGNOSIS — Z91040 Latex allergy status: Secondary | ICD-10-CM | POA: Diagnosis not present

## 2021-09-22 DIAGNOSIS — I10 Essential (primary) hypertension: Secondary | ICD-10-CM | POA: Diagnosis not present

## 2021-09-22 DIAGNOSIS — Z79899 Other long term (current) drug therapy: Secondary | ICD-10-CM | POA: Diagnosis not present

## 2021-09-22 DIAGNOSIS — H18821 Corneal disorder due to contact lens, right eye: Secondary | ICD-10-CM

## 2021-09-22 DIAGNOSIS — Z794 Long term (current) use of insulin: Secondary | ICD-10-CM | POA: Insufficient documentation

## 2021-09-22 DIAGNOSIS — X58XXXA Exposure to other specified factors, initial encounter: Secondary | ICD-10-CM | POA: Insufficient documentation

## 2021-09-22 DIAGNOSIS — F5101 Primary insomnia: Secondary | ICD-10-CM

## 2021-09-22 DIAGNOSIS — S0591XA Unspecified injury of right eye and orbit, initial encounter: Secondary | ICD-10-CM | POA: Diagnosis present

## 2021-09-22 MED ORDER — FLUORESCEIN SODIUM 1 MG OP STRP
1.0000 | ORAL_STRIP | Freq: Once | OPHTHALMIC | Status: AC
  Administered 2021-09-22: 1 via OPHTHALMIC
  Filled 2021-09-22: qty 1

## 2021-09-22 MED ORDER — TETRACAINE HCL 0.5 % OP SOLN
2.0000 [drp] | Freq: Once | OPHTHALMIC | Status: AC
  Administered 2021-09-22: 2 [drp] via OPHTHALMIC
  Filled 2021-09-22: qty 4

## 2021-09-22 MED ORDER — TOBRAMYCIN 0.3 % OP SOLN
2.0000 [drp] | OPHTHALMIC | Status: DC
  Administered 2021-09-22: 2 [drp] via OPHTHALMIC
  Filled 2021-09-22: qty 5

## 2021-09-22 NOTE — ED Notes (Signed)
Visual Acuity w/ glasses on Both eyes: 20/25                                              Left eye :  20/25                                              Right eye: 20/50

## 2021-09-22 NOTE — ED Provider Notes (Signed)
[facility] EMERGENCY DEPARTMENT Provider Note   CSN: [id number] Arrival date & time: 09/22/21  1339     History Chief Complaint  Patient presents with   Eye Problem    PEJA ALLENDER is a 50 y.o. female.  Patient states she use contacts and her right eye started hurting 2 days ago.  She is not wearing contacts now  The history is provided by the patient and medical records. No language interpreter was used.  Eye Problem Location:  Right eye Quality:  Aching Severity:  Moderate Onset quality:  Sudden Timing:  Constant Progression:  Waxing and waning Chronicity:  New Context: not burn   Relieved by:  Nothing Worsened by:  Nothing Associated symptoms: no discharge and no headaches       Past Medical History:  Diagnosis Date   Anemia    Anxiety    Arthritis    Depression    Diabetes mellitus    Hyperlipidemia    Hypertension    Obesity    Sleep apnea    Has CPAP but has lost weight and hasnt used    Patient Active Problem List   Diagnosis Date Noted   OSA (obstructive sleep apnea) 05/22/2021   Controlled substance agreement signed 05/01/2021   Panic attacks 09/10/2020   GAD (generalized anxiety disorder) 05/03/2020   RLS (restless legs syndrome) 02/07/2020   Abnormal TSH 11/15/2018   Rectal bleeding 11/04/2017   Diarrhea 11/04/2017   Thoracic disc herniation 09/06/2017   Chronic back pain 09/06/2017   Anemia 09/06/2017   B12 deficiency 06/08/2017   Vitamin D deficiency 06/08/2017   MDD (major depressive disorder) 06/08/2017   Bulging of lumbar intervertebral disc 06/08/2017   Carpal tunnel syndrome of right wrist    Borderline abnormal TFTs 10/29/2014   Irregular menses 08/01/2013   Insomnia 10/14/2012   DM (diabetes mellitus) ([place]) 02/28/2012   Hyperlipidemia 02/28/2012   Class 3 obesity 02/28/2012   Thyroid nodule 02/28/2012    Past Surgical History:  Procedure Laterality Date   CARPAL TUNNEL RELEASE Right 10/01/2016   Procedure: RIGHT  CARPAL TUNNEL RELEASE;  Surgeon: Carole Civil, MD;  Location: [facility];  Service: Orthopedics;  Laterality: Right;   CARPAL TUNNEL RELEASE Left 10/22/2016   Procedure: CARPAL TUNNEL RELEASE;  Surgeon: Carole Civil, MD;  Location: [facility];  Service: Orthopedics;  Laterality: Left;   CESAREAN SECTION  2002   CHOLECYSTECTOMY  dec 2012   GASTRIC BYPASS  dec 2012   KNEE ARTHROSCOPY Right      OB History   No obstetric history on file.     Family History  Problem Relation Age of Onset   Hypertension Mother    Depression Mother    Diabetes Mother    Irritable bowel syndrome Mother    Heart disease Mother    Stroke Mother    Hypertension Father    Hyperlipidemia Father    Prostate cancer Father    Colon polyps Father    Heart disease Father    Kidney Stones Father    Diverticulitis Father    Colon cancer Father    Breast cancer Paternal Grandmother    Colon cancer Paternal Grandfather    Diabetes Maternal Grandmother    Breast cancer Maternal Aunt    Stomach cancer Paternal Uncle    Colon cancer Other        Paternal Great Uncles x 4   Colon polyps Paternal Aunt        x  3    Social History   Tobacco Use   Smoking status: Never   Smokeless tobacco: Never  Vaping Use   Vaping Use: Never used  Substance Use Topics   Alcohol use: Yes    Alcohol/week: 0.0 standard drinks    Comment: occasionally   Drug use: No    Home Medications Prior to Admission medications   Medication Sig Start Date End Date Taking? Authorizing Provider  atorvastatin (LIPITOR) 10 MG tablet Take 1 tablet (10 mg total) by mouth daily. 09/19/21   Eulogio Bear, NP  Blood Glucose Monitoring Suppl (BLOOD GLUCOSE SYSTEM PAK) KIT Please dispense based on patient and insurance preference.  Use as directed to monitor FSBS 1-2 times daily.  Dx E11.9 05/01/21   Eulogio Bear, NP  Cholecalciferol (VITAMIN D3) 250 MCG (10000 UT) capsule Take 1 capsule (10,000 Units total) by mouth daily.  03/21/21   Eulogio Bear, NP  diazepam (VALIUM) 5 MG tablet Take 0.5 tablets (2.5 mg total) by mouth daily as needed for anxiety. 07/02/21   Eulogio Bear, NP  Dulaglutide (TRULICITY) 1.5 PJ/8.2NK SOPN Inject 1.5 mg into the skin once a week. 07/02/21   Eulogio Bear, NP  DULoxetine (CYMBALTA) 20 MG capsule Take 3 capsules (60 mg total) by mouth daily. 09/19/21   Eulogio Bear, NP  ferrous sulfate 325 (65 FE) MG tablet Take 325 mg by mouth daily with breakfast.    [provider]  glucose blood test strip Please dispense based on patient and insurance preference.  Use as directed to monitor FSBS 1-2 times daily.  Dx E11.9 05/01/21   Eulogio Bear, NP  HYDROcodone-acetaminophen (NORCO) 5-325 MG tablet Take 1 tablet by mouth every 6 (six) hours as needed for moderate pain. 05/03/20   Alycia Rossetti, MD  Lancets MISC Please dispense based on patient and insurance preference.  Use as directed to monitor FSBS 1-2 times daily.  Dx E11.9 05/01/21   Eulogio Bear, NP  Multiple Vitamin (MULTIVITAMIN) tablet Take 1 tablet by mouth daily.     [provider]  pregabalin (LYRICA) 300 MG capsule Take 1 capsule (300 mg total) by mouth 2 (two) times daily. 05/01/21   Eulogio Bear, NP  tizanidine (ZANAFLEX) 2 MG capsule Take 1 capsule (2 mg total) by mouth 3 (three) times daily as needed for muscle spasms. Take first dose at night time and monitor for drowsiness.  Do not take while driving or operating heavy machinery if this medication makes you drowsy. 08/13/21   Eulogio Bear, NP  zolpidem (AMBIEN) 10 MG tablet Take 1 tablet (10 mg total) by mouth at bedtime as needed. 07/09/21   Eulogio Bear, NP    Allergies    Actos [pioglitazone hydrochloride] and Latex  Review of Systems   Review of Systems  Constitutional:  Negative for appetite change and fatigue.  HENT:  Negative for congestion, ear discharge and sinus pressure.   Eyes:  Positive for pain.  Negative for discharge.  Respiratory:  Negative for cough.   Cardiovascular:  Negative for chest pain.  Gastrointestinal:  Negative for abdominal pain and diarrhea.  Genitourinary:  Negative for frequency and hematuria.  Musculoskeletal:  Negative for back pain.  Skin:  Negative for rash.  Neurological:  Negative for seizures and headaches.  Psychiatric/Behavioral:  Negative for hallucinations.    Physical Exam Updated Vital Signs BP 127/80 (BP Location: Right Arm)   Pulse 83   Temp 97.8  F (36.6 C) (Temporal)   Resp 18   Ht _0  (1.626 m)   Wt 99.3 kg   LMP  (LMP Unknown)   SpO2 99%   BMI 37.59 kg/m   Physical Exam Vitals and nursing note reviewed.  Constitutional:      Appearance: She is well-developed.  HENT:     Head: Normocephalic.     Nose: Nose normal.  Eyes:     General: No scleral icterus.    Conjunctiva/sclera: Conjunctivae normal.     Comments: Fluorescein stain is positive for corneal abrasion approximately middle part of cornea  Neck:     Thyroid: No thyromegaly.  Cardiovascular:     Rate and Rhythm: Normal rate and regular rhythm.     Heart sounds: No murmur heard.   No friction rub. No gallop.  Pulmonary:     Breath sounds: No stridor. No wheezing or rales.  Chest:     Chest wall: No tenderness.  Abdominal:     General: There is no distension.     Tenderness: There is no abdominal tenderness. There is no rebound.  Musculoskeletal:        General: Normal range of motion.     Cervical back: Neck supple.  Lymphadenopathy:     Cervical: No cervical adenopathy.  Skin:    Findings: No erythema or rash.  Neurological:     Mental Status: She is alert and oriented to person, place, and time.     Motor: No abnormal muscle tone.     Coordination: Coordination normal.  Psychiatric:        Behavior: Behavior normal.    ED Results / Procedures / Treatments   Labs (all labs ordered are listed, but only abnormal results are displayed) Labs Reviewed -  No data to display  EKG None  Radiology No results found.  Procedures Procedures   Medications Ordered in ED Medications  tobramycin (TOBREX) 0.3 % ophthalmic solution 2 drop (2 drops Right Eye Given 09/22/21 1644)  tetracaine (PONTOCAINE) 0.5 % ophthalmic solution 2 drop (2 drops Right Eye Given by Other 09/22/21 1642)  fluorescein ophthalmic strip 1 strip (1 strip Right Eye Given by Other 09/22/21 1642)    ED Course  I have reviewed the triage vital signs and the nursing notes.  Pertinent labs & imaging results that were available during my care of the patient were reviewed by me and considered in my medical decision making (see chart for details).    MDM Rules/Calculators/A&P                           Patient with a corneal abrasion right in the middle of her right eye.  It is a small abrasion.  She is put on tobramycin drops and will see the ophthalmologist tomorrow Final Clinical Impression(s) / ED Diagnoses Final diagnoses:  Corneal abrasion of right eye due to contact lens    Rx / DC Orders ED Discharge Orders     None        Milton Ferguson, MD 09/22/21 2303

## 2021-09-22 NOTE — Discharge Instructions (Addendum)
Follow-up with Dr. Alanda Slim at [address], [place].  Your appointment is at 1240.  Make sure you bring your insurance card.  Use those eyedrops every 2 hours while you are awake and take Tylenol for pain

## 2021-09-22 NOTE — Telephone Encounter (Signed)
Received fax requesting refill on Ambien.   Ok to refill??  Last office visit 09/19/2021.  Last refill 07/09/2021.

## 2021-09-22 NOTE — ED Triage Notes (Signed)
Pt presents to ED with complaints of right eye pain. Pt states she thinks she scratched her eye taking her contacts out, eye is sensitive to light, draining, red and irritated. Happened yesterday.

## 2021-09-23 ENCOUNTER — Encounter: Payer: Self-pay | Admitting: Nurse Practitioner

## 2021-09-23 NOTE — Assessment & Plan Note (Signed)
Chronic.  PHQ-9 and GAD-7 not done today, however I suspect if we had done them they would be significantly elevated.  She denies SI and HI denies suicidal plan today.  We will plan to increase duloxetine to 60 mg daily.  I have placed an urgent referral to psychiatry.  We discussed walk-in behavioral health options that are available to her if her mood acutely worsens before she can get into psychiatry.

## 2021-09-23 NOTE — Assessment & Plan Note (Signed)
Discussed LDL elevated above goal of less than 70 and with history of diabetes, previously recommended stating a statin.  Patient agreeable today.  Atorvastatin 10 mg daily sent into pharmacy.  Follow up 3 months.

## 2021-09-24 ENCOUNTER — Ambulatory Visit (HOSPITAL_COMMUNITY): Payer: 59

## 2021-09-24 ENCOUNTER — Encounter: Payer: 59 | Admitting: Gastroenterology

## 2021-09-24 ENCOUNTER — Encounter: Payer: Self-pay | Admitting: Gastroenterology

## 2021-10-10 ENCOUNTER — Ambulatory Visit: Payer: 59 | Admitting: Nurse Practitioner

## 2021-10-12 ENCOUNTER — Encounter (HOSPITAL_COMMUNITY): Payer: Self-pay | Admitting: *Deleted

## 2021-10-12 ENCOUNTER — Emergency Department (HOSPITAL_COMMUNITY)
Admission: EM | Admit: 2021-10-12 | Discharge: 2021-10-12 | Disposition: A | Discharge status: Home / Self Care | Payer: 59 | Attending: Emergency Medicine | Admitting: Emergency Medicine

## 2021-10-12 ENCOUNTER — Other Ambulatory Visit: Payer: Self-pay

## 2021-10-12 DIAGNOSIS — Z794 Long term (current) use of insulin: Secondary | ICD-10-CM | POA: Diagnosis not present

## 2021-10-12 DIAGNOSIS — Z7722 Contact with and (suspected) exposure to environmental tobacco smoke (acute) (chronic): Secondary | ICD-10-CM | POA: Diagnosis not present

## 2021-10-12 DIAGNOSIS — E119 Type 2 diabetes mellitus without complications: Secondary | ICD-10-CM | POA: Diagnosis not present

## 2021-10-12 DIAGNOSIS — Z91040 Latex allergy status: Secondary | ICD-10-CM | POA: Insufficient documentation

## 2021-10-12 DIAGNOSIS — X58XXXA Exposure to other specified factors, initial encounter: Secondary | ICD-10-CM | POA: Diagnosis not present

## 2021-10-12 DIAGNOSIS — T1502XA Foreign body in cornea, left eye, initial encounter: Secondary | ICD-10-CM | POA: Insufficient documentation

## 2021-10-12 DIAGNOSIS — H5712 Ocular pain, left eye: Secondary | ICD-10-CM

## 2021-10-12 MED ORDER — FLUORESCEIN SODIUM 1 MG OP STRP
1.0000 | ORAL_STRIP | Freq: Once | OPHTHALMIC | Status: AC
  Administered 2021-10-12: 1 via OPHTHALMIC
  Filled 2021-10-12: qty 1

## 2021-10-12 MED ORDER — HYDROCODONE-ACETAMINOPHEN 5-325 MG PO TABS: 1.0000 | ORAL_TABLET | Freq: Four times a day (QID) | ORAL | 0 refills | Status: DC | PRN

## 2021-10-12 MED ORDER — PHENYLEPHRINE HCL 2.5 % OP SOLN: 1.0000 [drp] | Freq: Once | OPHTHALMIC | Status: DC

## 2021-10-12 MED ORDER — OXYCODONE-ACETAMINOPHEN 5-325 MG PO TABS
2.0000 | ORAL_TABLET | Freq: Once | ORAL | Status: DC
Start: 2021-10-12 — End: 2021-10-12

## 2021-10-12 MED ORDER — OXYCODONE-ACETAMINOPHEN 5-325 MG PO TABS
1.0000 | ORAL_TABLET | Freq: Once | ORAL | Status: AC
  Administered 2021-10-12: 1 via ORAL
  Filled 2021-10-12: qty 1

## 2021-10-12 MED ORDER — MOXIFLOXACIN HCL 0.5 % OP SOLN: 1.0000 [drp] | Freq: Four times a day (QID) | OPHTHALMIC | 0 refills | Status: DC

## 2021-10-12 MED ORDER — PHENYLEPHRINE HCL 2.5 % OP SOLN
1.0000 [drp] | Freq: Once | OPHTHALMIC | Status: AC
  Administered 2021-10-12: 1 [drp] via OPHTHALMIC
  Filled 2021-10-12: qty 15

## 2021-10-12 MED ORDER — TETRACAINE HCL 0.5 % OP SOLN
2.0000 [drp] | Freq: Once | OPHTHALMIC | Status: AC
  Administered 2021-10-12: 2 [drp] via OPHTHALMIC
  Filled 2021-10-12: qty 4

## 2021-10-12 NOTE — ED Triage Notes (Signed)
Pain in left eye, states she may have a contact stuck in her eye, states eye is sensitive to light

## 2021-10-12 NOTE — Discharge Instructions (Addendum)
DO NOT use your contact lenses. GO TO Dr. Eulas Post tomorrow morning at 10:30 AM Wear a patch or dark glasses. Use the eye drop 4 times a day.  Contact a health care provider if: You have more pain in your eye. You have problems with your eye shield. You have abnormal fluid (discharge) coming from your eye. Get help right away if: Your vision gets worse. You have more redness and swelling in or around your eye.

## 2021-10-12 NOTE — ED Provider Notes (Signed)
[facility] Provider Note   CSN: [id number] Arrival date & time: 10/12/21  0944     History Chief Complaint  Patient presents with   Eye Problem    Sabrina Roberts is a 50 y.o. female.   Eye Problem Location:  Left eye Quality:  Aching, stabbing, tearing, burning and foreign body sensation Severity:  Severe Onset quality:  Gradual Duration:  24 hours Timing:  Constant Progression:  Worsening Chronicity:  New Context: contact lenses and foreign body   Context: not burn, not chemical exposure, not direct trauma, not using machinery, not scratch and not smoke exposure   Foreign body: contact lens. Relieved by:  Nothing Worsened by:  Bright light Ineffective treatments:  NSAIDs, sunglasses, darkened room and closing eye Associated symptoms: blurred vision, headaches, photophobia, redness and tearing   Associated symptoms: no crusting, no decreased vision, no discharge, no double vision, no facial rash, no inflammation, no itching, no nausea, no numbness, no scotomas, no swelling, no tingling, no vomiting and no weakness   Risk factors: no conjunctival hemorrhage, no exposure to pinkeye, no previous injury to eye, no recent herpes zoster and no recent URI       Past Medical History:  Diagnosis Date   Anemia    Anxiety    Arthritis    Depression    Diabetes mellitus    Hyperlipidemia    Hypertension    Obesity    Sleep apnea    Has CPAP but has lost weight and hasnt used    Patient Active Problem List   Diagnosis Date Noted   OSA (obstructive sleep apnea) 05/22/2021   Controlled substance agreement signed 05/01/2021   Panic attacks 09/10/2020   GAD (generalized anxiety disorder) 05/03/2020   RLS (restless legs syndrome) 02/07/2020   Abnormal TSH 11/15/2018   Rectal bleeding 11/04/2017   Diarrhea 11/04/2017   Thoracic disc herniation 09/06/2017   Chronic back pain 09/06/2017   Anemia 09/06/2017   B12 deficiency 06/08/2017   Vitamin D  deficiency 06/08/2017   MDD (major depressive disorder) 06/08/2017   Bulging of lumbar intervertebral disc 06/08/2017   Carpal tunnel syndrome of right wrist    Borderline abnormal TFTs 10/29/2014   Irregular menses 08/01/2013   Insomnia 10/14/2012   DM (diabetes mellitus) ([place]) 02/28/2012   Hyperlipidemia 02/28/2012   Class 3 obesity ([place]) 02/28/2012   Thyroid nodule 02/28/2012    Past Surgical History:  Procedure Laterality Date   CARPAL TUNNEL RELEASE Right 10/01/2016   Procedure: RIGHT CARPAL TUNNEL RELEASE;  Surgeon: Carole Civil, MD;  Location: [facility];  Service: Orthopedics;  Laterality: Right;   CARPAL TUNNEL RELEASE Left 10/22/2016   Procedure: CARPAL TUNNEL RELEASE;  Surgeon: Carole Civil, MD;  Location: [facility];  Service: Orthopedics;  Laterality: Left;   CESAREAN SECTION  2002   CHOLECYSTECTOMY  dec 2012   GASTRIC BYPASS  dec 2012   KNEE ARTHROSCOPY Right      OB History   No obstetric history on file.     Family History  Problem Relation Age of Onset   Hypertension Mother    Depression Mother    Diabetes Mother    Irritable bowel syndrome Mother    Heart disease Mother    Stroke Mother    Hypertension Father    Hyperlipidemia Father    Prostate cancer Father    Colon polyps Father    Heart disease Father    Kidney Stones Father    Diverticulitis  Father    Colon cancer Father    Breast cancer Paternal Grandmother    Colon cancer Paternal Grandfather    Diabetes Maternal Grandmother    Breast cancer Maternal Aunt    Stomach cancer Paternal Uncle    Colon cancer Other        Paternal Great Uncles x 4   Colon polyps Paternal Aunt        x 3    Social History   Tobacco Use   Smoking status: Never    Passive exposure: Current   Smokeless tobacco: Never  Vaping Use   Vaping Use: Never used  Substance Use Topics   Alcohol use: Yes    Alcohol/week: 0.0 standard drinks    Comment: occasionally   Drug use: No    Home  Medications Prior to Admission medications   Medication Sig Start Date End Date Taking? Authorizing Provider  atorvastatin (LIPITOR) 10 MG tablet Take 1 tablet (10 mg total) by mouth daily. 09/19/21   Eulogio Bear, NP  Blood Glucose Monitoring Suppl (BLOOD GLUCOSE SYSTEM PAK) KIT Please dispense based on patient and insurance preference.  Use as directed to monitor FSBS 1-2 times daily.  Dx E11.9 05/01/21   Eulogio Bear, NP  Cholecalciferol (VITAMIN D3) 250 MCG (10000 UT) capsule Take 1 capsule (10,000 Units total) by mouth daily. 03/21/21   Eulogio Bear, NP  diazepam (VALIUM) 5 MG tablet Take 0.5 tablets (2.5 mg total) by mouth daily as needed for anxiety. 07/02/21   Eulogio Bear, NP  Dulaglutide (TRULICITY) 1.5 HA/1.9FX SOPN Inject 1.5 mg into the skin once a week. 07/02/21   Eulogio Bear, NP  DULoxetine (CYMBALTA) 20 MG capsule Take 3 capsules (60 mg total) by mouth daily. 09/19/21   Eulogio Bear, NP  ferrous sulfate 325 (65 FE) MG tablet Take 325 mg by mouth daily with breakfast.    [provider]  glucose blood test strip Please dispense based on patient and insurance preference.  Use as directed to monitor FSBS 1-2 times daily.  Dx E11.9 05/01/21   Eulogio Bear, NP  HYDROcodone-acetaminophen (NORCO) 5-325 MG tablet Take 1 tablet by mouth every 6 (six) hours as needed for moderate pain. 05/03/20   Alycia Rossetti, MD  Lancets MISC Please dispense based on patient and insurance preference.  Use as directed to monitor FSBS 1-2 times daily.  Dx E11.9 05/01/21   Eulogio Bear, NP  Multiple Vitamin (MULTIVITAMIN) tablet Take 1 tablet by mouth daily.     [provider]  pregabalin (LYRICA) 300 MG capsule Take 1 capsule (300 mg total) by mouth 2 (two) times daily. 05/01/21   Eulogio Bear, NP  tizanidine (ZANAFLEX) 2 MG capsule Take 1 capsule (2 mg total) by mouth 3 (three) times daily as needed for muscle spasms. Take first dose at night  time and monitor for drowsiness.  Do not take while driving or operating heavy machinery if this medication makes you drowsy. 08/13/21   Eulogio Bear, NP  zolpidem (AMBIEN) 10 MG tablet Take 1 tablet (10 mg total) by mouth at bedtime as needed. 07/09/21   Eulogio Bear, NP    Allergies    Actos [pioglitazone hydrochloride] and Latex  Review of Systems   Review of Systems  Constitutional:  Negative for chills and fever.  Eyes:  Positive for blurred vision, photophobia and redness. Negative for double vision, discharge and itching.  Gastrointestinal:  Negative for nausea  and vomiting.  Neurological:  Positive for headaches. Negative for tingling, weakness and numbness.   Physical Exam Updated Vital Signs BP (!) 155/84 (BP Location: Left Arm)   Pulse 76   Temp 98.3 F (36.8 C) (Oral)   Resp 17   LMP  (LMP Unknown)   SpO2 100%   Physical Exam Vitals and nursing note reviewed.  Constitutional:      General: She is not in acute distress.    Appearance: She is well-developed. She is not diaphoretic.  HENT:     Head: Normocephalic and atraumatic.     Right Ear: External ear normal.     Left Ear: External ear normal.     Nose: Nose normal.     Mouth/Throat:     Mouth: Mucous membranes are moist.  Eyes:     General: No scleral icterus.       Right eye: No foreign body.        Left eye: Foreign body (contact lens stuck to eye) present.    Intraocular pressure: Right eye pressure is 20 mmHg. Left eye pressure is 21 mmHg. Measurements were taken using a handheld tonometer.    Extraocular Movements: Extraocular movements intact.     Conjunctiva/sclera:     Left eye: Left conjunctiva is injected.     Pupils: Pupils are equal, round, and reactive to light.     Left eye: No corneal abrasion or fluorescein uptake.     Slit lamp exam:    Left eye: Photophobia present. No corneal ulcer.  Cardiovascular:     Rate and Rhythm: Normal rate and regular rhythm.     Heart sounds:  Normal heart sounds. No murmur heard.   No friction rub. No gallop.  Pulmonary:     Effort: Pulmonary effort is normal. No respiratory distress.     Breath sounds: Normal breath sounds.  Abdominal:     General: Bowel sounds are normal. There is no distension.     Palpations: Abdomen is soft. There is no mass.     Tenderness: There is no abdominal tenderness. There is no guarding.  Musculoskeletal:     Cervical back: Normal range of motion.  Skin:    General: Skin is warm and dry.  Neurological:     Mental Status: She is alert and oriented to person, place, and time.  Psychiatric:        Behavior: Behavior normal.    ED Results / Procedures / Treatments   Labs (all labs ordered are listed, but only abnormal results are displayed) Labs Reviewed - No data to display  EKG None  Radiology No results found.  Procedures .Foreign Body Removal  Date/Time: 10/12/2021 4:47 PM Performed by: Margarita Mail, PA-C Authorized by: Margarita Mail, PA-C  Consent: Verbal consent obtained. Consent given by: patient Patient understanding: patient does not state understanding of the procedure being performed Patient consent: the patient's understanding of the procedure matches consent given Patient identity confirmed: hospital-assigned identification number Time out: Immediately prior to procedure a "time out" was called to verify the correct patient, procedure, equipment, support staff and site/side marked as required. Body area: eye Location details: left cornea  Anesthesia: Local Anesthetic: tetracaine drops Localization method: visualized Removal mechanism: gloved fingers. Eye examined with fluorescein. No fluorescein uptake. No residual rust ring present. Depth: superficial Complexity: simple 1 objects recovered. Post-procedure assessment: foreign body removed    Medications Ordered in ED Medications  fluorescein ophthalmic strip 1 strip (1 strip Left Eye Given by  Other  10/12/21 1508)  tetracaine (PONTOCAINE) 0.5 % ophthalmic solution 2 drop (2 drops Both Eyes Given by Other 10/12/21 1515)  phenylephrine (MYDFRIN) 2.5 % ophthalmic solution 1 drop (1 drop Left Eye Given by Other 10/12/21 1512)  oxyCODONE-acetaminophen (PERCOCET/ROXICET) 5-325 MG per tablet 1 tablet (1 tablet Oral Given 10/12/21 1510)    ED Course  I have reviewed the triage vital signs and the nursing notes.  Pertinent labs & imaging results that were available during my care of the patient were reviewed by me and considered in my medical decision making (see chart for details).    MDM Rules/Calculators/A&P                           Patient here with left eye pain and photophobia.  Given tetracaine without relief of her symptoms.  No obvious corneal abrasions.  Attached contact lens removed from the left eye.  Pressures are normal, no evidence of acute angle-closure glaucoma.  Case discussed with Dr. Dennison Mascot of ophthalmology who recommends moxifloxacin eyedrops 4 times daily, pain control and close follow-up with him in his office tomorrow at 10:30 AM.  Patient is comfortable with this.  I suspect the patient may have a component of traumatic iritis as she had both direct and indirect pain in the eye.  I dilated with phenylephrine drops which improved patient's pain.  Patient will be discharged with specific instructions including no contact lens wearing, patient appears otherwise appropriate for discharge PDMP reviewed during this encounter.  Final Clinical Impression(s) / ED Diagnoses Final diagnoses:  None    Rx / DC Orders ED Discharge Orders     None        Margarita Mail, PA-C 10/12/21 1650    Sherwood Gambler, MD 10/13/21 1552

## 2021-10-31 ENCOUNTER — Ambulatory Visit (INDEPENDENT_AMBULATORY_CARE_PROVIDER_SITE_OTHER): Payer: 59 | Admitting: Nurse Practitioner

## 2021-10-31 ENCOUNTER — Other Ambulatory Visit: Payer: Self-pay | Admitting: *Deleted

## 2021-10-31 ENCOUNTER — Other Ambulatory Visit: Payer: Self-pay

## 2021-10-31 ENCOUNTER — Encounter: Payer: Self-pay | Admitting: Nurse Practitioner

## 2021-10-31 VITALS — BP 122/88 | HR 107 | Temp 98.9°F | Resp 18 | Ht 63.0 in | Wt 209.0 lb

## 2021-10-31 DIAGNOSIS — E785 Hyperlipidemia, unspecified: Secondary | ICD-10-CM

## 2021-10-31 DIAGNOSIS — G8929 Other chronic pain: Secondary | ICD-10-CM

## 2021-10-31 DIAGNOSIS — Z0001 Encounter for general adult medical examination with abnormal findings: Secondary | ICD-10-CM

## 2021-10-31 DIAGNOSIS — Z23 Encounter for immunization: Secondary | ICD-10-CM | POA: Diagnosis not present

## 2021-10-31 DIAGNOSIS — Z0000 Encounter for general adult medical examination without abnormal findings: Secondary | ICD-10-CM

## 2021-10-31 DIAGNOSIS — E119 Type 2 diabetes mellitus without complications: Secondary | ICD-10-CM

## 2021-10-31 DIAGNOSIS — F5101 Primary insomnia: Secondary | ICD-10-CM

## 2021-10-31 DIAGNOSIS — F331 Major depressive disorder, recurrent, moderate: Secondary | ICD-10-CM

## 2021-10-31 DIAGNOSIS — G4733 Obstructive sleep apnea (adult) (pediatric): Secondary | ICD-10-CM | POA: Diagnosis not present

## 2021-10-31 DIAGNOSIS — F411 Generalized anxiety disorder: Secondary | ICD-10-CM

## 2021-10-31 DIAGNOSIS — N179 Acute kidney failure, unspecified: Secondary | ICD-10-CM

## 2021-10-31 DIAGNOSIS — Z124 Encounter for screening for malignant neoplasm of cervix: Secondary | ICD-10-CM

## 2021-10-31 DIAGNOSIS — E559 Vitamin D deficiency, unspecified: Secondary | ICD-10-CM

## 2021-10-31 DIAGNOSIS — D519 Vitamin B12 deficiency anemia, unspecified: Secondary | ICD-10-CM

## 2021-10-31 MED ORDER — DULOXETINE HCL 60 MG PO CPEP: 60.0000 mg | ORAL_CAPSULE | Freq: Every day | ORAL | 1 refills | Status: DC

## 2021-10-31 MED ORDER — PREGABALIN 300 MG PO CAPS: 300.0000 mg | ORAL_CAPSULE | Freq: Every day | ORAL | 1 refills | Status: DC

## 2021-10-31 MED ORDER — TRULICITY 3 MG/0.5ML ~~LOC~~ SOAJ: 3.0000 mg | SUBCUTANEOUS | 1 refills | Status: DC

## 2021-10-31 MED ORDER — QUETIAPINE FUMARATE 25 MG PO TABS: 25.0000 mg | ORAL_TABLET | Freq: Every day | ORAL | 1 refills | Status: DC

## 2021-10-31 NOTE — Progress Notes (Signed)
BP 122/88 (BP Location: Left Arm, Patient Position: Sitting)   Pulse (!) 107   Temp 98.9 F (37.2 C) (Oral)   Resp 18   Ht _0  (1.6 m)   Wt 209 lb (94.8 kg)   LMP  (LMP Unknown)   SpO2 98%   BMI 37.02 kg/m    Subjective:    Patient ID: Sabrina Roberts, female    DOB: 1971-09-18, 50 y.o.   MRN: [id number]  HPI: Sabrina Roberts is a 50 y.o. female presenting on 10/31/2021 for comprehensive medical examination. Current medical complaints include: none  Patient reports she has had a lot of recent life changes.  She has asked her husband for a divorce.  She has also reached out to a Counselor and has an appointment next week.  She is working on herself and caring for herself more.   She is still having issues with her back, sees Neurosurgeon for injections and pain medication.  She takes pregabalin 300 mg for chronic pain and is requesting refill of this today.  Still having trouble sleeping, understands that we do not want to give too much sedating medication.  Willing to try Seroquel today.   She ran out of Trulicity earlier this week, requesting refill today.  She currently lives with: husband, was living with dad LMP: postmenopausal; has been 5 years.   Depression Screen done today and results listed below:  Depression screen [facility] 2/9 10/31/2021 07/02/2021 05/03/2020 03/21/2019 03/25/2018  Decreased Interest 2 1 0 2 2  Down, Depressed, Hopeless 2 2 0 2 2  PHQ - 2 Score 4 3 0 4 4  Altered sleeping _1 Tired, decreased energy _2 Change in appetite _3 Feeling bad or failure about yourself  1 1 0 2 2  Trouble concentrating _4 Moving slowly or fidgety/restless 1 0 2 0 3  Suicidal thoughts 0 0 0 0 1  PHQ-9 Score _5 Difficult doing work/chores Somewhat difficult Not difficult at all Somewhat difficult Not difficult at all Extremely dIfficult  Some recent data might be hidden    The patient does not have a history of falls. I did not  complete a risk assessment for falls. A plan of care for falls was not documented.   Past Medical History:  Past Medical History:  Diagnosis Date   Anemia    Anxiety    Arthritis    Depression    Diabetes mellitus    Hyperlipidemia    Hypertension    Obesity    Sleep apnea    Has CPAP but has lost weight and hasnt used    Surgical History:  Past Surgical History:  Procedure Laterality Date   CARPAL TUNNEL RELEASE Right 10/01/2016   Procedure: RIGHT CARPAL TUNNEL RELEASE;  Surgeon: Carole Civil, MD;  Location: [facility];  Service: Orthopedics;  Laterality: Right;   CARPAL TUNNEL RELEASE Left 10/22/2016   Procedure: CARPAL TUNNEL RELEASE;  Surgeon: Carole Civil, MD;  Location: [facility];  Service: Orthopedics;  Laterality: Left;   CESAREAN SECTION  2002   CHOLECYSTECTOMY  dec 2012   GASTRIC BYPASS  dec 2012   KNEE ARTHROSCOPY Right     Medications:  Current Outpatient Medications on File Prior to Visit  Medication Sig   atorvastatin (LIPITOR) 10 MG tablet Take 1 tablet (10 mg total) by mouth daily.  Blood Glucose Monitoring Suppl (BLOOD GLUCOSE SYSTEM PAK) KIT Please dispense based on patient and insurance preference.  Use as directed to monitor FSBS 1-2 times daily.  Dx E11.9   Cholecalciferol (VITAMIN D3) 250 MCG (10000 UT) capsule Take 1 capsule (10,000 Units total) by mouth daily.   DULoxetine (CYMBALTA) 20 MG capsule Take 3 capsules (60 mg total) by mouth daily.   ferrous sulfate 325 (65 FE) MG tablet Take 325 mg by mouth daily with breakfast.   glucose blood test strip Please dispense based on patient and insurance preference.  Use as directed to monitor FSBS 1-2 times daily.  Dx E11.9   HYDROcodone-acetaminophen (NORCO) 5-325 MG tablet Take 1 tablet by mouth every 6 (six) hours as needed for severe pain.   Lancets MISC Please dispense based on patient and insurance preference.  Use as directed to monitor FSBS 1-2 times daily.  Dx E11.9   moxifloxacin (VIGAMOX) 0.5  % ophthalmic solution Place 1 drop into the left eye 4 (four) times daily.   Multiple Vitamin (MULTIVITAMIN) tablet Take 1 tablet by mouth daily.    rOPINIRole (REQUIP) 0.5 MG tablet Take 0.5-1.5 mg by mouth at bedtime.   tizanidine (ZANAFLEX) 2 MG capsule Take 1 capsule (2 mg total) by mouth 3 (three) times daily as needed for muscle spasms. Take first dose at night time and monitor for drowsiness.  Do not take while driving or operating heavy machinery if this medication makes you drowsy.   Current Facility-Administered Medications on File Prior to Visit  Medication   cyanocobalamin ((VITAMIN B-12)) injection 1,000 mcg    Allergies:  Allergies  Allergen Reactions   Actos [Pioglitazone Hydrochloride] Anaphylaxis, Hives and Other (See Comments)    Chest pain    Latex Rash    Social History:  Social History   Socioeconomic History   Marital status: Married    Spouse name: Not on file   Number of children: Not on file   Years of education: Not on file   Highest education level: Not on file  Occupational History   Not on file  Tobacco Use   Smoking status: Never    Passive exposure: Current   Smokeless tobacco: Never  Vaping Use   Vaping Use: Never used  Substance and Sexual Activity   Alcohol use: Yes    Alcohol/week: 0.0 standard drinks    Comment: occasionally   Drug use: No   Sexual activity: Yes    Partners: Male    Birth control/protection: None  Other Topics Concern   Not on file  Social History Narrative   Not on file   Social Determinants of Health   Financial Resource Strain: Not on file  Food Insecurity: Not on file  Transportation Needs: Not on file  Physical Activity: Not on file  Stress: Not on file  Social Connections: Not on file  Intimate Partner Violence: Not on file   Social History   Tobacco Use  Smoking Status Never   Passive exposure: Current  Smokeless Tobacco Never   Social History   Substance and Sexual Activity  Alcohol Use  Yes   Alcohol/week: 0.0 standard drinks   Comment: occasionally    Family History:  Family History  Problem Relation Age of Onset   Hypertension Mother    Depression Mother    Diabetes Mother    Irritable bowel syndrome Mother    Heart disease Mother    Stroke Mother    Hypertension Father    Hyperlipidemia Father  Prostate cancer Father    Colon polyps Father    Heart disease Father    Kidney Stones Father    Diverticulitis Father    Colon cancer Father    Breast cancer Paternal Grandmother    Colon cancer Paternal Grandfather    Diabetes Maternal Grandmother    Breast cancer Maternal Aunt    Stomach cancer Paternal Uncle    Colon cancer Other        Paternal Great Uncles x 4   Colon polyps Paternal Aunt        x 3    Past medical history, surgical history, medications, allergies, family history and social history reviewed with patient today and changes made to appropriate areas of the chart.   Review of Systems  Constitutional: Negative.   HENT: Negative.    Eyes: Negative.   Respiratory:  Positive for shortness of breath (chronic). Negative for cough and wheezing.   Cardiovascular: Negative.   Gastrointestinal: Negative.   Genitourinary: Negative.   Musculoskeletal:  Positive for back pain (chronic).  Skin: Negative.   Neurological: Negative.   Psychiatric/Behavioral:  Positive for depression. The patient is nervous/anxious and has insomnia.       Objective:    BP 122/88 (BP Location: Left Arm, Patient Position: Sitting)   Pulse (!) 107   Temp 98.9 F (37.2 C) (Oral)   Resp 18   Ht _0  (1.6 m)   Wt 209 lb (94.8 kg)   LMP  (LMP Unknown)   SpO2 98%   BMI 37.02 kg/m   Wt Readings from Last 3 Encounters:  10/31/21 209 lb (94.8 kg)  09/22/21 219 lb (99.3 kg)  09/19/21 218 lb (98.9 kg)    Physical Exam Vitals and nursing note reviewed. Exam conducted with a chaperone present [facility]).  Constitutional:      General: She is not in acute distress.     Appearance: Normal appearance. She is obese. She is not ill-appearing or toxic-appearing.  HENT:     Head: Normocephalic and atraumatic.     Nose: No rhinorrhea.  Eyes:     General: No scleral icterus.    Extraocular Movements: Extraocular movements intact.  Neck:     Vascular: No carotid bruit.  Cardiovascular:     Rate and Rhythm: Normal rate and regular rhythm.     Pulses: Normal pulses.     Heart sounds: Normal heart sounds. No murmur heard. Pulmonary:     Effort: Pulmonary effort is normal. No respiratory distress.     Breath sounds: No wheezing or rhonchi.  Chest:  Breasts:    Right: Normal. No inverted nipple, mass, nipple discharge or skin change.     Left: Normal. No inverted nipple, mass, nipple discharge or skin change.  Abdominal:     General: Abdomen is flat. Bowel sounds are normal. There is no distension.     Palpations: Abdomen is soft.     Tenderness: There is no abdominal tenderness.  Genitourinary:    Exam position: Lithotomy position.     Pubic Area: No rash.      Labia:        Right: No rash or tenderness.        Left: No rash or tenderness.      Vagina: Normal. No signs of injury. No vaginal discharge or bleeding.     Cervix: No friability, erythema or cervical bleeding.     Uterus: Normal.      Adnexa: Right adnexa normal and left  adnexa normal.  Musculoskeletal:        General: No swelling or tenderness. Normal range of motion.     Cervical back: Normal range of motion and neck supple. No rigidity or tenderness.     Right lower leg: No edema.     Left lower leg: No edema.  Lymphadenopathy:     Lower Body: No right inguinal adenopathy. No left inguinal adenopathy.  Skin:    General: Skin is warm and dry.     Capillary Refill: Capillary refill takes less than 2 seconds.     Coloration: Skin is not jaundiced or pale.  Neurological:     General: No focal deficit present.     Mental Status: She is alert and oriented to person, place, and time.      Motor: No weakness.     Gait: Gait normal.  Psychiatric:        Mood and Affect: Mood normal.        Behavior: Behavior normal.        Thought Content: Thought content normal.        Judgment: Judgment normal.       Assessment & Plan:   Problem List Items Addressed This Visit       Respiratory   OSA (obstructive sleep apnea)   Relevant Orders   CBC with Differential/Platelet     Endocrine   DM (diabetes mellitus) ([place])   Relevant Medications   Dulaglutide (TRULICITY) 3 ZO/1.0RU SOPN   Other Relevant Orders   Hemoglobin A1c     Other   Vitamin D deficiency   Relevant Orders   VITAMIN D 25 Hydroxy (Vit-D Deficiency, Fractures)   Insomnia    Start Seroquel.  Do not want to use sedative as she is also on opioid medication and pregabalin for pain.  Follow up in 1 month to see how this is going.       Relevant Medications   QUEtiapine (SEROQUEL) 25 MG tablet   Hyperlipidemia   Relevant Orders   Lipid panel   COMPLETE METABOLIC PANEL WITH GFR   Chronic back pain   Relevant Medications   pregabalin (LYRICA) 300 MG capsule   Anemia   Relevant Orders   CBC with Differential/Platelet   Vitamin B12   Other Visit Diagnoses     Annual physical exam    -  Primary   We checked labs today.  I've asked the patient to schedule a follow up visit to discuss her chronic conditions.   Relevant Orders   CBC with Differential/Platelet   Hemoglobin A1c   Lipid panel   COMPLETE METABOLIC PANEL WITH GFR   VITAMIN D 25 Hydroxy (Vit-D Deficiency, Fractures)   Vitamin B12   PAP,TP IMGw/HPV RNA,rflx [id number],91/47   Screening for cervical cancer       Relevant Orders   PAP,TP IMGw/HPV RNA,rflx [id number],86/57        Follow up plan: Return for 1-2 weeks address chronic concerns.   LABORATORY TESTING:  - Pap smear: pap done  IMMUNIZATIONS:   - Tdap: Tetanus vaccination status reviewed: last tetanus booster within 10 years. - Influenza: Up to date - Pneumovax: Up to  date - Prevnar: Up to date - HPV: Not applicable - Zostavax vaccine: I recommended, patient is going to think about - COVID-19 vaccine: has had 2 doses with 1 booster, she is thinking about 4th dose and I recommended most recent booster.  SCREENING: -Mammogram:  has been ordered, patient needs to schedule   -  Colonoscopy: Up to date  - Bone Density: Not applicable  -Hearing Test: Not applicable  -Spirometry: Not applicable   PATIENT COUNSELING:   Advised to take 1 mg of folate supplement per day if capable of pregnancy.   Sexuality: Discussed sexually transmitted diseases, partner selection, use of condoms, avoidance of unintended pregnancy  and contraceptive alternatives.   Advised to avoid cigarette smoking.  I discussed with the patient that most people either abstain from alcohol or drink within safe limits (<=14/week and <=4 drinks/occasion for males, <=7/weeks and <= 3 drinks/occasion for females) and that the risk for alcohol disorders and other health effects rises proportionally with the number of drinks per week and how often a drinker exceeds daily limits.  Discussed cessation/primary prevention of drug use and availability of treatment for abuse.   Diet: Encouraged to adjust caloric intake to maintain  or achieve ideal body weight, to reduce intake of dietary saturated fat and total fat, to limit sodium intake by avoiding high sodium foods and not adding table salt, and to maintain adequate dietary potassium and calcium preferably from fresh fruits, vegetables, and low-fat dairy products.    stressed the importance of regular exercise  Injury prevention: Discussed safety belts, safety helmets, smoke detector, smoking near bedding or upholstery.   Dental health: Discussed importance of regular tooth brushing, flossing, and dental visits.    NEXT PREVENTATIVE PHYSICAL DUE IN 1 YEAR. Return for 1-2 weeks address chronic concerns.

## 2021-10-31 NOTE — Assessment & Plan Note (Signed)
Start Seroquel.  Do not want to use sedative as she is also on opioid medication and pregabalin for pain.  Follow up in 1 month to see how this is going.

## 2021-11-01 LAB — COMPLETE METABOLIC PANEL WITH GFR
AG Ratio: 1.8 (calc) (ref 1.0–2.5)
ALT: 32 U/L — ABNORMAL HIGH (ref 6–29)
AST: 18 U/L (ref 10–35)
Albumin: 4.3 g/dL (ref 3.6–5.1)
Alkaline phosphatase (APISO): 80 U/L (ref 37–153)
BUN/Creatinine Ratio: 16 (calc) (ref 6–22)
BUN: 29 mg/dL — ABNORMAL HIGH (ref 7–25)
CO2: 26 mmol/L (ref 20–32)
Calcium: 9.4 mg/dL (ref 8.6–10.4)
Chloride: 101 mmol/L (ref 98–110)
Creat: 1.79 mg/dL — ABNORMAL HIGH (ref 0.50–1.03)
Globulin: 2.4 g/dL (calc) (ref 1.9–3.7)
Glucose, Bld: 180 mg/dL — ABNORMAL HIGH (ref 65–99)
Potassium: 3.3 mmol/L — ABNORMAL LOW (ref 3.5–5.3)
Sodium: 138 mmol/L (ref 135–146)
Total Bilirubin: 0.8 mg/dL (ref 0.2–1.2)
Total Protein: 6.7 g/dL (ref 6.1–8.1)
eGFR: 34 mL/min/{1.73_m2} — ABNORMAL LOW (ref >=60)

## 2021-11-01 LAB — CBC WITH DIFFERENTIAL/PLATELET
Absolute Monocytes: 621 cells/uL (ref 200–950)
Basophils Absolute: 19 cells/uL (ref 0–200)
Basophils Relative: 0.2 %
Eosinophils Absolute: 29 cells/uL (ref 15–500)
Eosinophils Relative: 0.3 %
HCT: 39.9 % (ref 35.0–45.0)
Hemoglobin: 13.4 g/dL (ref 11.7–15.5)
Lymphs Abs: 2008 cells/uL (ref 850–3900)
MCH: 32.3 pg (ref 27.0–33.0)
MCHC: 33.6 g/dL (ref 32.0–36.0)
MCV: 96.1 fL (ref 80.0–100.0)
MPV: 11.6 fL (ref 7.5–12.5)
Monocytes Relative: 6.4 %
Neutro Abs: 7023 cells/uL (ref 1500–7800)
Neutrophils Relative %: 72.4 %
Platelets: 270 10*3/uL (ref 140–400)
RBC: 4.15 10*6/uL (ref 3.80–5.10)
RDW: 12.8 % (ref 11.0–15.0)
Total Lymphocyte: 20.7 %
WBC: 9.7 10*3/uL (ref 3.8–10.8)

## 2021-11-01 LAB — LIPID PANEL
Cholesterol: 200 mg/dL — ABNORMAL HIGH (ref <200)
HDL: 76 mg/dL (ref >=50)
LDL Cholesterol (Calc): 103 mg/dL (calc) — ABNORMAL HIGH
Non-HDL Cholesterol (Calc): 124 mg/dL (calc) (ref <130)
Total CHOL/HDL Ratio: 2.6 (calc) (ref <5.0)
Triglycerides: 112 mg/dL (ref <150)

## 2021-11-01 LAB — VITAMIN B12: Vitamin B-12: 283 pg/mL (ref 200–1100)

## 2021-11-01 LAB — HEMOGLOBIN A1C
Hgb A1c MFr Bld: 7.6 % of total Hgb — ABNORMAL HIGH (ref <5.7)
Mean Plasma Glucose: 171 mg/dL
eAG (mmol/L): 9.5 mmol/L

## 2021-11-01 LAB — VITAMIN D 25 HYDROXY (VIT D DEFICIENCY, FRACTURES): Vit D, 25-Hydroxy: 23 ng/mL — ABNORMAL LOW (ref 30–100)

## 2021-11-04 LAB — PAP, TP IMAGING W/ HPV RNA, RFLX HPV TYPE 16,18/45: HPV DNA High Risk: NOT DETECTED

## 2021-11-05 ENCOUNTER — Telehealth: Payer: Self-pay | Admitting: Nurse Practitioner

## 2021-11-05 NOTE — Telephone Encounter (Signed)
Received fax from [facility] requesting more information on a claim for long term disability. Requested the following to be completed: [place] in regards to period of 05/08/2021- 11/04/2021 Office notes in regards to period of 05/08/2021- 11/04/2021 Recommendations for return to work with restrictions if any.  Discussed form with provider. Reports that patient has been out of work for back issues and the neurology has completed the leave forms.   States that she has not advised that patient should remain out of work for behavioral health or mental issues.   Advised if patient is requesting these forms for leave, she will need a dedicated appointment for forms with NP.   Call placed to patient. States that she was removed from work in May 2022- indefinitely from [place].  Reports that short term disability has completed at 12 weeks. States that she is filing for long term disability. Reports that during initial interview a review of systems questionnaire was completed. Patient disclosed difficulty with anxiety and depression.   Provider made aware and agreeable to letter to [facility] stating that patient's disability is not based on anxiety/ depression. Therefore, behavioral health form is not relevant. Since she is out of work due to her back, and Dr. Trula Ore is handling her leave, we cannot speak to any restrictions or return to work.   Letter transcribed and chart notes printed to be faxed.

## 2021-11-05 NOTE — Telephone Encounter (Signed)
Forms have been received and given to [facility] for review. We will contact the pt if we need any additional information or once we are finished with the forms.   Attempted to contact pt to advise. No answer. LVM to return call.

## 2021-11-05 NOTE — Addendum Note (Signed)
Addended by: Noemi Chapel A on: 11/05/2021 08:33 AM   Modules accepted: Orders

## 2021-11-05 NOTE — Telephone Encounter (Signed)
Patient called to see if disability forms faxed yesterday from [facility] were received; patient hasn't been paid in two weeks.   Unable to locate forms. Gave patient fax number and advised patient to confirm correct number used with [employer]. Also advised patient to have forms refaxed and to call immediately to confirm receipt.  Please advise at [phone].

## 2021-11-06 NOTE — Telephone Encounter (Signed)
Letter reviewed and signed.  Agree - she has not been out of work due to mental health.

## 2021-11-07 ENCOUNTER — Encounter: Payer: Self-pay | Admitting: Nurse Practitioner

## 2021-11-10 NOTE — Telephone Encounter (Signed)
Provider has not advised that patient should remain out of work for behavioral health or mental issues.   We will not complete forms.   Patient was made aware that forms will not be completed as they are not relevant to her disability claim for her back.

## 2021-11-10 NOTE — Telephone Encounter (Signed)
Pt called in today stating that the forms that were faxed, were received blank. Pt just asks if the forms could be faxed over again. Please advise.  Cb#: [phone]

## 2021-11-28 ENCOUNTER — Ambulatory Visit (HOSPITAL_COMMUNITY): Payer: 59

## 2021-12-10 ENCOUNTER — Ambulatory Visit (HOSPITAL_COMMUNITY)
Admission: RE | Admit: 2021-12-10 | Discharge: 2021-12-10 | Disposition: A | Discharge status: Home / Self Care | Payer: 59 | Source: Ambulatory Visit | Attending: Nurse Practitioner | Admitting: Nurse Practitioner

## 2021-12-10 ENCOUNTER — Other Ambulatory Visit: Payer: Self-pay

## 2021-12-10 DIAGNOSIS — N179 Acute kidney failure, unspecified: Secondary | ICD-10-CM | POA: Insufficient documentation

## 2021-12-31 ENCOUNTER — Other Ambulatory Visit: Payer: Self-pay | Admitting: Nurse Practitioner

## 2021-12-31 DIAGNOSIS — F5101 Primary insomnia: Secondary | ICD-10-CM

## 2022-02-11 ENCOUNTER — Telehealth: Payer: Self-pay | Admitting: Nurse Practitioner

## 2022-02-11 NOTE — Telephone Encounter (Signed)
Patient's daughter called to request refill of   QUEtiapine (SEROQUEL) 25 MG tablet [[id number]]   Pharmacy confirmed as   [place], [place] - [place] [address]  [address] [place], [place] [address]  Phone:  [phone]  Fax:  [phone]   Please advise at [phone]

## 2022-02-12 NOTE — Telephone Encounter (Signed)
Rx sent in 01/02/22, #90, 3 refills.  Pt should not need refills at this time.

## 2022-02-14 ENCOUNTER — Emergency Department (HOSPITAL_COMMUNITY): Payer: Self-pay

## 2022-02-14 ENCOUNTER — Encounter (HOSPITAL_COMMUNITY): Payer: Self-pay | Admitting: Emergency Medicine

## 2022-02-14 ENCOUNTER — Emergency Department (HOSPITAL_COMMUNITY)
Admission: EM | Admit: 2022-02-14 | Discharge: 2022-02-14 | Disposition: A | Discharge status: Home / Self Care | Payer: Self-pay | Attending: Emergency Medicine | Admitting: Emergency Medicine

## 2022-02-14 ENCOUNTER — Other Ambulatory Visit: Payer: Self-pay

## 2022-02-14 DIAGNOSIS — E872 Acidosis, unspecified: Secondary | ICD-10-CM

## 2022-02-14 DIAGNOSIS — N390 Urinary tract infection, site not specified: Secondary | ICD-10-CM | POA: Insufficient documentation

## 2022-02-14 DIAGNOSIS — Z91040 Latex allergy status: Secondary | ICD-10-CM | POA: Insufficient documentation

## 2022-02-14 DIAGNOSIS — F419 Anxiety disorder, unspecified: Secondary | ICD-10-CM | POA: Insufficient documentation

## 2022-02-14 DIAGNOSIS — E119 Type 2 diabetes mellitus without complications: Secondary | ICD-10-CM | POA: Insufficient documentation

## 2022-02-14 DIAGNOSIS — R61 Generalized hyperhidrosis: Secondary | ICD-10-CM | POA: Insufficient documentation

## 2022-02-14 DIAGNOSIS — I10 Essential (primary) hypertension: Secondary | ICD-10-CM | POA: Insufficient documentation

## 2022-02-14 DIAGNOSIS — R0789 Other chest pain: Secondary | ICD-10-CM | POA: Insufficient documentation

## 2022-02-14 DIAGNOSIS — E8721 Acute metabolic acidosis: Secondary | ICD-10-CM | POA: Insufficient documentation

## 2022-02-14 DIAGNOSIS — R110 Nausea: Secondary | ICD-10-CM | POA: Insufficient documentation

## 2022-02-14 LAB — URINALYSIS, ROUTINE W REFLEX MICROSCOPIC
Bilirubin Urine: NEGATIVE
Glucose, UA: NEGATIVE mg/dL
Hgb urine dipstick: NEGATIVE
Ketones, ur: NEGATIVE mg/dL
Leukocytes,Ua: NEGATIVE
Nitrite: POSITIVE — AB
Protein, ur: NEGATIVE mg/dL
Specific Gravity, Urine: 1.013 (ref 1.005–1.030)
pH: 5 (ref 5.0–8.0)

## 2022-02-14 LAB — BASIC METABOLIC PANEL
Anion gap: 13 (ref 5–15)
Anion gap: 8 (ref 5–15)
BUN: 11 mg/dL (ref 6–20)
BUN: 11 mg/dL (ref 6–20)
CO2: 15 mmol/L — ABNORMAL LOW (ref 22–32)
CO2: 20 mmol/L — ABNORMAL LOW (ref 22–32)
Calcium: 9 mg/dL (ref 8.9–10.3)
Calcium: 8.6 mg/dL — ABNORMAL LOW (ref 8.9–10.3)
Chloride: 107 mmol/L (ref 98–111)
Chloride: 108 mmol/L (ref 98–111)
Creatinine, Ser: 1.04 mg/dL — ABNORMAL HIGH (ref 0.44–1.00)
Creatinine, Ser: 0.79 mg/dL (ref 0.44–1.00)
GFR, Estimated: >60 mL/min (ref >60)
GFR, Estimated: >60 mL/min (ref >60)
Glucose, Bld: 95 mg/dL (ref 70–99)
Glucose, Bld: 94 mg/dL (ref 70–99)
Potassium: 3.5 mmol/L (ref 3.5–5.1)
Potassium: 3.3 mmol/L — ABNORMAL LOW (ref 3.5–5.1)
Sodium: 135 mmol/L (ref 135–145)
Sodium: 136 mmol/L (ref 135–145)

## 2022-02-14 LAB — BLOOD GAS, VENOUS
Acid-base deficit: 0.7 mmol/L (ref 0.0–2.0)
Bicarbonate: 24.2 mmol/L (ref 20.0–28.0)
Drawn by: 6051
FIO2: 21 %
O2 Saturation: 40.8 %
Patient temperature: 36.5
pCO2, Ven: 39 mmHg — ABNORMAL LOW (ref 44–60)
pH, Ven: 7.4 (ref 7.25–7.43)
pO2, Ven: <31 mmHg — CL (ref 32–45)

## 2022-02-14 LAB — TROPONIN I (HIGH SENSITIVITY)
Troponin I (High Sensitivity): 4 ng/L (ref <18)
Troponin I (High Sensitivity): 4 ng/L (ref <18)

## 2022-02-14 LAB — LACTIC ACID, PLASMA: Lactic Acid, Venous: 1.1 mmol/L (ref 0.5–1.9)

## 2022-02-14 LAB — BETA-HYDROXYBUTYRIC ACID: Beta-Hydroxybutyric Acid: 0.18 mmol/L (ref 0.05–0.27)

## 2022-02-14 LAB — POC URINE PREG, ED: Preg Test, Ur: NEGATIVE

## 2022-02-14 MED ORDER — HYDROCODONE-ACETAMINOPHEN 5-325 MG PO TABS
1.0000 | ORAL_TABLET | Freq: Once | ORAL | Status: AC
  Administered 2022-02-14: 1 via ORAL
  Filled 2022-02-14: qty 1

## 2022-02-14 MED ORDER — NITROGLYCERIN 0.4 MG SL SUBL
0.4000 mg | SUBLINGUAL_TABLET | SUBLINGUAL | Status: AC | PRN
  Administered 2022-02-14 (×3): 0.4 mg via SUBLINGUAL
  Filled 2022-02-14: qty 1

## 2022-02-14 MED ORDER — SUCRALFATE 1 G PO TABS: 1.0000 g | ORAL_TABLET | Freq: Three times a day (TID) | ORAL | 0 refills | Status: DC

## 2022-02-14 MED ORDER — ASPIRIN 81 MG PO CHEW
324.0000 mg | CHEWABLE_TABLET | Freq: Once | ORAL | Status: AC
  Administered 2022-02-14: 324 mg via ORAL
  Filled 2022-02-14: qty 4

## 2022-02-14 MED ORDER — LIDOCAINE VISCOUS HCL 2 % MT SOLN
15.0000 mL | Freq: Once | OROMUCOSAL | Status: AC
Start: 2022-02-14 — End: 2022-02-14
  Administered 2022-02-14: 15 mL via ORAL
  Filled 2022-02-14: qty 15

## 2022-02-14 MED ORDER — ALUM & MAG HYDROXIDE-SIMETH 200-200-20 MG/5ML PO SUSP
30.0000 mL | Freq: Once | ORAL | Status: AC
Start: 2022-02-14 — End: 2022-02-14
  Administered 2022-02-14: 30 mL via ORAL
  Filled 2022-02-14: qty 30

## 2022-02-14 MED ORDER — DIAZEPAM 5 MG PO TABS: 5.0000 mg | ORAL_TABLET | Freq: Two times a day (BID) | ORAL | 0 refills | Status: AC | PRN

## 2022-02-14 MED ORDER — FAMOTIDINE 20 MG PO TABS
20.0000 mg | ORAL_TABLET | Freq: Once | ORAL | Status: AC
  Administered 2022-02-14: 20 mg via ORAL
  Filled 2022-02-14: qty 1

## 2022-02-14 MED ORDER — LACTATED RINGERS IV BOLUS
1000.0000 mL | Freq: Once | INTRAVENOUS | Status: AC
  Administered 2022-02-14: 1000 mL via INTRAVENOUS

## 2022-02-14 MED ORDER — CEPHALEXIN 500 MG PO CAPS: 500.0000 mg | ORAL_CAPSULE | Freq: Two times a day (BID) | ORAL | 0 refills | Status: AC

## 2022-02-14 NOTE — ED Triage Notes (Addendum)
Pt with c/o L sided chest pain under her L breast. Pt also c/o acid reflux. Pt states she is unsure if she is having a panic attack or not (states has a hx of them). Pt recently lost her husband (was buried this week). Pt also states she has been out of her meds (anxiety, depression, cholesterol, sleep).

## 2022-02-14 NOTE — ED Provider Notes (Signed)
°  Provider Note MRN:  [id number]  Arrival date & time: 02/14/22    ED Course and Medical Decision Making  Assumed care from Dr Roxanne Mins  at shift change.  See not from prior team for complete details, in brief: 51 yo female with CP, spouse recently passed. UA is nitrite +'tive. Given IVF, rpt BMP pending. CXR negative. EKG stable.   Plan per prior physician if rpt labs stable would favor dc.   Repeat labs have resulted, they are stable.  Bicarb improved on metabolic panel.  Urinalysis concerning for infection given nitrite positive.  Low suspicion for pyelonephritis. Culture sent.  Start Keflex. She is tolerant of PO intake.   EKG stable, trop negative x2, CXR neg. Per prior physician recommend o/p cardio eval.  Reports when spouse passed she lost her insurance, she is applying for new insurance now. No longer has PCP. Given PCP f/u and [facility].   The patient improved significantly and was discharged in stable condition. Detailed discussions were had with the patient regarding current findings, and need for close f/u with PCP or on call doctor. The patient has been instructed to return immediately if the symptoms worsen in any way for re-evaluation. Patient verbalized understanding and is in agreement with current care plan. All questions answered prior to discharge.    Procedures  Final Clinical Impressions(s) / ED Diagnoses     ICD-10-CM   1. Atypical chest pain  R07.89     2. Metabolic acidosis  N40.76     3. Urinary tract infection without hematuria, site unspecified  N39.0     4. Anxiousness  F41.9       ED Discharge Orders          Ordered    Ambulatory referral to Cardiology        02/14/22 0745    cephALEXin (KEFLEX) 500 MG capsule  2 times daily        02/14/22 0834    sucralfate (CARAFATE) 1 g tablet  3 times daily with meals & bedtime        02/14/22 0849    diazepam (VALIUM) 5 MG tablet  Every 12 hours PRN        02/14/22 0849               Discharge Instructions      It was a pleasure caring for you today in the emergency department.  Please return to the emergency department for any worsening or worrisome symptoms.          Jeanell Sparrow, DO 02/14/22 [phone]

## 2022-02-14 NOTE — ED Provider Notes (Signed)
[facility] Provider Note   CSN: [id number] Arrival date & time: 02/14/22  0000     History  Chief Complaint  Patient presents with   Chest Pain    Sabrina Roberts is a 51 y.o. female.  The history is provided by the patient.  Chest Pain She has history of hypertension, diabetes, hyperlipidemia, anxiety, depression and comes in complaining of chest pain.  Pain is a dull, achy pain in the lower sternal area without radiation.  There is some associated dyspnea, nausea, diaphoresis.  Unfortunately, patient is a very inconsistent historian.  She told me that pain has been present for couple weeks and comes on with exertion and that the amount of exertion required is getting less.  She then told me that pain has been there for a long period of time and is related to anxiety and just got worse yesterday and has been constant since yesterday.  It is very difficult to understand which version of her history is accurate.  She is under stress and that her husband died 3 weeks ago.  She is a non-smoker.  She thinks that her symptoms may be related to anxiety and GERD.    Home Medications Prior to Admission medications   Medication Sig Start Date End Date Taking? Authorizing Provider  atorvastatin (LIPITOR) 10 MG tablet Take 1 tablet (10 mg total) by mouth daily. 09/19/21   Eulogio Bear, NP  Blood Glucose Monitoring Suppl (BLOOD GLUCOSE SYSTEM PAK) KIT Please dispense based on patient and insurance preference.  Use as directed to monitor FSBS 1-2 times daily.  Dx E11.9 05/01/21   Eulogio Bear, NP  Cholecalciferol (VITAMIN D3) 250 MCG (10000 UT) capsule Take 1 capsule (10,000 Units total) by mouth daily. 03/21/21   Eulogio Bear, NP  Dulaglutide (TRULICITY) 3 BO/1.7PZ SOPN Inject 3 mg as directed once a week. 10/31/21   Eulogio Bear, NP  DULoxetine (CYMBALTA) 60 MG capsule Take 1 capsule (60 mg total) by mouth daily. 10/31/21   Eulogio Bear, NP   ferrous sulfate 325 (65 FE) MG tablet Take 325 mg by mouth daily with breakfast.    [provider]  glucose blood test strip Please dispense based on patient and insurance preference.  Use as directed to monitor FSBS 1-2 times daily.  Dx E11.9 05/01/21   Eulogio Bear, NP  HYDROcodone-acetaminophen (NORCO) 5-325 MG tablet Take 1 tablet by mouth every 6 (six) hours as needed for severe pain. 10/12/21   Margarita Mail, PA-C  Lancets MISC Please dispense based on patient and insurance preference.  Use as directed to monitor FSBS 1-2 times daily.  Dx E11.9 05/01/21   Eulogio Bear, NP  moxifloxacin (VIGAMOX) 0.5 % ophthalmic solution Place 1 drop into the left eye 4 (four) times daily. 10/12/21   Margarita Mail, PA-C  Multiple Vitamin (MULTIVITAMIN) tablet Take 1 tablet by mouth daily.     [provider]  pregabalin (LYRICA) 300 MG capsule Take 1 capsule (300 mg total) by mouth daily. 10/31/21   Eulogio Bear, NP  QUEtiapine (SEROQUEL) 25 MG tablet TAKE 1 TABLET BY MOUTH AT BEDTIME 01/02/22   Eulogio Bear, NP  rOPINIRole (REQUIP) 0.5 MG tablet Take 0.5-1.5 mg by mouth at bedtime. 10/20/21   [provider]  tizanidine (ZANAFLEX) 2 MG capsule Take 1 capsule (2 mg total) by mouth 3 (three) times daily as needed for muscle spasms. Take first dose at night time and monitor  for drowsiness.  Do not take while driving or operating heavy machinery if this medication makes you drowsy. 08/13/21   Eulogio Bear, NP      Allergies    Actos [pioglitazone hydrochloride] and Latex    Review of Systems   Review of Systems  Cardiovascular:  Positive for chest pain.  All other systems reviewed and are negative.  Physical Exam Updated Vital Signs BP (!) 145/88    Pulse 96    Resp (!) 21    Ht '5\' 3"'  (1.6 m)    Wt 97.1 kg    LMP  (LMP Unknown)    SpO2 98%    BMI 37.91 kg/m  Physical Exam Vitals and nursing note reviewed.  51 year old female, resting  comfortably and in no acute distress. Vital signs are significant for borderline elevated respiratory rate and mildly elevated blood pressure. Oxygen saturation is 98%, which is normal. Head is normocephalic and atraumatic. PERRLA, EOMI. Oropharynx is clear. Neck is nontender and supple without adenopathy or JVD. Back is nontender and there is no CVA tenderness. Lungs are clear without rales, wheezes, or rhonchi. Chest is nontender. Heart has regular rate and rhythm without murmur. Abdomen is soft, flat, nontender. Extremities have no cyanosis or edema, full range of motion is present. Skin is warm and dry without rash. Neurologic: Mental status is normal, cranial nerves are intact, moves all extremities equally.  ED Results / Procedures / Treatments   Labs (all labs ordered are listed, but only abnormal results are displayed) Labs Reviewed  BASIC METABOLIC PANEL - Abnormal; Notable for the following components:      Result Value   CO2 15 (*)    Creatinine, Ser 1.04 (*)    All other components within normal limits  POC URINE PREG, ED  TROPONIN I (HIGH SENSITIVITY)  TROPONIN I (HIGH SENSITIVITY)    EKG EKG Interpretation  Date/Time:  Saturday February 14 2022 00:20:00 EST Ventricular Rate:  100 PR Interval:  133 QRS Duration: 74 QT Interval:  321 QTC Calculation: 414 R Axis:   18 Text Interpretation: Sinus tachycardia Low voltage, precordial leads Otherwise within normal limits When compared with ECG of 06/28/2021, No significant change was found Confirmed by Delora Fuel ([id number]) on 02/14/2022 12:24:21 AM  Radiology DG Chest Portable 1 View  Result Date: 02/14/2022 CLINICAL DATA:  Lower left-sided chest pain and shortness of breath. EXAM: PORTABLE CHEST 1 VIEW COMPARISON:  August 07, 2021 FINDINGS: The heart size and mediastinal contours are within normal limits. Both lungs are clear. The visualized skeletal structures are unremarkable. IMPRESSION: No active disease.  Electronically Signed   By: Virgina Norfolk M.D.   On: 02/14/2022 00:45    Procedures Procedures  Cardiac monitor, per my interpretation, shows sinus tachycardia.  Medications Ordered in ED Medications  aspirin chewable tablet 324 mg (has no administration in time range)  nitroGLYCERIN (NITROSTAT) SL tablet 0.4 mg (has no administration in time range)  lactated ringers bolus 1,000 mL (has no administration in time range)    ED Course/ Medical Decision Making/ A&P                           Medical Decision Making Amount and/or Complexity of Data Reviewed Labs: ordered. Radiology: ordered.  Risk OTC drugs. Prescription drug management.   Chest pain of uncertain cause.  Patient's history is inconsistent.  There are times when she describes what is likely exertional angina, possibly unstable  angina.  There are times when she describes what sounds like anxiety.  Consider GERD.  Doubt pulmonary embolism.  With recent major life event, consider Takotsubo's cardiomyopathy.  ECG shows no acute findings.  Chest x-ray shows no active disease.  I have independently viewed the images, and agree with radiologist's interpretation.  Labs are significant for CO2 of 15 but with a normal anion gap.  Troponin is normal x2.  Low CO2 could be a lab error, but she is also on dulaglutide which is associated with ketoacidosis with normal blood glucose.  She could also have a lactic acidosis.  She will be given IV fluids and will recheck metabolic panel and also get VBG, lactic acid level, beta hydroxybutyrate level.  Case is signed out to Dr. Pearline Cables.        Final Clinical Impression(s) / ED Diagnoses Final diagnoses:  Atypical chest pain  Metabolic acidosis    Rx / DC Orders ED Discharge Orders     None         Delora Fuel, MD 93/81/82 [id number]

## 2022-02-14 NOTE — Discharge Instructions (Addendum)
It was a pleasure caring for you today in the emergency department. ° °Please return to the emergency department for any worsening or worrisome symptoms. ° ° °

## 2022-02-16 ENCOUNTER — Encounter: Payer: Self-pay | Admitting: Pulmonary Disease

## 2022-02-16 LAB — URINE CULTURE: Culture: >=100000 — AB

## 2022-02-17 ENCOUNTER — Encounter: Payer: Self-pay | Admitting: Internal Medicine

## 2022-02-17 ENCOUNTER — Telehealth: Payer: Self-pay | Admitting: *Deleted

## 2022-02-17 ENCOUNTER — Other Ambulatory Visit: Payer: Self-pay

## 2022-02-17 ENCOUNTER — Ambulatory Visit (INDEPENDENT_AMBULATORY_CARE_PROVIDER_SITE_OTHER): Payer: Self-pay | Admitting: Internal Medicine

## 2022-02-17 VITALS — BP 122/86 | HR 102 | Ht 64.0 in | Wt 202.0 lb

## 2022-02-17 DIAGNOSIS — R0609 Other forms of dyspnea: Secondary | ICD-10-CM

## 2022-02-17 DIAGNOSIS — R079 Chest pain, unspecified: Secondary | ICD-10-CM

## 2022-02-17 DIAGNOSIS — E119 Type 2 diabetes mellitus without complications: Secondary | ICD-10-CM

## 2022-02-17 DIAGNOSIS — E782 Mixed hyperlipidemia: Secondary | ICD-10-CM

## 2022-02-17 NOTE — Progress Notes (Signed)
OFFICE CONSULT NOTE  Chief Complaint:  Chest pain  Primary Care Physician: Eulogio Bear, NP  HPI:  Sabrina Roberts is a 51 y.o. female who is being seen today for the evaluation of chest pain at the request of Delora Fuel, MD. this is a pleasant 51 year old female kindly referred by the emergency department for evaluation management of chest pain.  She was seen a few days ago with constant chest pain has been going on for about a week.  Unfortunately her husband died suddenly in his sleep.  He had a history of coronary disease per her report despite his young age.  She reports being under quite a bit of stress.  In addition she was on a lengthy list of medications from her primary provider Dr. Buelah Manis, however she left the practice and she has been managed by Ms. Edsel Petrin.  Recently she was informed that Ms. Hassell Done has had also left the practice.  Therefore, she has been out of a number of her anxiolytic medications.  She was seen in the ER and had an EKG which showed sinus rhythm with no ischemic changes.  2 troponins were negative.  As mentioned chest pain sounded somewhat atypical including the fact that it was pretty much constant however worsened somewhat in the evenings and improved some time in the mornings.  It was at rest.  She did report some shortness of breath with exertion.  She was concerned because her husband also had some shortness of breath.  Blood pressure here was excellent and oxygen saturation is normal.  She is slightly tachycardic today.  PMHx:  Past Medical History:  Diagnosis Date   Anemia    Anxiety    Arthritis    Depression    Diabetes mellitus    Hyperlipidemia    Hypertension    Obesity    Sleep apnea    Has CPAP but has lost weight and hasnt used    Past Surgical History:  Procedure Laterality Date   CARPAL TUNNEL RELEASE Right 10/01/2016   Procedure: RIGHT CARPAL TUNNEL RELEASE;  Surgeon: Carole Civil, MD;  Location: [facility];  Service:  Orthopedics;  Laterality: Right;   CARPAL TUNNEL RELEASE Left 10/22/2016   Procedure: CARPAL TUNNEL RELEASE;  Surgeon: Carole Civil, MD;  Location: [facility];  Service: Orthopedics;  Laterality: Left;   CESAREAN SECTION  2002   CHOLECYSTECTOMY  dec 2012   GASTRIC BYPASS  dec 2012   KNEE ARTHROSCOPY Right     FAMHx:  Family History  Problem Relation Age of Onset   Hypertension Mother    Depression Mother    Diabetes Mother    Irritable bowel syndrome Mother    Heart disease Mother    Stroke Mother    Hypertension Father    Hyperlipidemia Father    Prostate cancer Father    Colon polyps Father    Heart disease Father    Kidney Stones Father    Diverticulitis Father    Colon cancer Father    Breast cancer Paternal Grandmother    Colon cancer Paternal Grandfather    Diabetes Maternal Grandmother    Breast cancer Maternal Aunt    Stomach cancer Paternal Uncle    Colon cancer Other        Paternal Great Uncles x 4   Colon polyps Paternal Aunt        x 3    SOCHx:   reports that she has never smoked. She has  been exposed to tobacco smoke. She has never used smokeless tobacco. She reports current alcohol use. She reports that she does not use drugs.  ALLERGIES:  Allergies  Allergen Reactions   Actos [Pioglitazone Hydrochloride] Anaphylaxis, Hives and Other (See Comments)    Chest pain    Latex Rash    ROS: Pertinent items noted in HPI and remainder of comprehensive ROS otherwise negative.  HOME MEDS: Current Outpatient Medications on File Prior to Visit  Medication Sig Dispense Refill   atorvastatin (LIPITOR) 10 MG tablet Take 1 tablet (10 mg total) by mouth daily. 90 tablet 1   Blood Glucose Monitoring Suppl (BLOOD GLUCOSE SYSTEM PAK) KIT Please dispense based on patient and insurance preference.  Use as directed to monitor FSBS 1-2 times daily.  Dx E11.9 1 kit 1   cephALEXin (KEFLEX) 500 MG capsule Take 1 capsule (500 mg total) by mouth 2 (two) times daily for 7  days. 14 capsule 0   Cholecalciferol (VITAMIN D3) 250 MCG (10000 UT) capsule Take 1 capsule (10,000 Units total) by mouth daily. 90 capsule 0   diazepam (VALIUM) 5 MG tablet Take 1 tablet (5 mg total) by mouth every 12 (twelve) hours as needed for anxiety. 5 tablet 0   Dulaglutide (TRULICITY) 3 HQ/7.5FF SOPN Inject 3 mg as directed once a week. 12 mL 1   DULoxetine (CYMBALTA) 60 MG capsule Take 1 capsule (60 mg total) by mouth daily. 90 capsule 1   glucose blood test strip Please dispense based on patient and insurance preference.  Use as directed to monitor FSBS 1-2 times daily.  Dx E11.9 100 each 11   HYDROcodone-acetaminophen (NORCO) 5-325 MG tablet Take 1 tablet by mouth every 6 (six) hours as needed for severe pain. 6 tablet 0   Lancets MISC Please dispense based on patient and insurance preference.  Use as directed to monitor FSBS 1-2 times daily.  Dx E11.9 100 each 11   Multiple Vitamin (MULTIVITAMIN) tablet Take 1 tablet by mouth daily.      QUEtiapine (SEROQUEL) 25 MG tablet TAKE 1 TABLET BY MOUTH AT BEDTIME 90 tablet 3   rOPINIRole (REQUIP) 0.5 MG tablet Take 0.5-1.5 mg by mouth at bedtime.     ferrous sulfate 325 (65 FE) MG tablet Take 325 mg by mouth daily with breakfast. (Patient not taking: Reported on 02/17/2022)     moxifloxacin (VIGAMOX) 0.5 % ophthalmic solution Place 1 drop into the left eye 4 (four) times daily. (Patient not taking: Reported on 02/17/2022) 3 mL 0   pregabalin (LYRICA) 300 MG capsule Take 1 capsule (300 mg total) by mouth daily. (Patient not taking: Reported on 02/17/2022) 90 capsule 1   sucralfate (CARAFATE) 1 g tablet Take 1 tablet (1 g total) by mouth 4 (four) times daily -  with meals and at bedtime for 7 days. (Patient not taking: Reported on 02/17/2022) 28 tablet 0   tizanidine (ZANAFLEX) 2 MG capsule Take 1 capsule (2 mg total) by mouth 3 (three) times daily as needed for muscle spasms. Take first dose at night time and monitor for drowsiness.  Do not take while  driving or operating heavy machinery if this medication makes you drowsy. (Patient not taking: Reported on 02/17/2022) 30 capsule 0   Current Facility-Administered Medications on File Prior to Visit  Medication Dose Route Frequency Provider Last Rate Last Admin   cyanocobalamin ((VITAMIN B-12)) injection 1,000 mcg  1,000 mcg Intramuscular Q30 days Alycia Rossetti, MD   1,000 mcg at 05/01/21 1630  LABS/IMAGING: No results found for this or any previous visit (from the past 48 hour(s)). No results found.  LIPID PANEL:    Component Value Date/Time   CHOL 200 (H) 10/31/2021 0928   CHOL 172 10/25/2014 1122   TRIG 112 10/31/2021 0928   TRIG 82 10/25/2014 1122   HDL 76 10/31/2021 0928   HDL 56 10/25/2014 1122   CHOLHDL 2.6 10/31/2021 0928   VLDL 19 06/08/2017 1038   VLDL 16 10/25/2014 1122   LDLCALC 103 (H) 10/31/2021 0928   LDLCALC 100 10/25/2014 1122    WEIGHTS: Wt Readings from Last 3 Encounters:  02/17/22 202 lb (91.6 kg)  02/14/22 214 lb (97.1 kg)  10/31/21 209 lb (94.8 kg)    VITALS: BP 122/86    Pulse (!) 102    Ht '5\' 4"'  (1.626 m)    Wt 202 lb (91.6 kg)    LMP  (LMP Unknown)    SpO2 97%    BMI 34.67 kg/m   EXAM: General appearance: alert and no distress Neck: no carotid bruit, no JVD, supple, symmetrical, trachea midline, and thyroid not enlarged, symmetric, no tenderness/mass/nodules Lungs: clear to auscultation bilaterally Heart: regular rate and rhythm, S1, S2 normal, no murmur, click, rub or gallop Abdomen: soft, non-tender; bowel sounds normal; no masses,  no organomegaly Extremities: extremities normal, atraumatic, no cyanosis or edema Pulses: 2+ and symmetric Skin: Skin color, texture, turgor normal. No rashes or lesions Neurologic: Grossly normal Psych: Pleasant, mildly anxious  EKG: ER EKG reviewed, demonstrating sinus tachycardia with no ischemic changes- personally reviewed  ASSESSMENT: Suspect noncardiac chest pain Dyspnea Anxiety  PLAN: 1.    She is describing somewhat atypical or noncardiac chest pain.  She has reported some shortness of breath somewhat worse recently after the loss of her husband.  I would like to get an echocardiogram to rule out some type of a stress-induced cardiomyopathy.  In addition we will get a calcium score which would be potentially an expensive way to determine if she has any coronary disease or not it may be reassuring if it is 0.  She does have some cardiovascular risk factors including dyslipidemia and diabetes.  Plan follow-up with me afterwards.  Thanks again for the kind referral  Pixie Casino, MD, FACC, [place] Director of the [facility]  [facility] Diplomate of the American Board of Clinical Lipidology Attending Cardiologist  Direct Dial: [phone]   Fax: [phone]  Website:  [email or web address] 02/17/2022, 4:35 PM

## 2022-02-17 NOTE — Telephone Encounter (Signed)
Post ED Visit - Positive Culture Follow-up  Culture report reviewed by antimicrobial stewardship pharmacist: [facility] Team []  Elenor Quinones, Pharm.D. []  Heide Guile, Pharm.D., BCPS AQ-ID []  Parks Neptune, Pharm.D., BCPS []  Alycia Rossetti, Pharm.D., BCPS []  [place], Pharm.D., BCPS, AAHIVP []  Legrand Como, Pharm.D., BCPS, AAHIVP []  Salome Arnt, PharmD, BCPS []  Johnnette Gourd, PharmD, BCPS []  Hughes Better, PharmD, BCPS []  Leeroy Cha, PharmD []  Laqueta Linden, PharmD, BCPS []  Albertina Parr, PharmD  [place] Team []  Leodis Sias, PharmD []  Lindell Spar, PharmD []  Royetta Asal, PharmD []  Graylin Shiver, Rph []  Rema Fendt) Glennon Mac, PharmD []  Arlyn Dunning, PharmD []  Netta Cedars, PharmD []  Dia Sitter, PharmD []  Leone Haven, PharmD []  Gretta Arab, PharmD []  Theodis Shove, PharmD []  Peggyann Juba, PharmD []  Reuel Boom, PharmD   Positive urine culture Treated with Cephalexin, organism sensitive to the same and no further patient follow-up is required at this time.  Cristela Felt, PharmD  Harlon Flor Talley 02/17/2022, 9:40 AM

## 2022-02-17 NOTE — Patient Instructions (Signed)
°  Testing/Procedures: Your physician has requested that you have an echocardiogram. Echocardiography is a painless test that uses sound waves to create images of your heart. It provides your doctor with information about the size and shape of your heart and how well your hearts chambers and valves are working. This procedure takes approximately one hour. There are no restrictions for this procedure.  Calcium Score -$99.00  Follow-Up: Follow up with Dr. Paschal Dopp in 3 months.   Any Other Special Instructions Will Be Listed Below (If Applicable).     If you need a refill on your cardiac medications before your next appointment, please call your pharmacy.

## 2022-02-19 ENCOUNTER — Other Ambulatory Visit (HOSPITAL_COMMUNITY): Payer: Medicaid Other

## 2022-02-20 ENCOUNTER — Other Ambulatory Visit (HOSPITAL_COMMUNITY): Payer: Medicaid Other

## 2022-02-25 ENCOUNTER — Ambulatory Visit (HOSPITAL_COMMUNITY): Admission: RE | Admit: 2022-02-25 | Payer: Medicaid Other | Source: Ambulatory Visit

## 2022-03-06 ENCOUNTER — Telehealth: Payer: Self-pay

## 2022-03-06 NOTE — Telephone Encounter (Signed)
PA for Trulicity started, form completed and faxed with OV note and recent labs to [facility] [phone].  ?

## 2022-03-11 NOTE — Telephone Encounter (Signed)
Fax received from [facility] ? ?PA for Trulicity has been APPROVED ? ?03/06/22-03/07/23 ?

## 2022-04-13 ENCOUNTER — Ambulatory Visit: Payer: Medicaid Other | Admitting: Registered Nurse

## 2022-05-22 ENCOUNTER — Ambulatory Visit: Payer: Medicaid Other | Admitting: Internal Medicine

## 2022-05-22 ENCOUNTER — Encounter: Payer: Self-pay | Admitting: Internal Medicine

## 2022-05-28 ENCOUNTER — Other Ambulatory Visit: Payer: Self-pay | Admitting: Nurse Practitioner

## 2022-05-28 DIAGNOSIS — F331 Major depressive disorder, recurrent, moderate: Secondary | ICD-10-CM

## 2022-05-28 DIAGNOSIS — F411 Generalized anxiety disorder: Secondary | ICD-10-CM

## 2022-05-29 NOTE — Telephone Encounter (Signed)
Requested medication (s) are due for refill today: yes  Requested medication (s) are on the active medication list: yes  Last refill:  10/31/21 #90 1 RF  Future visit scheduled: has upcoming new pt appt- can pt have 30 day courtesy RF?  Notes to clinic:  see above   Requested Prescriptions  Pending Prescriptions Disp Refills   DULoxetine (CYMBALTA) 60 MG capsule [Pharmacy Med Name: DULoxetine HCl 60 MG Oral Capsule Delayed Release Particles] 90 capsule 0    Sig: Take 1 capsule by mouth once daily     Psychiatry: Antidepressants - SNRI - duloxetine Failed - 05/28/2022  5:39 PM      Failed - Valid encounter within last 6 months    Recent Outpatient Visits           7 months ago Annual physical exam   [facility] Martinez, Jessica A, NP   8 months ago Moderate episode of recurrent major depressive disorder (HCC)   [facility] Martinez, Jessica A, NP   9 months ago Cough   [facility] Martinez, Jessica A, NP   11 months ago Type 2 diabetes mellitus without complication, without long-term current use of insulin (HCC)   [facility] Martinez, Jessica A, NP   11 months ago Moderate episode of recurrent major depressive disorder (HCC)   [facility] Martinez, Jessica A, NP       Future Appointments             In 3 weeks Morrow, Richard, NP [facility]             Passed - Cr in normal range and within 360 days    Creat  Date Value Ref Range Status  10/31/2021 1.79 (H) 0.50 - 1.03 mg/dL Final   Creatinine, Ser  Date Value Ref Range Status  02/14/2022 0.79 0.44 - 1.00 mg/dL Final   Creatinine, Urine  Date Value Ref Range Status  03/19/2021 97 20 - 275 mg/dL Final         Passed - eGFR is 30 or above and within 360 days    GFR, Est African American  Date Value Ref Range Status  07/02/2021 83 > OR = 60 mL/min/1.73m2 Final   GFR, Est Non  African American  Date Value Ref Range Status  07/02/2021 71 > OR = 60 mL/min/1.73m2 Final   GFR, Estimated  Date Value Ref Range Status  02/14/2022 >60 >60 mL/min Final    Comment:    (NOTE) Calculated using the CKD-EPI Creatinine Equation (2021)    eGFR  Date Value Ref Range Status  10/31/2021 34 (L) > OR = 60 mL/min/1.73m2 Final    Comment:    The eGFR is based on the CKD-EPI 2021 equation. To calculate  the new eGFR from a previous Creatinine or Cystatin C result, go to https://www.kidney.org/professionals/ kdoqi/gfr%5Fcalculator          Passed - Completed PHQ-2 or PHQ-9 in the last 360 days      Passed - Last BP in normal range    BP Readings from Last 1 Encounters:  02/17/22 122/86             

## 2022-06-03 MED ORDER — DULOXETINE HCL 60 MG PO CPEP: 60.0000 mg | ORAL_CAPSULE | Freq: Every day | ORAL | 0 refills | Status: DC

## 2022-06-03 NOTE — Telephone Encounter (Deleted)
Pt was not given a 30 day refill from previous provider- has not had new pt appt with new PCP. Can she have a 30 day refill?

## 2022-06-03 NOTE — Telephone Encounter (Signed)
Pt has not has a refill since 10/31/21 and was given 6 month supply.

## 2022-06-03 NOTE — Telephone Encounter (Signed)
Pt given 30 day refill as a courtesy- former pt of Noemi Chapel NP-as per protocol

## 2022-06-03 NOTE — Addendum Note (Signed)
Addended by: Carlisle Beers on: 06/03/2022 03:44 PM   Modules accepted: Orders

## 2022-06-24 ENCOUNTER — Ambulatory Visit: Payer: 59 | Admitting: Registered Nurse

## 2022-06-27 ENCOUNTER — Other Ambulatory Visit: Payer: Self-pay | Admitting: Family Medicine

## 2022-06-27 DIAGNOSIS — F411 Generalized anxiety disorder: Secondary | ICD-10-CM

## 2022-06-27 DIAGNOSIS — F331 Major depressive disorder, recurrent, moderate: Secondary | ICD-10-CM

## 2022-06-29 NOTE — Telephone Encounter (Signed)
PCP for patient no longer at practice Requested Prescriptions  Pending Prescriptions Disp Refills  . DULoxetine (CYMBALTA) 60 MG capsule [Pharmacy Med Name: DULoxetine HCl 60 MG Oral Capsule Delayed Release Particles] 30 capsule 0    Sig: Take 1 capsule by mouth once daily     Psychiatry: Antidepressants - SNRI - duloxetine Failed - 06/27/2022 12:31 PM      Failed - Valid encounter within last 6 months    Recent Outpatient Visits          8 months ago Annual physical exam   [facility] Martinez, Jessica A, NP   9 months ago Moderate episode of recurrent major depressive disorder (HCC)   [facility] Martinez, Jessica A, NP   10 months ago Cough   [facility] Martinez, Jessica A, NP   12 months ago Type 2 diabetes mellitus without complication, without long-term current use of insulin (HCC)   [facility] Martinez, Jessica A, NP   1 year ago Moderate episode of recurrent major depressive disorder (HCC)   [facility] Martinez, Jessica A, NP             Passed - Cr in normal range and within 360 days    Creat  Date Value Ref Range Status  10/31/2021 1.79 (H) 0.50 - 1.03 mg/dL Final   Creatinine, Ser  Date Value Ref Range Status  02/14/2022 0.79 0.44 - 1.00 mg/dL Final   Creatinine, Urine  Date Value Ref Range Status  03/19/2021 97 20 - 275 mg/dL Final         Passed - eGFR is 30 or above and within 360 days    GFR, Est African American  Date Value Ref Range Status  07/02/2021 83 > OR = 60 mL/min/1.73m2 Final   GFR, Est Non African American  Date Value Ref Range Status  07/02/2021 71 > OR = 60 mL/min/1.73m2 Final   GFR, Estimated  Date Value Ref Range Status  02/14/2022 >60 >60 mL/min Final    Comment:    (NOTE) Calculated using the CKD-EPI Creatinine Equation (2021)    eGFR  Date Value Ref Range Status  10/31/2021 34 (L) > OR = 60 mL/min/1.73m2 Final    Comment:    The eGFR is  based on the CKD-EPI 2021 equation. To calculate  the new eGFR from a previous Creatinine or Cystatin C result, go to https://www.kidney.org/professionals/ kdoqi/gfr%5Fcalculator          Passed - Completed PHQ-2 or PHQ-9 in the last 360 days      Passed - Last BP in normal range    BP Readings from Last 1 Encounters:  02/17/22 122/86          

## 2022-10-14 ENCOUNTER — Ambulatory Visit: Payer: 59 | Admitting: Family Medicine

## 2022-11-04 ENCOUNTER — Encounter: Payer: Self-pay | Admitting: Gastroenterology

## 2023-01-04 ENCOUNTER — Ambulatory Visit (INDEPENDENT_AMBULATORY_CARE_PROVIDER_SITE_OTHER): Payer: 59 | Admitting: Family Medicine

## 2023-01-04 ENCOUNTER — Encounter: Payer: Self-pay | Admitting: Family Medicine

## 2023-01-04 VITALS — BP 138/88 | HR 98 | Ht 64.0 in | Wt 219.1 lb

## 2023-01-04 DIAGNOSIS — Z1231 Encounter for screening mammogram for malignant neoplasm of breast: Secondary | ICD-10-CM | POA: Diagnosis not present

## 2023-01-04 DIAGNOSIS — M5442 Lumbago with sciatica, left side: Secondary | ICD-10-CM | POA: Diagnosis not present

## 2023-01-04 DIAGNOSIS — R69 Illness, unspecified: Secondary | ICD-10-CM | POA: Diagnosis not present

## 2023-01-04 DIAGNOSIS — E782 Mixed hyperlipidemia: Secondary | ICD-10-CM

## 2023-01-04 DIAGNOSIS — R5383 Other fatigue: Secondary | ICD-10-CM

## 2023-01-04 DIAGNOSIS — E559 Vitamin D deficiency, unspecified: Secondary | ICD-10-CM | POA: Diagnosis not present

## 2023-01-04 DIAGNOSIS — R7989 Other specified abnormal findings of blood chemistry: Secondary | ICD-10-CM | POA: Diagnosis not present

## 2023-01-04 DIAGNOSIS — Z1211 Encounter for screening for malignant neoplasm of colon: Secondary | ICD-10-CM

## 2023-01-04 DIAGNOSIS — F339 Major depressive disorder, recurrent, unspecified: Secondary | ICD-10-CM

## 2023-01-04 DIAGNOSIS — Z23 Encounter for immunization: Secondary | ICD-10-CM | POA: Diagnosis not present

## 2023-01-04 DIAGNOSIS — E119 Type 2 diabetes mellitus without complications: Secondary | ICD-10-CM

## 2023-01-04 DIAGNOSIS — Z114 Encounter for screening for human immunodeficiency virus [HIV]: Secondary | ICD-10-CM

## 2023-01-04 DIAGNOSIS — Z9884 Bariatric surgery status: Secondary | ICD-10-CM

## 2023-01-04 DIAGNOSIS — F5101 Primary insomnia: Secondary | ICD-10-CM

## 2023-01-04 DIAGNOSIS — Z1159 Encounter for screening for other viral diseases: Secondary | ICD-10-CM

## 2023-01-04 DIAGNOSIS — E785 Hyperlipidemia, unspecified: Secondary | ICD-10-CM

## 2023-01-04 DIAGNOSIS — G8929 Other chronic pain: Secondary | ICD-10-CM

## 2023-01-04 MED ORDER — ATORVASTATIN CALCIUM 10 MG PO TABS: 10.0000 mg | ORAL_TABLET | Freq: Every day | ORAL | 1 refills | Status: DC

## 2023-01-04 MED ORDER — TRULICITY 3 MG/0.5ML ~~LOC~~ SOAJ: 3.0000 mg | SUBCUTANEOUS | 2 refills | Status: DC

## 2023-01-04 MED ORDER — QUETIAPINE FUMARATE 25 MG PO TABS: 25.0000 mg | ORAL_TABLET | Freq: Every day | ORAL | 3 refills | Status: DC

## 2023-01-04 MED ORDER — DICLOFENAC SODIUM 75 MG PO TBEC: 75.0000 mg | DELAYED_RELEASE_TABLET | Freq: Two times a day (BID) | ORAL | 1 refills | Status: DC

## 2023-01-04 MED ORDER — VITAMIN D (ERGOCALCIFEROL) 1.25 MG (50000 UNIT) PO CAPS: 50000.0000 [IU] | ORAL_CAPSULE | ORAL | 1 refills | Status: DC

## 2023-01-04 MED ORDER — FERROUS SULFATE 325 (65 FE) MG PO TABS: 325.0000 mg | ORAL_TABLET | Freq: Every day | ORAL | 3 refills | Status: DC

## 2023-01-04 NOTE — Assessment & Plan Note (Signed)
She takes Trulicity 3 mg weekly subcu injection She denies polyuria, polyphagia and polydipsia Will assess hemoglobin A1c today

## 2023-01-04 NOTE — Assessment & Plan Note (Addendum)
She takes Cymbalta 60 mg daily She denies suicidal thoughts and ideation Requested referral to psychiatry Referral placed

## 2023-01-04 NOTE — Patient Instructions (Addendum)
  I appreciate the opportunity to provide care to you today!    Follow up:  3 months  Labs: please stop by the lab during the week to get your blood drawn (CBC, CMP, TSH, Lipid profile, HgA1c, Vit D)  Screening: HIV and Hep C  Please pick up your prescriptions at the pharmacy   Please schedule mammogram  Referrals today- psychiatry   Please continue to a heart-healthy diet and increase your physical activities. Try to exercise for 52mns at least three times a week.      It was a pleasure to see you and I look forward to continuing to work together on your health and well-being. Please do not hesitate to call the office if you need care or have questions about your care.   Have a wonderful day and week. With Gratitude, GAlvira MondayMSN, FNP-BC

## 2023-01-04 NOTE — Assessment & Plan Note (Addendum)
She takes Diflucan 75 mg twice daily, Zanaflex 2 mg 3 times daily as needed She follows up with Dr.Runheim in [place] Diflucan 75 mg twice daily refilled Encourage patient to follow-up with Dr. Trula Ore as needed

## 2023-01-04 NOTE — Progress Notes (Signed)
New Patient Office Visit  Subjective:  Patient ID: Sabrina Roberts, female    DOB: Mar 25, 1971  Age: 52 y.o. MRN: [id number]  CC:  Chief Complaint  Patient presents with   Establish Care    New patient previously seen by [facility]. Pt would like to establish care, needs routine blood work.     HPI Sabrina Roberts is a 52 y.o. female with past medical history of anxiety and depression, hyperlipidemia, generalized anxiety disorder, diarrhea, insomnia, panic attacks, and vitamin D deficiency presents for establishing care. For the details of today's visit, please refer to the assessment and plan.     Past Medical History:  Diagnosis Date   Anemia    Anxiety    Arthritis    Depression    Diabetes mellitus    Hyperlipidemia    Hypertension    Obesity    Sleep apnea    Has CPAP but has lost weight and hasnt used    Past Surgical History:  Procedure Laterality Date   CARPAL TUNNEL RELEASE Right 10/01/2016   Procedure: RIGHT CARPAL TUNNEL RELEASE;  Surgeon: Carole Civil, MD;  Location: [facility];  Service: Orthopedics;  Laterality: Right;   CARPAL TUNNEL RELEASE Left 10/22/2016   Procedure: CARPAL TUNNEL RELEASE;  Surgeon: Carole Civil, MD;  Location: [facility];  Service: Orthopedics;  Laterality: Left;   CESAREAN SECTION  2002   CHOLECYSTECTOMY  dec 2012   GASTRIC BYPASS  dec 2012   KNEE ARTHROSCOPY Right     Family History  Problem Relation Age of Onset   Hypertension Mother    Depression Mother    Diabetes Mother    Irritable bowel syndrome Mother    Heart disease Mother    Stroke Mother    Hypertension Father    Hyperlipidemia Father    Prostate cancer Father    Colon polyps Father    Heart disease Father    Kidney Stones Father    Diverticulitis Father    Colon cancer Father    Breast cancer Paternal Grandmother    Colon cancer Paternal Grandfather    Diabetes Maternal Grandmother    Breast cancer Maternal Aunt    Stomach cancer Paternal  Uncle    Colon cancer Other        Paternal Great Uncles x 4   Colon polyps Paternal Aunt        x 3    Social History   Socioeconomic History   Marital status: Married    Spouse name: Not on file   Number of children: Not on file   Years of education: Not on file   Highest education level: Not on file  Occupational History   Not on file  Tobacco Use   Smoking status: Never    Passive exposure: Current   Smokeless tobacco: Never  Vaping Use   Vaping Use: Never used  Substance and Sexual Activity   Alcohol use: Yes    Alcohol/week: 0.0 standard drinks of alcohol    Comment: occasionally   Drug use: No   Sexual activity: Yes    Partners: Male    Birth control/protection: None  Other Topics Concern   Not on file  Social History Narrative   Not on file   Social Determinants of Health   Financial Resource Strain: Not on file  Food Insecurity: Not on file  Transportation Needs: Not on file  Physical Activity: Not on file  Stress: Not on file  Social Connections: Not on file  Intimate Partner Violence: Not on file    ROS Review of Systems  Constitutional:  Negative for chills and fever.  Eyes:  Negative for visual disturbance.  Respiratory:  Negative for chest tightness and shortness of breath.   Musculoskeletal:  Positive for back pain.  Neurological:  Negative for dizziness and headaches.    Objective:   Today's Vitals: BP 138/88   Pulse 98   Ht '5\' 4"'$  (1.626 m)   Wt 219 lb 1.9 oz (99.4 kg)   LMP  (LMP Unknown)   SpO2 96%   BMI 37.61 kg/m   Physical Exam HENT:     Head: Normocephalic.     Mouth/Throat:     Mouth: Mucous membranes are moist.  Cardiovascular:     Rate and Rhythm: Normal rate.     Heart sounds: Normal heart sounds.  Pulmonary:     Effort: Pulmonary effort is normal.     Breath sounds: Normal breath sounds.  Musculoskeletal:     Comments: Mild pain with palpation of the lumbar musculature Negative CVA tenderness No overlying skin  changes  Neurological:     Mental Status: She is alert.      Assessment & Plan:   Type 2 diabetes mellitus without complication, without long-term current use of insulin (HCC) Assessment & Plan: She takes Trulicity 3 mg weekly subcu injection She denies polyuria, polyphagia and polydipsia Will assess hemoglobin A1c today  Orders: -     Microalbumin / creatinine urine ratio -     Hemoglobin O2U -     Trulicity; Inject 3 mg as directed once a week.  Dispense: 3 mL; Refill: 2  Chronic midline low back pain with left-sided sciatica Assessment & Plan: She takes Diflucan 75 mg twice daily, Zanaflex 2 mg 3 times daily as needed She follows up with Dr.Runheim in [place] Diflucan 75 mg twice daily refilled Encourage patient to follow-up with Dr. Trula Ore as needed   Orders: -     Diclofenac Sodium; Take 1 tablet (75 mg total) by mouth 2 (two) times daily.  Dispense: 60 tablet; Refill: 1  Recurrent major depressive disorder, remission status unspecified ([place]) Assessment & Plan: She takes Cymbalta 60 mg daily She denies suicidal thoughts and ideation Requested referral to psychiatry Referral placed  Orders: -     QUEtiapine Fumarate; Take 1 tablet (25 mg total) by mouth at bedtime.  Dispense: 90 tablet; Refill: 3 -     Ambulatory referral to Psychiatry  Need for immunization against influenza Assessment & Plan: Patient educated on CDC recommendation for the vaccine. Verbal consent was obtained from the patient, vaccine administered by nurse, no sign of adverse reactions noted at this time. Patient education on arm soreness and use of tylenol or ibuprofen for this patient  was discussed. Patient educated on the signs and symptoms of adverse effect and advise to contact the office if they occur.    Flu vaccine need -     Flu Vaccine QUAD 53moIM (Fluarix, Fluzone & Alfiuria Quad PF)  Encounter for screening colonoscopy  Colon cancer screening -     Ambulatory referral to  Gastroenterology  Breast cancer screening by mammogram -     3D Screening Mammogram, Left and Right; Future  Immunization due -     Varicella-zoster vaccine IM  Mixed hyperlipidemia Assessment & Plan: She takes Lipitor 10 mg daily She denies muscle aches and pain Will assess lipid panel today  Orders: -  Lipid panel -     CMP14+EGFR  Vitamin D deficiency -     VITAMIN D 25 Hydroxy (Vit-D Deficiency, Fractures) -     Vitamin D (Ergocalciferol); Take 1 capsule (50,000 Units total) by mouth every 7 (seven) days.  Dispense: 10 capsule; Refill: 1  Abnormal TSH -     CBC with Differential/Platelet -     TSH  History of gastric bypass  Hyperlipidemia, unspecified hyperlipidemia type Assessment & Plan: She takes Lipitor 10 mg daily She denies muscle aches and pain Will assess lipid panel today  Orders: -     Atorvastatin Calcium; Take 1 tablet (10 mg total) by mouth daily.  Dispense: 90 tablet; Refill: 1  Primary insomnia -     QUEtiapine Fumarate; Take 1 tablet (25 mg total) by mouth at bedtime.  Dispense: 90 tablet; Refill: 3  Fatigue, unspecified type -     Ferrous Sulfate; Take 1 tablet (325 mg total) by mouth daily with breakfast.  Dispense: 30 tablet; Refill: 3  Need for hepatitis C screening test -     Hepatitis C antibody  Encounter for screening for HIV -     HIV Antibody (routine testing w rflx)     Follow-up: Return in about 3 months (around 04/05/2023).   Alvira Monday, FNP

## 2023-01-04 NOTE — Assessment & Plan Note (Signed)
She takes Lipitor 10 mg daily She denies muscle aches and pain Will assess lipid panel today

## 2023-01-04 NOTE — Assessment & Plan Note (Signed)
Patient educated on CDC recommendation for the vaccine. Verbal consent was obtained from the patient, vaccine administered by nurse, no sign of adverse reactions noted at this time. Patient education on arm soreness and use of tylenol or ibuprofen for this patient  was discussed. Patient educated on the signs and symptoms of adverse effect and advise to contact the office if they occur.  

## 2023-01-05 ENCOUNTER — Other Ambulatory Visit: Payer: Self-pay

## 2023-01-13 DIAGNOSIS — E119 Type 2 diabetes mellitus without complications: Secondary | ICD-10-CM | POA: Diagnosis not present

## 2023-01-13 DIAGNOSIS — E559 Vitamin D deficiency, unspecified: Secondary | ICD-10-CM | POA: Diagnosis not present

## 2023-01-13 DIAGNOSIS — E782 Mixed hyperlipidemia: Secondary | ICD-10-CM | POA: Diagnosis not present

## 2023-01-13 DIAGNOSIS — R69 Illness, unspecified: Secondary | ICD-10-CM | POA: Diagnosis not present

## 2023-01-13 DIAGNOSIS — Z1159 Encounter for screening for other viral diseases: Secondary | ICD-10-CM | POA: Diagnosis not present

## 2023-01-13 DIAGNOSIS — R7989 Other specified abnormal findings of blood chemistry: Secondary | ICD-10-CM | POA: Diagnosis not present

## 2023-01-14 LAB — HEPATITIS C ANTIBODY: Hep C Virus Ab: NONREACTIVE

## 2023-01-14 LAB — HIV ANTIBODY (ROUTINE TESTING W REFLEX): HIV Screen 4th Generation wRfx: NONREACTIVE

## 2023-01-15 LAB — CBC WITH DIFFERENTIAL/PLATELET
Basophils Absolute: 0 10*3/uL (ref 0.0–0.2)
Basos: 1 %
EOS (ABSOLUTE): 0.2 10*3/uL (ref 0.0–0.4)
Eos: 3 %
Hematocrit: 34.4 % (ref 34.0–46.6)
Hemoglobin: 11.1 g/dL (ref 11.1–15.9)
Immature Grans (Abs): 0 10*3/uL (ref 0.0–0.1)
Immature Granulocytes: 0 %
Lymphocytes Absolute: 2.3 10*3/uL (ref 0.7–3.1)
Lymphs: 39 %
MCH: 32.3 pg (ref 26.6–33.0)
MCHC: 32.3 g/dL (ref 31.5–35.7)
MCV: 100 fL — ABNORMAL HIGH (ref 79–97)
Monocytes Absolute: 0.5 10*3/uL (ref 0.1–0.9)
Monocytes: 9 %
Neutrophils Absolute: 2.8 10*3/uL (ref 1.4–7.0)
Neutrophils: 48 %
Platelets: 355 10*3/uL (ref 150–450)
RBC: 3.44 x10E6/uL — ABNORMAL LOW (ref 3.77–5.28)
RDW: 15.1 % (ref 11.7–15.4)
WBC: 5.8 10*3/uL (ref 3.4–10.8)

## 2023-01-15 LAB — VITAMIN D 25 HYDROXY (VIT D DEFICIENCY, FRACTURES): Vit D, 25-Hydroxy: 13.1 ng/mL — ABNORMAL LOW (ref 30.0–100.0)

## 2023-01-15 LAB — CMP14+EGFR
ALT: 13 IU/L (ref 0–32)
AST: 18 IU/L (ref 0–40)
Albumin/Globulin Ratio: 1.7 (ref 1.2–2.2)
Albumin: 4.2 g/dL (ref 3.8–4.9)
Alkaline Phosphatase: 142 IU/L — ABNORMAL HIGH (ref 44–121)
BUN/Creatinine Ratio: 19 (ref 9–23)
BUN: 16 mg/dL (ref 6–24)
Bilirubin Total: 0.5 mg/dL (ref 0.0–1.2)
CO2: 24 mmol/L (ref 20–29)
Calcium: 9.2 mg/dL (ref 8.7–10.2)
Chloride: 102 mmol/L (ref 96–106)
Creatinine, Ser: 0.83 mg/dL (ref 0.57–1.00)
Globulin, Total: 2.5 g/dL (ref 1.5–4.5)
Glucose: 112 mg/dL — ABNORMAL HIGH (ref 70–99)
Potassium: 4.4 mmol/L (ref 3.5–5.2)
Sodium: 140 mmol/L (ref 134–144)
Total Protein: 6.7 g/dL (ref 6.0–8.5)
eGFR: 85 mL/min/{1.73_m2} (ref >59)

## 2023-01-15 LAB — TSH: TSH: 0.972 u[IU]/mL (ref 0.450–4.500)

## 2023-01-15 LAB — MICROALBUMIN / CREATININE URINE RATIO
Creatinine, Urine: 178.4 mg/dL
Microalb/Creat Ratio: 2 mg/g creat (ref 0–29)
Microalbumin, Urine: 3.5 ug/mL

## 2023-01-15 LAB — LIPID PANEL
Chol/HDL Ratio: 2.3 ratio (ref 0.0–4.4)
Cholesterol, Total: 169 mg/dL (ref 100–199)
HDL: 73 mg/dL (ref >39)
LDL Chol Calc (NIH): 79 mg/dL (ref 0–99)
Triglycerides: 94 mg/dL (ref 0–149)
VLDL Cholesterol Cal: 17 mg/dL (ref 5–40)

## 2023-01-15 LAB — HEMOGLOBIN A1C
Est. average glucose Bld gHb Est-mCnc: 137 mg/dL
Hgb A1c MFr Bld: 6.4 % — ABNORMAL HIGH (ref 4.8–5.6)

## 2023-01-18 ENCOUNTER — Other Ambulatory Visit: Payer: Self-pay | Admitting: Family Medicine

## 2023-01-18 DIAGNOSIS — E559 Vitamin D deficiency, unspecified: Secondary | ICD-10-CM

## 2023-01-23 ENCOUNTER — Other Ambulatory Visit: Payer: Self-pay | Admitting: Family Medicine

## 2023-01-23 DIAGNOSIS — E119 Type 2 diabetes mellitus without complications: Secondary | ICD-10-CM

## 2023-01-23 DIAGNOSIS — E559 Vitamin D deficiency, unspecified: Secondary | ICD-10-CM

## 2023-01-23 MED ORDER — METFORMIN HCL 500 MG PO TABS: 500.0000 mg | ORAL_TABLET | Freq: Every day | ORAL | 1 refills | Status: DC

## 2023-01-23 NOTE — Progress Notes (Signed)
Please continue taking your weekly vitamin D supplement because your vitamin D is low.  Hemoglobin A1c 6.4, indicating that you are diabetic.  A prescription for metformin 500 mg to take daily has been sent to your pharmacy.I recommend avoiding simple carbohydrates including cakes, sweet desserts, ice cream, soda (diet or regular), sweet tea, candies, chips, cookies, store-bought juices, alcohol in excess of 1-2 drinks a day, lemonade, artificial sweeteners, donuts, coffee creamers, and sugar-free products.  All other labs are stable.

## 2023-01-25 NOTE — Progress Notes (Signed)
Sabrina Roberts

## 2023-02-21 IMAGING — US US RENAL
1 series · 14 of 25 positions shown · non-contrast
Comparison: None.

CLINICAL DATA: Acute kidney injury.

EXAM:
RENAL / URINARY TRACT ULTRASOUND COMPLETE

[Series 1: us renal · 0.23mm/px · 14 of 61 slices shown]
[im 1/61]
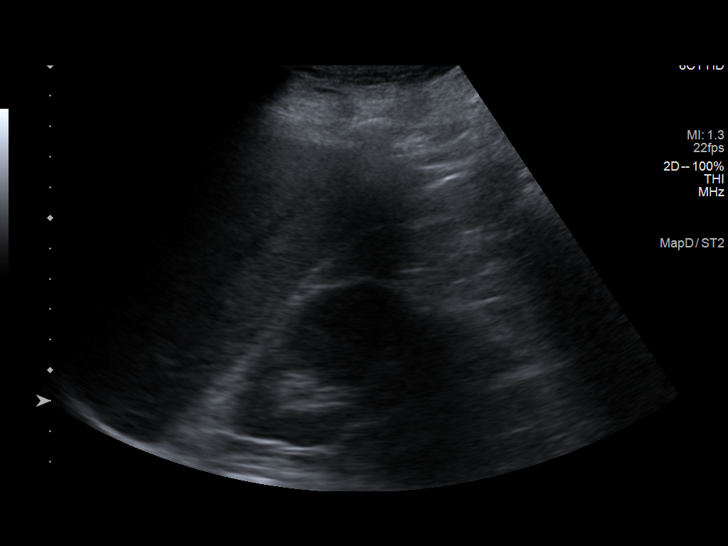
[im 6/61]
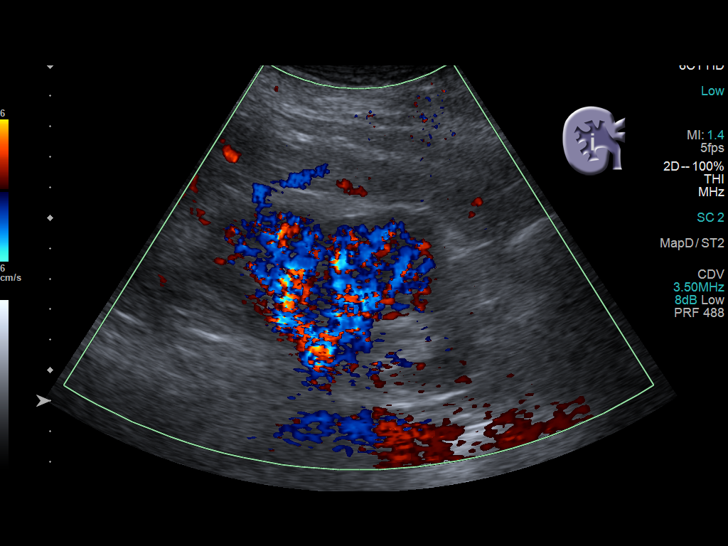
[im 11/61]
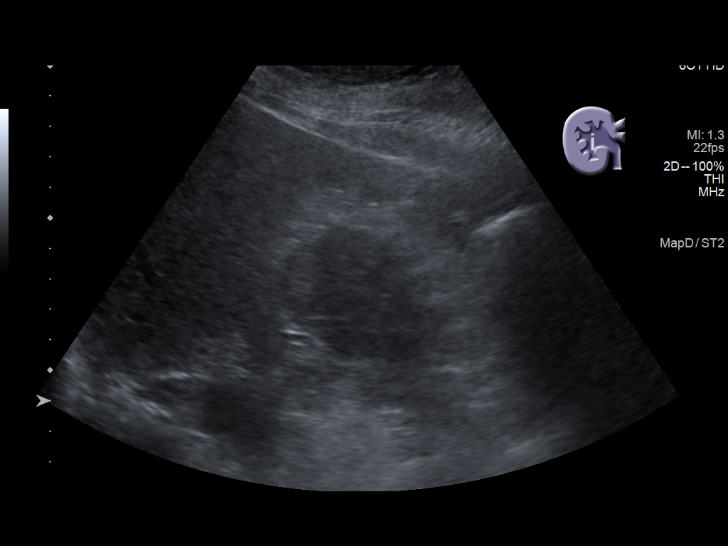
[im 16/61]
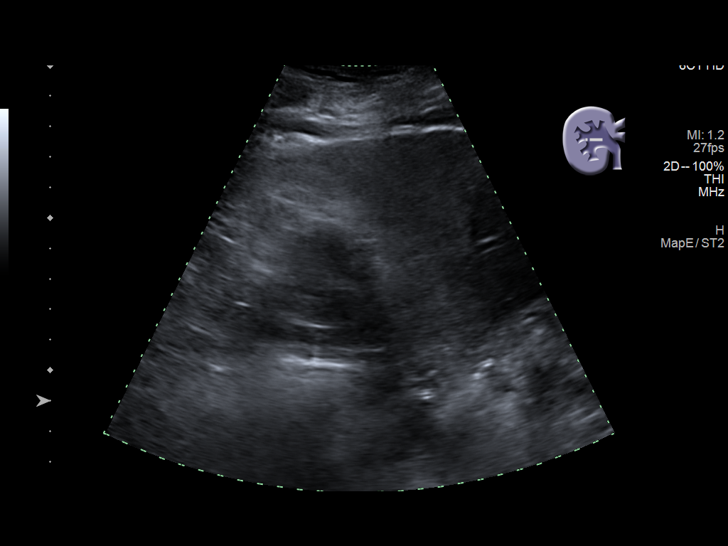
[im 21/61]
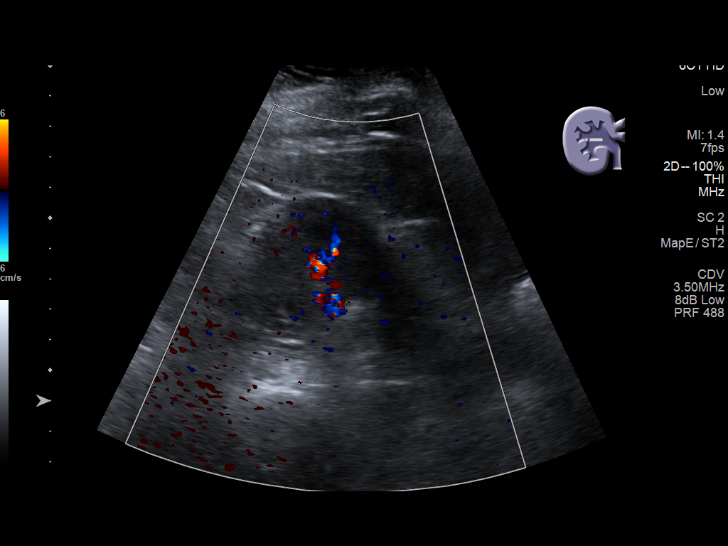
[im 23/61]
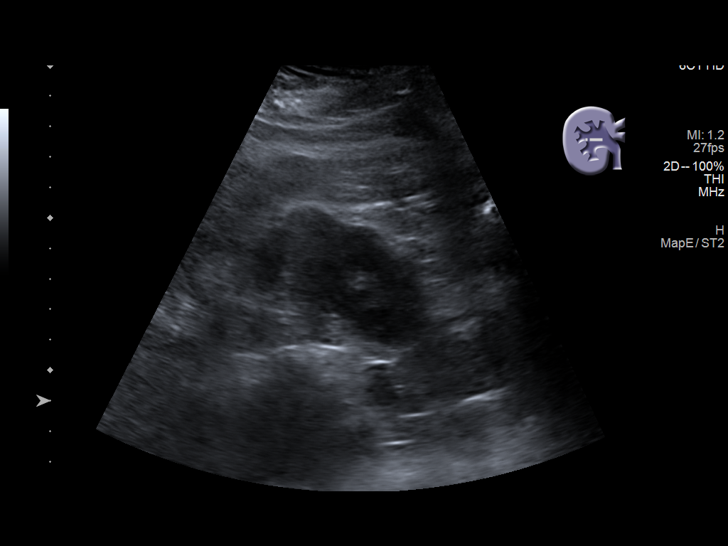
[im 28/61]
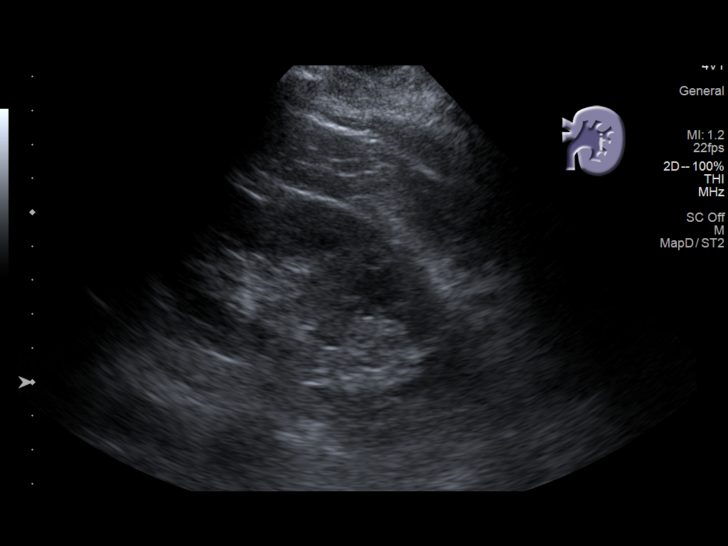
[im 33/61]
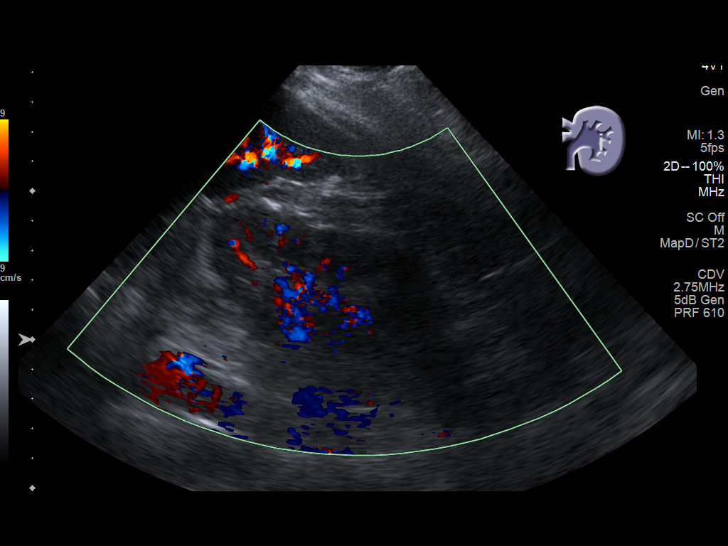
[im 38/61]
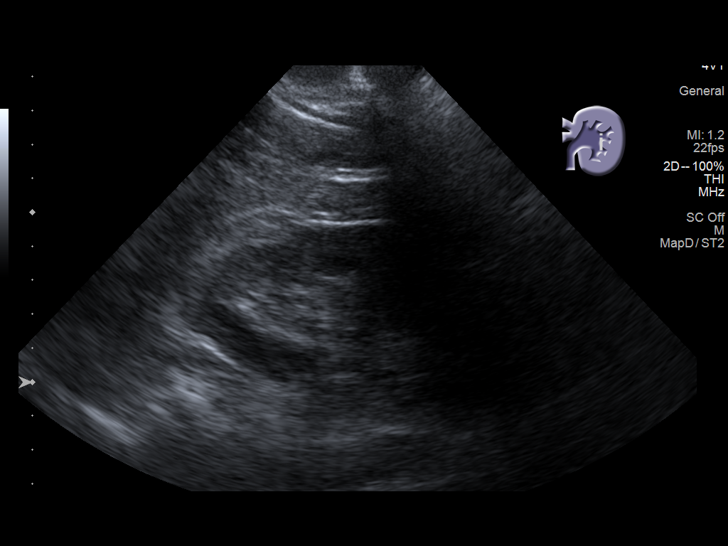
[im 41/61]
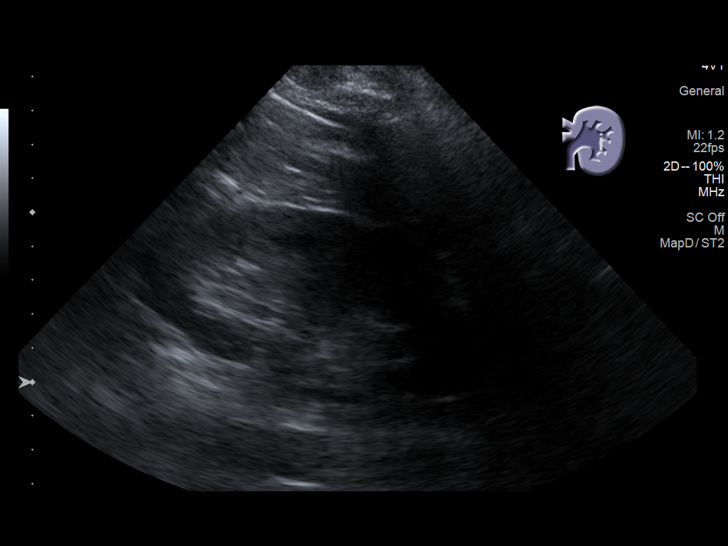
[im 46/61]
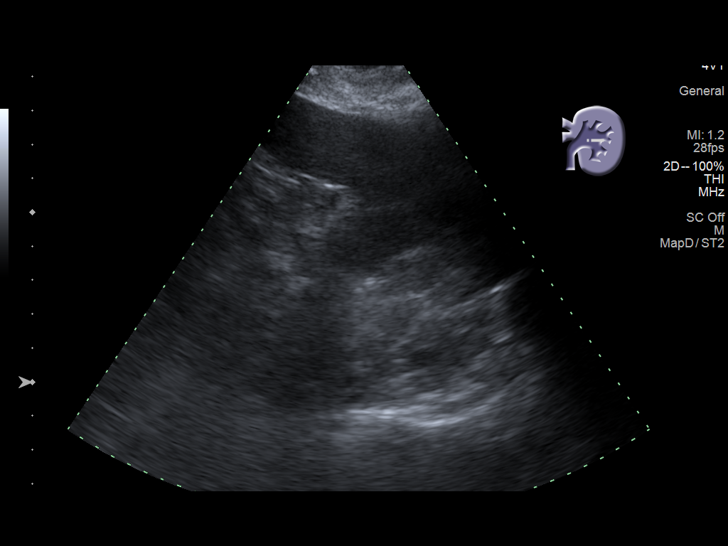
[im 51/61]
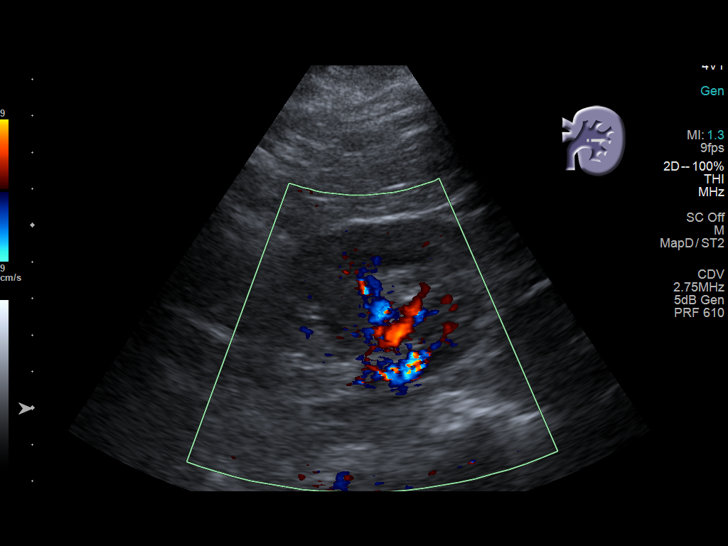
[im 56/61]
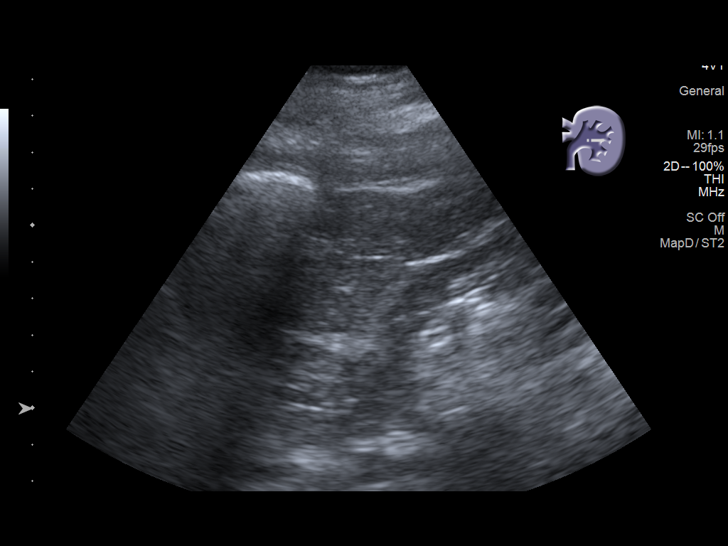
[im 61/61]
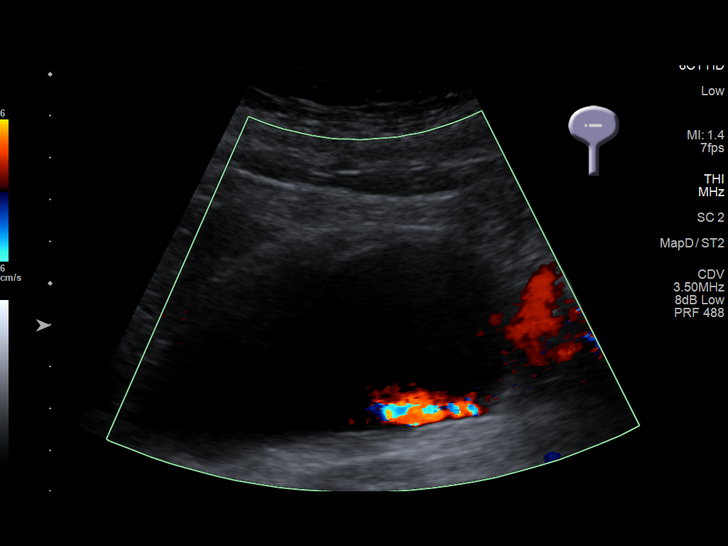

[14 of 25 positions shown; findings below may reference images not displayed]

FINDINGS: Right Kidney:

Renal measurements: 9.9 x 6.1 x 5.3 cm = volume: 165.6 mL.
Echogenicity within normal limits, with fetal lobation. No mass,
calculus or hydronephrosis visualized.

Left Kidney:

Renal measurements: 9.8 x 5.1 x 4.3 cm = volume: 112.7 mL.
Echogenicity within normal limits with fetal lobation. No mass,
calculus or hydronephrosis visualized.

Bladder:

Appears normal for degree of bladder distention. Bilateral ureteral
jets are confirmed present.

Other:

No free fluid is seen in the pelvis or around the kidneys.
IMPRESSION: Negative ultrasound.

## 2023-03-15 ENCOUNTER — Encounter (HOSPITAL_COMMUNITY): Payer: Self-pay

## 2023-03-15 ENCOUNTER — Ambulatory Visit (HOSPITAL_COMMUNITY)
Admission: RE | Admit: 2023-03-15 | Discharge: 2023-03-15 | Disposition: A | Discharge status: Home / Self Care | Payer: 59 | Source: Ambulatory Visit | Attending: Family Medicine | Admitting: Family Medicine

## 2023-03-15 DIAGNOSIS — Z1231 Encounter for screening mammogram for malignant neoplasm of breast: Secondary | ICD-10-CM | POA: Insufficient documentation

## 2023-03-22 ENCOUNTER — Encounter (HOSPITAL_COMMUNITY): Payer: Self-pay | Admitting: Psychiatry

## 2023-03-22 ENCOUNTER — Ambulatory Visit (INDEPENDENT_AMBULATORY_CARE_PROVIDER_SITE_OTHER): Payer: 59 | Admitting: Psychiatry

## 2023-03-22 DIAGNOSIS — Z79899 Other long term (current) drug therapy: Secondary | ICD-10-CM

## 2023-03-22 DIAGNOSIS — F411 Generalized anxiety disorder: Secondary | ICD-10-CM

## 2023-03-22 DIAGNOSIS — G2581 Restless legs syndrome: Secondary | ICD-10-CM

## 2023-03-22 DIAGNOSIS — F5101 Primary insomnia: Secondary | ICD-10-CM

## 2023-03-22 DIAGNOSIS — F1320 Sedative, hypnotic or anxiolytic dependence, uncomplicated: Secondary | ICD-10-CM

## 2023-03-22 DIAGNOSIS — F4310 Post-traumatic stress disorder, unspecified: Secondary | ICD-10-CM

## 2023-03-22 DIAGNOSIS — D519 Vitamin B12 deficiency anemia, unspecified: Secondary | ICD-10-CM | POA: Diagnosis not present

## 2023-03-22 DIAGNOSIS — F332 Major depressive disorder, recurrent severe without psychotic features: Secondary | ICD-10-CM

## 2023-03-22 DIAGNOSIS — F4001 Agoraphobia with panic disorder: Secondary | ICD-10-CM

## 2023-03-22 DIAGNOSIS — F4381 Prolonged grief disorder: Secondary | ICD-10-CM

## 2023-03-22 DIAGNOSIS — E538 Deficiency of other specified B group vitamins: Secondary | ICD-10-CM

## 2023-03-22 DIAGNOSIS — F331 Major depressive disorder, recurrent, moderate: Secondary | ICD-10-CM | POA: Insufficient documentation

## 2023-03-22 DIAGNOSIS — F330 Major depressive disorder, recurrent, mild: Secondary | ICD-10-CM | POA: Insufficient documentation

## 2023-03-22 HISTORY — DX: Sedative, hypnotic or anxiolytic dependence, uncomplicated: F13.20

## 2023-03-22 MED ORDER — DULOXETINE HCL 60 MG PO CPEP: 60.0000 mg | ORAL_CAPSULE | Freq: Every day | ORAL | 0 refills | Status: DC

## 2023-03-22 MED ORDER — QUETIAPINE FUMARATE 50 MG PO TABS: 50.0000 mg | ORAL_TABLET | Freq: Every day | ORAL | 1 refills | Status: DC
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
## 2023-03-22 NOTE — Patient Instructions (Signed)
We increased the Cymbalta (duloxetine) to 90 mg once daily today.  In 2 weeks increase your Seroquel (quetiapine) to be 50 mg dose nightly.  This will allow [place] to track potential side effects.  For your Ambien, is a medication that is best used extremely sparingly so we will plan on your current prescription being the last one.  This may have been accounting for some of the memory difficulty that you have been experiencing due to its long-term use as well as some of the nutritional deficiencies which we talked about.  I will coordinate with your PCP to get a nutrition referral.

## 2023-03-23 ENCOUNTER — Telehealth: Payer: Self-pay

## 2023-03-23 NOTE — Telephone Encounter (Signed)
  What were the alternatives listed on the PA by her insurance?

## 2023-03-23 NOTE — Telephone Encounter (Signed)
Trulicity PA started on 03/22/2023, was denied need alternative.

## 2023-03-31 ENCOUNTER — Other Ambulatory Visit: Payer: Self-pay | Admitting: Family Medicine

## 2023-03-31 DIAGNOSIS — E119 Type 2 diabetes mellitus without complications: Secondary | ICD-10-CM

## 2023-03-31 MED ORDER — METFORMIN HCL 500 MG PO TABS: 500.0000 mg | ORAL_TABLET | Freq: Every day | ORAL | 3 refills | Status: DC

## 2023-03-31 NOTE — Telephone Encounter (Signed)
Lvm letting pt know. 

## 2023-03-31 NOTE — Telephone Encounter (Signed)
No alternatives noted.

## 2023-03-31 NOTE — Telephone Encounter (Signed)
Kindly inform the patient that a prescription for metformin 500 mg daily has been sent to her pharmacy.

## 2023-04-05 ENCOUNTER — Ambulatory Visit (INDEPENDENT_AMBULATORY_CARE_PROVIDER_SITE_OTHER): Payer: Medicaid Other | Admitting: Family Medicine

## 2023-04-05 ENCOUNTER — Encounter: Payer: Self-pay | Admitting: Family Medicine

## 2023-04-05 VITALS — BP 129/84 | HR 92 | Ht 64.0 in | Wt 218.1 lb

## 2023-04-05 DIAGNOSIS — Z23 Encounter for immunization: Secondary | ICD-10-CM | POA: Diagnosis not present

## 2023-04-05 DIAGNOSIS — E119 Type 2 diabetes mellitus without complications: Secondary | ICD-10-CM | POA: Diagnosis not present

## 2023-04-05 DIAGNOSIS — D519 Vitamin B12 deficiency anemia, unspecified: Secondary | ICD-10-CM | POA: Diagnosis not present

## 2023-04-05 DIAGNOSIS — E7849 Other hyperlipidemia: Secondary | ICD-10-CM | POA: Diagnosis not present

## 2023-04-05 DIAGNOSIS — F411 Generalized anxiety disorder: Secondary | ICD-10-CM

## 2023-04-05 MED ORDER — TRULICITY 3 MG/0.5ML ~~LOC~~ SOAJ: 3.0000 mg | SUBCUTANEOUS | 2 refills | Status: DC

## 2023-04-05 MED ORDER — CYANOCOBALAMIN 1000 MCG/ML IJ SOLN
1000.0000 ug | Freq: Once | INTRAMUSCULAR | Status: AC
  Administered 2023-04-05: 1000 ug via INTRAMUSCULAR

## 2023-04-05 NOTE — Assessment & Plan Note (Signed)
She takes Trulicity 3 mg weekly Reports that she has been out of her medication for 3 weeks Denies polyuria, polyphagia, polydipsia Refill sent to the pharmacy Lab Results  Component Value Date   HGBA1C 6.4 (H) 01/13/2023

## 2023-04-05 NOTE — Assessment & Plan Note (Signed)
Denies suicidal thoughts or ideation She takes Cymbalta 90 mg daily GAD-7 is 18 Encouraged to continue following up with her psychiatrist

## 2023-04-05 NOTE — Progress Notes (Signed)
Established Patient Office Visit  Subjective:  Patient ID: Sabrina Roberts, female    DOB: Feb 06, 1971  Age: 52 y.o. MRN: [id number]  CC:  Chief Complaint  Patient presents with   Diabetes    Follow up    HPI Sabrina Roberts is a 52 y.o. female with past medical history of hyperlipidemia, type 2 diabetes and generalized anxiety disorder presents for f/u of  chronic medical conditions. For the details of today's visit, please refer to the assessment and plan.     Past Medical History:  Diagnosis Date   Anemia    Anxiety    Arthritis    Depression    Diabetes mellitus    Hyperlipidemia    Hypertension    Obesity    OSA (obstructive sleep apnea) 05/22/2021   Sleep apnea    Has CPAP but has lost weight and hasnt used    Past Surgical History:  Procedure Laterality Date   CARPAL TUNNEL RELEASE Right 10/01/2016   Procedure: RIGHT CARPAL TUNNEL RELEASE;  Surgeon: Vickki Hearing, MD;  Location: [facility];  Service: Orthopedics;  Laterality: Right;   CARPAL TUNNEL RELEASE Left 10/22/2016   Procedure: CARPAL TUNNEL RELEASE;  Surgeon: Vickki Hearing, MD;  Location: [facility];  Service: Orthopedics;  Laterality: Left;   CESAREAN SECTION  2002   CHOLECYSTECTOMY  dec 2012   GASTRIC BYPASS  dec 2012   KNEE ARTHROSCOPY Right     Family History  Problem Relation Age of Onset   Hypertension Mother    Depression Mother    Diabetes Mother    Irritable bowel syndrome Mother    Heart disease Mother    Stroke Mother    Hypertension Father    Hyperlipidemia Father    Prostate cancer Father    Colon polyps Father    Heart disease Father    Kidney Stones Father    Diverticulitis Father    Colon cancer Father    Breast cancer Paternal Grandmother    Colon cancer Paternal Grandfather    Diabetes Maternal Grandmother    Breast cancer Maternal Aunt    Stomach cancer Paternal Uncle    Colon cancer Other        Paternal Great Uncles x 4   Colon polyps Paternal Aunt        x 3     Social History   Socioeconomic History   Marital status: Married    Spouse name: Not on file   Number of children: Not on file   Years of education: Not on file   Highest education level: Not on file  Occupational History   Not on file  Tobacco Use   Smoking status: Never    Passive exposure: Current   Smokeless tobacco: Never  Vaping Use   Vaping Use: Never used  Substance and Sexual Activity   Alcohol use: Yes    Comment: 1 mixed drink every week or every month   Drug use: No   Sexual activity: Yes    Partners: Male    Birth control/protection: None  Other Topics Concern   Not on file  Social History Narrative   Not on file   Social Determinants of Health   Financial Resource Strain: Not on file  Food Insecurity: Not on file  Transportation Needs: Not on file  Physical Activity: Not on file  Stress: Not on file  Social Connections: Not on file  Intimate Partner Violence: Not on file  Outpatient Medications Prior to Visit  Medication Sig Dispense Refill   atorvastatin (LIPITOR) 10 MG tablet Take 1 tablet (10 mg total) by mouth daily. 90 tablet 1   Blood Glucose Monitoring Suppl (BLOOD GLUCOSE SYSTEM PAK) KIT Please dispense based on patient and insurance preference.  Use as directed to monitor FSBS 1-2 times daily.  Dx E11.9 1 kit 1   diazepam (VALIUM) 5 MG tablet Take 1 tablet (5 mg total) by mouth every 12 (twelve) hours as needed for anxiety. 5 tablet 0   diclofenac (VOLTAREN) 75 MG EC tablet Take 1 tablet (75 mg total) by mouth 2 (two) times daily. 60 tablet 1   DULoxetine (CYMBALTA) 60 MG capsule Take 1 capsule (60 mg total) by mouth daily. (Patient taking differently: Take 120 mg by mouth daily.) 30 capsule 0   ferrous sulfate 325 (65 FE) MG tablet Take 1 tablet (325 mg total) by mouth daily with breakfast. 30 tablet 3   glucose blood test strip Please dispense based on patient and insurance preference.  Use as directed to monitor FSBS 1-2 times daily.   Dx E11.9 100 each 11   HYDROcodone-acetaminophen (NORCO) 5-325 MG tablet Take 1 tablet by mouth every 6 (six) hours as needed for severe pain. 6 tablet 0   Lancets MISC Please dispense based on patient and insurance preference.  Use as directed to monitor FSBS 1-2 times daily.  Dx E11.9 100 each 11   Multiple Vitamin (MULTIVITAMIN) tablet Take 1 tablet by mouth daily.      pregabalin (LYRICA) 300 MG capsule Take 1 capsule (300 mg total) by mouth daily. 90 capsule 1   QUEtiapine (SEROQUEL) 50 MG tablet Take 1 tablet (50 mg total) by mouth at bedtime. 30 tablet 1   rOPINIRole (REQUIP) 0.5 MG tablet Take 0.5-1.5 mg by mouth at bedtime.     Vitamin D, Ergocalciferol, (DRISDOL) 1.25 MG (50000 UNIT) CAPS capsule Take 1 capsule (50,000 Units total) by mouth every 7 (seven) days. 10 capsule 1   zolpidem (AMBIEN) 10 MG tablet Take 10 mg by mouth at bedtime.     Dulaglutide (TRULICITY) 3 MG/0.5ML SOPN Inject 3 mg as directed once a week. 3 mL 2   metFORMIN (GLUCOPHAGE) 500 MG tablet Take 1 tablet (500 mg total) by mouth daily at 2 PM. (Patient not taking: Reported on 04/05/2023) 180 tablet 3   Facility-Administered Medications Prior to Visit  Medication Dose Route Frequency Provider Last Rate Last Admin   cyanocobalamin ((VITAMIN B-12)) injection 1,000 mcg  1,000 mcg Intramuscular Q30 days Milinda Antisurham, [place] F, MD   1,000 mcg at 05/01/21 1630    Allergies  Allergen Reactions   Actos [Pioglitazone Hydrochloride] Anaphylaxis, Hives and Other (See Comments)    Chest pain    Latex Rash    ROS Review of Systems  Constitutional:  Negative for chills and fever.  Eyes:  Negative for visual disturbance.  Respiratory:  Negative for chest tightness and shortness of breath.   Neurological:  Negative for dizziness and headaches.      Objective:    Physical Exam HENT:     Head: Normocephalic.     Mouth/Throat:     Mouth: Mucous membranes are moist.  Cardiovascular:     Rate and Rhythm: Normal rate.      Heart sounds: Normal heart sounds.  Pulmonary:     Effort: Pulmonary effort is normal.     Breath sounds: Normal breath sounds.  Neurological:     Mental Status: She  is alert.     BP 129/84   Pulse 92   Ht 5\' 4"  (1.626 m)   Wt 218 lb 1.9 oz (98.9 kg)   LMP  (LMP Unknown)   SpO2 94%   BMI 37.44 kg/m  Wt Readings from Last 3 Encounters:  04/05/23 218 lb 1.9 oz (98.9 kg)  01/04/23 219 lb 1.9 oz (99.4 kg)  02/17/22 202 lb (91.6 kg)    Lab Results  Component Value Date   TSH 0.972 01/13/2023   Lab Results  Component Value Date   WBC 5.8 01/13/2023   HGB 11.1 01/13/2023   HCT 34.4 01/13/2023   MCV 100 (H) 01/13/2023   PLT 355 01/13/2023   Lab Results  Component Value Date   NA 140 01/13/2023   K 4.4 01/13/2023   CO2 24 01/13/2023   GLUCOSE 112 (H) 01/13/2023   BUN 16 01/13/2023   CREATININE 0.83 01/13/2023   BILITOT 0.5 01/13/2023   ALKPHOS 142 (H) 01/13/2023   AST 18 01/13/2023   ALT 13 01/13/2023   PROT 6.7 01/13/2023   ALBUMIN 4.2 01/13/2023   CALCIUM 9.2 01/13/2023   ANIONGAP 8 02/14/2022   EGFR 85 01/13/2023   Lab Results  Component Value Date   CHOL 169 01/13/2023   Lab Results  Component Value Date   HDL 73 01/13/2023   Lab Results  Component Value Date   LDLCALC 79 01/13/2023   Lab Results  Component Value Date   TRIG 94 01/13/2023   Lab Results  Component Value Date   CHOLHDL 2.3 01/13/2023   Lab Results  Component Value Date   HGBA1C 6.4 (H) 01/13/2023      Assessment & Plan:  Type 2 diabetes mellitus without complication, without long-term current use of insulin Assessment & Plan: She takes Trulicity 3 mg weekly Reports that she has been out of her medication for 3 weeks Denies polyuria, polyphagia, polydipsia Refill sent to the pharmacy Lab Results  Component Value Date   HGBA1C 6.4 (H) 01/13/2023     Orders: -     Trulicity; Inject 3 mg as directed once a week.  Dispense: 3 mL; Refill: 2  Other  hyperlipidemia Assessment & Plan: She takes atorvastatin 10 mg daily No muscle aches or pain reported Lab Results  Component Value Date   CHOL 169 01/13/2023   HDL 73 01/13/2023   LDLCALC 79 01/13/2023   TRIG 94 01/13/2023   CHOLHDL 2.3 01/13/2023      GAD (generalized anxiety disorder) Assessment & Plan: Denies suicidal thoughts or ideation She takes Cymbalta 90 mg daily GAD-7 is 18 Encouraged to continue following up with her psychiatrist   Anemia due to vitamin B12 deficiency, unspecified B12 deficiency type -     Cyanocobalamin  Other orders -     Tdap vaccine greater than or equal to 7yo IM -     Varicella-zoster vaccine IM    Follow-up: Return in about 4 months (around 08/05/2023).   Gilmore Laroche, FNP

## 2023-04-05 NOTE — Patient Instructions (Signed)
I appreciate the opportunity to provide care to you today!    Follow up:  4 months  Labs:next visit     Please pick up your medication at the pharmacy   Please continue to a heart-healthy diet and increase your physical activities. Try to exercise for at least five days a week.      It was a pleasure to see you and I look forward to continuing to work together on your health and well-being. Please do not hesitate to call the office if you need care or have questions about your care.   Have a wonderful day and week. With Gratitude, Gilmore Laroche MSN, FNP-BC

## 2023-04-05 NOTE — Assessment & Plan Note (Signed)
She takes atorvastatin 10 mg daily No muscle aches or pain reported Lab Results  Component Value Date   CHOL 169 01/13/2023   HDL 73 01/13/2023   LDLCALC 79 01/13/2023   TRIG 94 01/13/2023   CHOLHDL 2.3 01/13/2023

## 2023-05-04 ENCOUNTER — Encounter (HOSPITAL_BASED_OUTPATIENT_CLINIC_OR_DEPARTMENT_OTHER): Payer: Self-pay | Admitting: Podiatry

## 2023-05-04 NOTE — Progress Notes (Addendum)
Spoke w/ via phone for pre-op interview--- Sabrina Roberts needs dos---- CBG              Roberts results------ COVID test -----patient states asymptomatic no test needed Arrive at -------0900 NPO after MN NO Solid Food.  Clear liquids from MN until---0800 Med rec completed Medications to take morning of surgery -----Cymbalta and Lyrica Diabetic medication ----- NONE AM of surgery Patient instructed no nail polish to be worn day of surgery Patient instructed to bring photo id and insurance card day of surgery Patient aware to have Driver (ride ) / caregiver  Daughter Rheanne Maggiore  for 24 hours after surgery  Patient Special Instructions ----- Patient takes Trulicity, last dose 05/03/23. Patient verbalized understanding to not take Trulicity dose until after procedure. Pre-Op special Instructions ----- Patient verbalized understanding of instructions that were given at this phone interview. Patient denies shortness of breath, chest pain, fever, cough at this phone interview.   Awaiting H&P. Patient to see primary care next week.

## 2023-05-07 ENCOUNTER — Other Ambulatory Visit (HOSPITAL_COMMUNITY): Payer: Self-pay | Admitting: Psychiatry

## 2023-05-07 DIAGNOSIS — F5101 Primary insomnia: Secondary | ICD-10-CM

## 2023-05-11 ENCOUNTER — Ambulatory Visit: Payer: Medicaid Other | Admitting: Family Medicine

## 2023-05-12 ENCOUNTER — Ambulatory Visit (HOSPITAL_COMMUNITY)
Admission: RE | Admit: 2023-05-12 | Discharge: 2023-05-12 | Disposition: A | Discharge status: Home / Self Care | Payer: Medicaid Other | Source: Ambulatory Visit | Attending: Family Medicine | Admitting: Family Medicine

## 2023-05-12 ENCOUNTER — Ambulatory Visit (INDEPENDENT_AMBULATORY_CARE_PROVIDER_SITE_OTHER): Payer: Medicaid Other | Admitting: Family Medicine

## 2023-05-12 ENCOUNTER — Other Ambulatory Visit: Payer: Self-pay

## 2023-05-12 ENCOUNTER — Telehealth: Payer: Self-pay | Admitting: Family Medicine

## 2023-05-12 ENCOUNTER — Encounter: Payer: Self-pay | Admitting: Family Medicine

## 2023-05-12 VITALS — BP 131/88 | HR 61 | Ht 64.0 in | Wt 219.0 lb

## 2023-05-12 DIAGNOSIS — E782 Mixed hyperlipidemia: Secondary | ICD-10-CM | POA: Diagnosis not present

## 2023-05-12 DIAGNOSIS — Z01818 Encounter for other preprocedural examination: Secondary | ICD-10-CM | POA: Insufficient documentation

## 2023-05-12 DIAGNOSIS — Z1211 Encounter for screening for malignant neoplasm of colon: Secondary | ICD-10-CM

## 2023-05-12 DIAGNOSIS — R7301 Impaired fasting glucose: Secondary | ICD-10-CM

## 2023-05-12 LAB — POCT URINALYSIS DIPSTICK
Bilirubin, UA: NEGATIVE
Blood, UA: NEGATIVE
Clarity, UA: NEGATIVE
Glucose, UA: NEGATIVE
Ketones, UA: NEGATIVE
Leukocytes, UA: NEGATIVE
Nitrite, UA: NEGATIVE
Protein, UA: NEGATIVE
Spec Grav, UA: >=1.03 — AB (ref 1.010–1.025)
Urobilinogen, UA: 0.2 E.U./dL
pH, UA: 6 (ref 5.0–8.0)

## 2023-05-12 NOTE — Telephone Encounter (Signed)
Just checked pt out at the front desk, and pt asked about the referral to Gastroenterology. The past referral was denied. Needs another referral. Pt has medicaid now.

## 2023-05-12 NOTE — Progress Notes (Signed)
Established Patient Office Visit  Subjective:  Patient ID: Sabrina Roberts, female    DOB: 06/04/1971  Age: 52 y.o. MRN: [id number]  CC:  Chief Complaint  Patient presents with   Pre-op Exam    Pt forgot form to be completed will have it faxed, surgery is for her feet.     HPI Sabrina Roberts is a 52 y.o. female with past medical history of type 2 diabetes, Vitamin D deficiency, hyperlipidemia presents for surgical clearance. The patient is having a left metatarsal osteotomy  on 05/14/2023. C/o pain with ambulation at the affected site.sensation is intact with normal ROM.   Past Medical History:  Diagnosis Date   Anemia    Anxiety    Arthritis    Depression    Diabetes mellitus    Hyperlipidemia    Hypertension    Obesity    OSA (obstructive sleep apnea) 05/22/2021   Sleep apnea    Has CPAP but has lost weight and hasnt used    Past Surgical History:  Procedure Laterality Date   CARPAL TUNNEL RELEASE Right 10/01/2016   Procedure: RIGHT CARPAL TUNNEL RELEASE;  Surgeon: Vickki Hearing, MD;  Location: [facility];  Service: Orthopedics;  Laterality: Right;   CARPAL TUNNEL RELEASE Left 10/22/2016   Procedure: CARPAL TUNNEL RELEASE;  Surgeon: Vickki Hearing, MD;  Location: [facility];  Service: Orthopedics;  Laterality: Left;   CESAREAN SECTION  2002   CHOLECYSTECTOMY  dec 2012   GASTRIC BYPASS  dec 2012   KNEE ARTHROSCOPY Right     Family History  Problem Relation Age of Onset   Hypertension Mother    Depression Mother    Diabetes Mother    Irritable bowel syndrome Mother    Heart disease Mother    Stroke Mother    Hypertension Father    Hyperlipidemia Father    Prostate cancer Father    Colon polyps Father    Heart disease Father    Kidney Stones Father    Diverticulitis Father    Colon cancer Father    Breast cancer Paternal Grandmother    Colon cancer Paternal Grandfather    Diabetes Maternal Grandmother    Breast cancer Maternal Aunt    Stomach cancer  Paternal Uncle    Colon cancer Other        Paternal Great Uncles x 4   Colon polyps Paternal Aunt        x 3    Social History   Socioeconomic History   Marital status: Married    Spouse name: Not on file   Number of children: Not on file   Years of education: Not on file   Highest education level: Not on file  Occupational History   Not on file  Tobacco Use   Smoking status: Never    Passive exposure: Current   Smokeless tobacco: Never  Vaping Use   Vaping Use: Never used  Substance and Sexual Activity   Alcohol use: Yes    Comment: 1 mixed drink every week or every month   Drug use: No   Sexual activity: Yes    Partners: Male    Birth control/protection: None  Other Topics Concern   Not on file  Social History Narrative   Not on file   Social Determinants of Health   Financial Resource Strain: Not on file  Food Insecurity: Not on file  Transportation Needs: Not on file  Physical Activity: Not on file  Stress:  Not on file  Social Connections: Not on file  Intimate Partner Violence: Not on file    Outpatient Medications Prior to Visit  Medication Sig Dispense Refill   atorvastatin (LIPITOR) 10 MG tablet Take 1 tablet (10 mg total) by mouth daily. 90 tablet 1   Blood Glucose Monitoring Suppl (BLOOD GLUCOSE SYSTEM PAK) KIT Please dispense based on patient and insurance preference.  Use as directed to monitor FSBS 1-2 times daily.  Dx E11.9 1 kit 1   diazepam (VALIUM) 5 MG tablet Take 1 tablet (5 mg total) by mouth every 12 (twelve) hours as needed for anxiety. 5 tablet 0   diclofenac (VOLTAREN) 75 MG EC tablet Take 1 tablet (75 mg total) by mouth 2 (two) times daily. 60 tablet 1   Dulaglutide (TRULICITY) 3 MG/0.5ML SOPN Inject 3 mg as directed once a week. 3 mL 2   DULoxetine (CYMBALTA) 60 MG capsule Take 1 capsule (60 mg total) by mouth daily. (Patient taking differently: Take 120 mg by mouth daily.) 30 capsule 0   ferrous sulfate 325 (65 FE) MG tablet Take 1  tablet (325 mg total) by mouth daily with breakfast. 30 tablet 3   glucose blood test strip Please dispense based on patient and insurance preference.  Use as directed to monitor FSBS 1-2 times daily.  Dx E11.9 100 each 11   HYDROcodone-acetaminophen (NORCO) 5-325 MG tablet Take 1 tablet by mouth every 6 (six) hours as needed for severe pain. 6 tablet 0   Lancets MISC Please dispense based on patient and insurance preference.  Use as directed to monitor FSBS 1-2 times daily.  Dx E11.9 100 each 11   metFORMIN (GLUCOPHAGE) 500 MG tablet Take 1 tablet (500 mg total) by mouth daily at 2 PM. 180 tablet 3   Multiple Vitamin (MULTIVITAMIN) tablet Take 1 tablet by mouth daily.      pregabalin (LYRICA) 300 MG capsule Take 1 capsule (300 mg total) by mouth daily. 90 capsule 1   QUEtiapine (SEROQUEL) 50 MG tablet Take 1 tablet (50 mg total) by mouth at bedtime. 30 tablet 1   rOPINIRole (REQUIP) 0.5 MG tablet Take 0.5-1.5 mg by mouth at bedtime.     Vitamin D, Ergocalciferol, (DRISDOL) 1.25 MG (50000 UNIT) CAPS capsule Take 1 capsule (50,000 Units total) by mouth every 7 (seven) days. 10 capsule 1   zolpidem (AMBIEN) 10 MG tablet Take 10 mg by mouth at bedtime.     Facility-Administered Medications Prior to Visit  Medication Dose Route Frequency Provider Last Rate Last Admin   cyanocobalamin ((VITAMIN B-12)) injection 1,000 mcg  1,000 mcg Intramuscular Q30 days Milinda Antis F, MD   1,000 mcg at 05/01/21 1630    Allergies  Allergen Reactions   Actos [Pioglitazone Hydrochloride] Anaphylaxis, Hives and Other (See Comments)    Chest pain    Latex Rash    ROS Review of Systems  Constitutional:  Negative for chills and fever.  Eyes:  Negative for visual disturbance.  Respiratory:  Negative for chest tightness and shortness of breath.   Neurological:  Negative for dizziness and headaches.      Objective:    Physical Exam HENT:     Head: Normocephalic.     Mouth/Throat:     Mouth: Mucous  membranes are moist.  Cardiovascular:     Rate and Rhythm: Normal rate.     Heart sounds: Normal heart sounds.  Pulmonary:     Effort: Pulmonary effort is normal.     Breath  sounds: Normal breath sounds.  Musculoskeletal:     Left foot: Normal range of motion. Tenderness present. No swelling or foot drop.     Comments: Bunionettes of the fifth metatarsal bilaterally  Neurological:     Mental Status: She is alert.     BP 131/88   Pulse 61   Ht 5\' 4"  (1.626 m)   Wt 219 lb 0.6 oz (99.4 kg)   LMP  (LMP Unknown)   SpO2 97%   BMI 37.60 kg/m  Wt Readings from Last 3 Encounters:  05/12/23 219 lb 0.6 oz (99.4 kg)  04/05/23 218 lb 1.9 oz (98.9 kg)  01/04/23 219 lb 1.9 oz (99.4 kg)    Lab Results  Component Value Date   TSH 0.972 01/13/2023   Lab Results  Component Value Date   WBC 5.8 01/13/2023   HGB 11.1 01/13/2023   HCT 34.4 01/13/2023   MCV 100 (H) 01/13/2023   PLT 355 01/13/2023   Lab Results  Component Value Date   NA 140 01/13/2023   K 4.4 01/13/2023   CO2 24 01/13/2023   GLUCOSE 112 (H) 01/13/2023   BUN 16 01/13/2023   CREATININE 0.83 01/13/2023   BILITOT 0.5 01/13/2023   ALKPHOS 142 (H) 01/13/2023   AST 18 01/13/2023   ALT 13 01/13/2023   PROT 6.7 01/13/2023   ALBUMIN 4.2 01/13/2023   CALCIUM 9.2 01/13/2023   ANIONGAP 8 02/14/2022   EGFR 85 01/13/2023   Lab Results  Component Value Date   CHOL 169 01/13/2023   Lab Results  Component Value Date   HDL 73 01/13/2023   Lab Results  Component Value Date   LDLCALC 79 01/13/2023   Lab Results  Component Value Date   TRIG 94 01/13/2023   Lab Results  Component Value Date   CHOLHDL 2.3 01/13/2023   Lab Results  Component Value Date   HGBA1C 6.4 (H) 01/13/2023      Assessment & Plan:  Preop examination Assessment & Plan: EKG shows sinus bradycardia The patient is a symptomatic in the clinic Pending chest x-ray and BMP and CBC Encouraged the patient to fax surgical clearance form as soon  as possible Will medically optimize the patient with moderate risks for surgery  Orders: -     DG Chest 2 View -     POCT urinalysis dipstick -     EKG 12-Lead  Mixed hyperlipidemia -     CBC with Differential/Platelet -     BMP8+EGFR  IFG (impaired fasting glucose) -     Hemoglobin A1c    Follow-up: Return in about 3 months (around 08/06/2023).   Gilmore Laroche, FNP

## 2023-05-12 NOTE — Assessment & Plan Note (Addendum)
EKG shows sinus bradycardia The patient is a symptomatic in the clinic Pending chest x-ray and BMP and CBC UA negative for leukocytes and nitrates Encouraged the patient to fax surgical clearance form as soon as possible Will medically optimize the patient with moderate risks for surgery

## 2023-05-12 NOTE — Patient Instructions (Addendum)
I appreciate the opportunity to provide care to you today!    Follow up:  08/06/2023  Labs: please stop by the lab today to get your blood drawn (CBC, CMP, and HgA1c)   Please stop by [facility] hospital anytime to get an x-ray of your chest  Kindly fax me the forms needed to clear you for surgery     Please continue to a heart-healthy diet and increase your physical activities. Try to exercise for at least five days a week.      It was a pleasure to see you and I look forward to continuing to work together on your health and well-being. Please do not hesitate to call the office if you need care or have questions about your care.   Have a wonderful day and week. With Gratitude, Gilmore Laroche MSN, FNP-BC

## 2023-05-12 NOTE — Telephone Encounter (Signed)
Referral has been sent.

## 2023-05-13 ENCOUNTER — Encounter (INDEPENDENT_AMBULATORY_CARE_PROVIDER_SITE_OTHER): Payer: Self-pay | Admitting: *Deleted

## 2023-05-13 ENCOUNTER — Telehealth: Payer: Self-pay | Admitting: Family Medicine

## 2023-05-13 LAB — HEMOGLOBIN A1C
Est. average glucose Bld gHb Est-mCnc: 126 mg/dL
Hgb A1c MFr Bld: 6 % — ABNORMAL HIGH (ref 4.8–5.6)

## 2023-05-13 LAB — CBC WITH DIFFERENTIAL/PLATELET
Basophils Absolute: 0 10*3/uL (ref 0.0–0.2)
Basos: 1 %
EOS (ABSOLUTE): 0.1 10*3/uL (ref 0.0–0.4)
Eos: 2 %
Hematocrit: 35.6 % (ref 34.0–46.6)
Hemoglobin: 11.6 g/dL (ref 11.1–15.9)
Immature Grans (Abs): 0 10*3/uL (ref 0.0–0.1)
Immature Granulocytes: 0 %
Lymphocytes Absolute: 2.5 10*3/uL (ref 0.7–3.1)
Lymphs: 39 %
MCH: 32.1 pg (ref 26.6–33.0)
MCHC: 32.6 g/dL (ref 31.5–35.7)
MCV: 99 fL — ABNORMAL HIGH (ref 79–97)
Monocytes Absolute: 0.6 10*3/uL (ref 0.1–0.9)
Monocytes: 10 %
Neutrophils Absolute: 3.2 10*3/uL (ref 1.4–7.0)
Neutrophils: 48 %
Platelets: 308 10*3/uL (ref 150–450)
RBC: 3.61 x10E6/uL — ABNORMAL LOW (ref 3.77–5.28)
RDW: 13.1 % (ref 11.7–15.4)
WBC: 6.5 10*3/uL (ref 3.4–10.8)

## 2023-05-13 LAB — BMP8+EGFR
BUN/Creatinine Ratio: 18 (ref 9–23)
BUN: 18 mg/dL (ref 6–24)
CO2: 18 mmol/L — ABNORMAL LOW (ref 20–29)
Calcium: 9.1 mg/dL (ref 8.7–10.2)
Chloride: 111 mmol/L — ABNORMAL HIGH (ref 96–106)
Creatinine, Ser: 0.99 mg/dL (ref 0.57–1.00)
Glucose: 107 mg/dL — ABNORMAL HIGH (ref 70–99)
Potassium: 4.9 mmol/L (ref 3.5–5.2)
Sodium: 141 mmol/L (ref 134–144)
eGFR: 69 mL/min/{1.73_m2} (ref >59)

## 2023-05-13 NOTE — Progress Notes (Addendum)
H and p dated 05-13-2023 Sabrina zarwolo fnp on chart for 05-14-2023 surgery

## 2023-05-13 NOTE — Progress Notes (Signed)
Your CO2 is slightly low and chloride levels are slightly elevated and not concerning.  Will continue to monitor.  Your hemoglobin A1c has decreased from 6.4 to 6.0.  Please continue taking your medication as prescribed

## 2023-05-13 NOTE — Telephone Encounter (Signed)
Surg clearance   Noted  Copied Sleeved   Original given to cma Copy front desk

## 2023-05-14 ENCOUNTER — Ambulatory Visit (HOSPITAL_BASED_OUTPATIENT_CLINIC_OR_DEPARTMENT_OTHER)
Admission: RE | Admit: 2023-05-14 | Discharge: 2023-05-14 | Disposition: A | Discharge status: Home / Self Care | Payer: Medicaid Other | Attending: Podiatry | Admitting: Podiatry

## 2023-05-14 ENCOUNTER — Other Ambulatory Visit: Payer: Self-pay

## 2023-05-14 ENCOUNTER — Encounter (HOSPITAL_BASED_OUTPATIENT_CLINIC_OR_DEPARTMENT_OTHER): Payer: Self-pay | Admitting: Podiatry

## 2023-05-14 ENCOUNTER — Ambulatory Visit (HOSPITAL_BASED_OUTPATIENT_CLINIC_OR_DEPARTMENT_OTHER): Payer: Medicaid Other | Admitting: Anesthesiology

## 2023-05-14 ENCOUNTER — Ambulatory Visit (HOSPITAL_BASED_OUTPATIENT_CLINIC_OR_DEPARTMENT_OTHER): Payer: Medicaid Other

## 2023-05-14 ENCOUNTER — Encounter (HOSPITAL_BASED_OUTPATIENT_CLINIC_OR_DEPARTMENT_OTHER)
Admission: RE | Disposition: A | Discharge status: Home / Self Care | Payer: Self-pay | Source: Home / Self Care | Attending: Podiatry

## 2023-05-14 DIAGNOSIS — E785 Hyperlipidemia, unspecified: Secondary | ICD-10-CM | POA: Insufficient documentation

## 2023-05-14 DIAGNOSIS — E669 Obesity, unspecified: Secondary | ICD-10-CM | POA: Diagnosis not present

## 2023-05-14 DIAGNOSIS — G4733 Obstructive sleep apnea (adult) (pediatric): Secondary | ICD-10-CM | POA: Insufficient documentation

## 2023-05-14 DIAGNOSIS — I10 Essential (primary) hypertension: Secondary | ICD-10-CM | POA: Insufficient documentation

## 2023-05-14 DIAGNOSIS — Z6837 Body mass index (BMI) 37.0-37.9, adult: Secondary | ICD-10-CM | POA: Diagnosis not present

## 2023-05-14 DIAGNOSIS — M21622 Bunionette of left foot: Secondary | ICD-10-CM

## 2023-05-14 DIAGNOSIS — M1990 Unspecified osteoarthritis, unspecified site: Secondary | ICD-10-CM | POA: Insufficient documentation

## 2023-05-14 DIAGNOSIS — G4730 Sleep apnea, unspecified: Secondary | ICD-10-CM | POA: Diagnosis not present

## 2023-05-14 DIAGNOSIS — F419 Anxiety disorder, unspecified: Secondary | ICD-10-CM | POA: Insufficient documentation

## 2023-05-14 DIAGNOSIS — E119 Type 2 diabetes mellitus without complications: Secondary | ICD-10-CM | POA: Diagnosis not present

## 2023-05-14 DIAGNOSIS — F32A Depression, unspecified: Secondary | ICD-10-CM | POA: Insufficient documentation

## 2023-05-14 HISTORY — PX: METATARSAL OSTEOTOMY: SHX1641

## 2023-05-14 LAB — GLUCOSE, CAPILLARY
Glucose-Capillary: 100 mg/dL — ABNORMAL HIGH (ref 70–99)
Glucose-Capillary: 57 mg/dL — ABNORMAL LOW (ref 70–99)
Glucose-Capillary: 123 mg/dL — ABNORMAL HIGH (ref 70–99)

## 2023-05-14 SURGERY — OSTEOTOMY, METATARSAL BONE
Anesthesia: Monitor Anesthesia Care | Site: Toe | Laterality: Left

## 2023-05-14 MED ORDER — FENTANYL CITRATE (PF) 100 MCG/2ML IJ SOLN: 25.0000 ug | INTRAMUSCULAR | Status: DC | PRN

## 2023-05-14 MED ORDER — BUPIVACAINE HCL (PF) 0.5 % IJ SOLN
INTRAMUSCULAR | Status: DC | PRN
  Administered 2023-05-14: 30 mL via PERINEURAL

## 2023-05-14 MED ORDER — CEFAZOLIN SODIUM-DEXTROSE 2-4 GM/100ML-% IV SOLN
INTRAVENOUS | Status: AC
  Filled 2023-05-14: qty 100

## 2023-05-14 MED ORDER — CEFAZOLIN SODIUM-DEXTROSE 2-4 GM/100ML-% IV SOLN
2.0000 g | INTRAVENOUS | Status: AC
  Administered 2023-05-14: 2 g via INTRAVENOUS

## 2023-05-14 MED ORDER — PROPOFOL 1000 MG/100ML IV EMUL
INTRAVENOUS | Status: AC
  Filled 2023-05-14: qty 100

## 2023-05-14 MED ORDER — 0.9 % SODIUM CHLORIDE (POUR BTL) OPTIME
TOPICAL | Status: DC | PRN
  Administered 2023-05-14: 500 mL

## 2023-05-14 MED ORDER — ONDANSETRON HCL 4 MG/2ML IJ SOLN: 4.0000 mg | Freq: Four times a day (QID) | INTRAMUSCULAR | Status: DC | PRN

## 2023-05-14 MED ORDER — DEXAMETHASONE SODIUM PHOSPHATE 10 MG/ML IJ SOLN
INTRAMUSCULAR | Status: AC
  Filled 2023-05-14: qty 1

## 2023-05-14 MED ORDER — OXYCODONE HCL 5 MG/5ML PO SOLN: 5.0000 mg | Freq: Once | ORAL | Status: DC | PRN

## 2023-05-14 MED ORDER — FENTANYL CITRATE (PF) 100 MCG/2ML IJ SOLN
50.0000 ug | Freq: Once | INTRAMUSCULAR | Status: AC
  Administered 2023-05-14: 50 ug via INTRAVENOUS

## 2023-05-14 MED ORDER — FENTANYL CITRATE (PF) 100 MCG/2ML IJ SOLN
INTRAMUSCULAR | Status: AC
  Filled 2023-05-14: qty 2

## 2023-05-14 MED ORDER — ONDANSETRON HCL 4 MG/2ML IJ SOLN
INTRAMUSCULAR | Status: DC | PRN
  Administered 2023-05-14: 4 mg via INTRAVENOUS

## 2023-05-14 MED ORDER — PROPOFOL 10 MG/ML IV BOLUS
INTRAVENOUS | Status: DC | PRN
  Administered 2023-05-14 (×2): 20 mg via INTRAVENOUS

## 2023-05-14 MED ORDER — CHLORHEXIDINE GLUCONATE CLOTH 2 % EX PADS: 6.0000 | MEDICATED_PAD | Freq: Once | CUTANEOUS | Status: DC

## 2023-05-14 MED ORDER — LIDOCAINE HCL (PF) 2 % IJ SOLN
INTRAMUSCULAR | Status: AC
  Filled 2023-05-14: qty 5

## 2023-05-14 MED ORDER — OXYCODONE HCL 5 MG PO TABS: 5.0000 mg | ORAL_TABLET | Freq: Once | ORAL | Status: DC | PRN

## 2023-05-14 MED ORDER — MIDAZOLAM HCL 2 MG/2ML IJ SOLN
INTRAMUSCULAR | Status: AC
  Filled 2023-05-14: qty 2

## 2023-05-14 MED ORDER — MIDAZOLAM HCL 2 MG/2ML IJ SOLN
1.0000 mg | Freq: Once | INTRAMUSCULAR | Status: AC
  Administered 2023-05-14: 1 mg via INTRAVENOUS

## 2023-05-14 MED ORDER — ONDANSETRON HCL 4 MG/2ML IJ SOLN
INTRAMUSCULAR | Status: AC
  Filled 2023-05-14: qty 2

## 2023-05-14 MED ORDER — LIDOCAINE 2% (20 MG/ML) 5 ML SYRINGE
INTRAMUSCULAR | Status: DC | PRN
  Administered 2023-05-14: 60 mg via INTRAVENOUS

## 2023-05-14 MED ORDER — PROPOFOL 500 MG/50ML IV EMUL
INTRAVENOUS | Status: DC | PRN
  Administered 2023-05-14: 100 ug/kg/min via INTRAVENOUS

## 2023-05-14 MED ORDER — DEXAMETHASONE SODIUM PHOSPHATE 10 MG/ML IJ SOLN
INTRAMUSCULAR | Status: DC | PRN
  Administered 2023-05-14: 10 mg via INTRAVENOUS

## 2023-05-14 MED ORDER — LACTATED RINGERS IV SOLN
INTRAVENOUS | Status: DC
  Administered 2023-05-14: 1000 mL via INTRAVENOUS

## 2023-05-14 SURGICAL SUPPLY — 69 items
APL PRP STRL LF DISP 70% ISPRP (MISCELLANEOUS) ×1
BLADE AVERAGE 25X9 (BLADE) IMPLANT
BLADE MINI RND TIP GREEN BEAV (BLADE) IMPLANT
BLADE OSC/SAG .038X5.5 CUT EDG (BLADE) IMPLANT
BLADE OSCILLATING/SAGITTAL (BLADE) ×1
BLADE SURG 15 STRL LF DISP TIS (BLADE) ×1 IMPLANT
BLADE SURG 15 STRL SS (BLADE) ×1
BLADE SW THK.38XMED LNG THN (BLADE) ×1 IMPLANT
BNDG CMPR 5X3 KNIT ELC UNQ LF (GAUZE/BANDAGES/DRESSINGS) ×1
BNDG CMPR 5X4 KNIT ELC UNQ LF (GAUZE/BANDAGES/DRESSINGS) ×1
BNDG CMPR 75X21 PLY HI ABS (MISCELLANEOUS)
BNDG CMPR 9X4 STRL LF SNTH (GAUZE/BANDAGES/DRESSINGS) ×1
BNDG ELASTIC 3INX 5YD STR LF (GAUZE/BANDAGES/DRESSINGS) ×1 IMPLANT
BNDG ELASTIC 4INX 5YD STR LF (GAUZE/BANDAGES/DRESSINGS) ×1 IMPLANT
BNDG ESMARK 4X9 LF (GAUZE/BANDAGES/DRESSINGS) ×1 IMPLANT
BNDG GAUZE DERMACEA FLUFF 4 (GAUZE/BANDAGES/DRESSINGS) ×1 IMPLANT
BNDG GZE 12X3 1 PLY HI ABS (GAUZE/BANDAGES/DRESSINGS) ×1
BNDG GZE DERMACEA 4 6PLY (GAUZE/BANDAGES/DRESSINGS) ×1
BNDG STRETCH GAUZE 3IN X12FT (GAUZE/BANDAGES/DRESSINGS) IMPLANT
CHLORAPREP W/TINT 26 (MISCELLANEOUS) ×1 IMPLANT
COVER BACK TABLE 60X90IN (DRAPES) ×1 IMPLANT
COVER K-WIRE PIN .062/1.6 (PIN)
CUFF TOURN SGL QUICK 18X4 (TOURNIQUET CUFF) IMPLANT
CUFF TOURN SGL QUICK 24 (TOURNIQUET CUFF)
CUFF TRNQT CYL 24X4X16.5-23 (TOURNIQUET CUFF) IMPLANT
DRAPE 3/4 80X56 (DRAPES) ×1 IMPLANT
DRAPE C-ARM 35X43 STRL (DRAPES) IMPLANT
DRAPE EXTREMITY T 121X128X90 (DISPOSABLE) ×1 IMPLANT
DRAPE U-SHAPE 47X51 STRL (DRAPES) ×1 IMPLANT
ELECT REM PT RETURN 9FT ADLT (ELECTROSURGICAL) ×1
ELECTRODE REM PT RTRN 9FT ADLT (ELECTROSURGICAL) ×1 IMPLANT
GAUZE 4X4 16PLY ~~LOC~~+RFID DBL (SPONGE) ×1 IMPLANT
GAUZE SPONGE 4X4 12PLY STRL (GAUZE/BANDAGES/DRESSINGS) ×1 IMPLANT
GAUZE STRETCH 2X75IN STRL (MISCELLANEOUS) IMPLANT
GAUZE XEROFORM 1X8 LF (GAUZE/BANDAGES/DRESSINGS) ×1 IMPLANT
GLOVE BIO SURGEON STRL SZ7.5 (GLOVE) ×1 IMPLANT
GLOVE BIO SURGEON STRL SZ8 (GLOVE) ×1 IMPLANT
GLOVE BIOGEL PI IND STRL 8 (GLOVE) ×1 IMPLANT
GOWN STRL REUS W/TWL 2XL LVL3 (GOWN DISPOSABLE) ×1 IMPLANT
K-WIRE SURGICAL 1.6X102 (WIRE) IMPLANT
KIT TURNOVER CYSTO (KITS) ×1 IMPLANT
NDL HYPO 25X1 1.5 SAFETY (NEEDLE) ×1 IMPLANT
NEEDLE HYPO 25X1 1.5 SAFETY (NEEDLE) ×1 IMPLANT
NS IRRIG 500ML POUR BTL (IV SOLUTION) ×1 IMPLANT
PACK BASIN DAY SURGERY FS (CUSTOM PROCEDURE TRAY) ×1 IMPLANT
PENCIL SMOKE EVACUATOR (MISCELLANEOUS) ×1 IMPLANT
PIN CAPS ORTHO GREEN .062 (PIN) IMPLANT
PIN GUARD 1.57MM GREEN STERILE (PIN) IMPLANT
SLEEVE SCD COMPRESS KNEE MED (STOCKING) ×1 IMPLANT
STAPLER VISISTAT 35W (STAPLE) IMPLANT
STOCKINETTE 6  STRL (DRAPES) ×1
STOCKINETTE 6 STRL (DRAPES) ×1 IMPLANT
SUCTION FRAZIER HANDLE 10FR (MISCELLANEOUS) ×1
SUCTION TUBE FRAZIER 10FR DISP (MISCELLANEOUS) ×1 IMPLANT
SUT ETHILON 4 0 PS 2 18 (SUTURE) IMPLANT
SUT MNCRL AB 3-0 PS2 27 (SUTURE) IMPLANT
SUT MNCRL AB 4-0 PS2 18 (SUTURE) ×1 IMPLANT
SUT MON AB 5-0 PS2 18 (SUTURE) IMPLANT
SUT VIC AB 2-0 PS2 27 (SUTURE) IMPLANT
SUT VIC AB 2-0 SH 27 (SUTURE)
SUT VIC AB 2-0 SH 27XBRD (SUTURE) IMPLANT
SUT VIC AB 3-0 FS2 27 (SUTURE) IMPLANT
SUT VIC AB 4-0 PS2 27 (SUTURE) IMPLANT
SYR BULB EAR ULCER 3OZ GRN STR (SYRINGE) ×1 IMPLANT
SYR CONTROL 10ML LL (SYRINGE) ×1 IMPLANT
TOWEL OR 17X24 6PK STRL BLUE (TOWEL DISPOSABLE) ×1 IMPLANT
TRAY DSU PREP LF (CUSTOM PROCEDURE TRAY) IMPLANT
UNDERPAD 30X36 HEAVY ABSORB (UNDERPADS AND DIAPERS) ×1 IMPLANT
YANKAUER SUCT BULB TIP NO VENT (SUCTIONS) IMPLANT

## 2023-05-14 NOTE — Anesthesia Preprocedure Evaluation (Signed)
Anesthesia Evaluation  Patient identified by MRN, date of birth, ID band Patient awake    Reviewed: Allergy & Precautions, H&P , NPO status , Patient's Chart, lab work & pertinent test results  Airway Mallampati: II   Neck ROM: full    Dental   Pulmonary sleep apnea    breath sounds clear to auscultation       Cardiovascular hypertension,  Rhythm:regular Rate:Normal     Neuro/Psych  PSYCHIATRIC DISORDERS Anxiety Depression       GI/Hepatic   Endo/Other  diabetes, Type 2    Renal/GU      Musculoskeletal  (+) Arthritis ,    Abdominal   Peds  Hematology   Anesthesia Other Findings   Reproductive/Obstetrics                             Anesthesia Physical Anesthesia Plan  ASA: 2  Anesthesia Plan: MAC and Regional   Post-op Pain Management:    Induction: Intravenous  PONV Risk Score and Plan: 2 and Propofol infusion, Midazolam, Ondansetron and Treatment may vary due to age or medical condition  Airway Management Planned: Simple Face Mask  Additional Equipment:   Intra-op Plan:   Post-operative Plan:   Informed Consent: I have reviewed the patients History and Physical, chart, labs and discussed the procedure including the risks, benefits and alternatives for the proposed anesthesia with the patient or authorized representative who has indicated his/her understanding and acceptance.     Dental advisory given  Plan Discussed with: CRNA, Surgeon and Anesthesiologist  Anesthesia Plan Comments:        Anesthesia Quick Evaluation

## 2023-05-14 NOTE — Progress Notes (Signed)
AssistedDr. Chaney Malling with left, popliteal, ultrasound guided popliteal block. Side rails up, monitors on throughout procedure. See vital signs in flow sheet. Tolerated Procedure well.

## 2023-05-14 NOTE — H&P (Signed)
History and Physical Interval Note:  05/14/2023 11:50 AM  Sabrina Roberts  has presented today for surgery, with the diagnosis of left foot tailors bunion.  The various methods of treatment have been discussed with the patient and family. After consideration of risks, benefits and other options for treatment, the patient has consented to   Procedure(s) with comments: METATARSAL OSTEOTOMY (Left) - POP BLOCK as a surgical intervention.  The patient's history has been reviewed, patient examined, no change in status, stable for surgery.  I have reviewed the patient's chart and labs.  Questions were answered to the patient's satisfaction.      Paulla Dolly

## 2023-05-14 NOTE — Progress Notes (Signed)
Orthopedic Tech Progress Note Patient Details:  Sabrina Roberts [facility] 28-Nov-1971 [id number]  Ortho Devices Type of Ortho Device: CAM walker Ortho Device/Splint Location: LLE Ortho Device/Splint Interventions: Application   Post Interventions Patient Tolerated: Well  Sabrina Roberts 05/14/2023, 2:05 PM

## 2023-05-14 NOTE — Discharge Instructions (Addendum)
Post-Operative Discharge Instructions  Patient name: Sabrina Roberts MRN: [id number] DOB: 11/05/1971  Date of Surgery: 05/14/2023            Location: [facility]  Follow up appointment:  Next week  On the Day of Surgery: Please pick up prescription(s) prior to the morning of surgery.  Please go directly to the couch or bed and keep your feet elevated by putting two pillows under your feet and one pillow under your knees. Keep your feet out from under the blanket.   Discomfort and Swelling: Your foot may be numb for the remainder of the day. Swelling is expected. In some cases, the skin of the foot or the leg may take on a bruised, black and blue appearance.   Temperature: Take your temperature on the 2nd, 3rd, and 4th day after your surgery at 5:00pm. If your temperature is above 101 degrees, please call the physician's office.   Bleeding: A slight amount of drainage on the dressing is normal. Resting your foot in an elevated position will limit bleeding. If bleeding continues, wrap a towel around your foot and apply an ice pack.   Dressing: Keep the bandage absolutely dry and DO NOT remove the dressing.  If the dressing becomes wet or soiled, call your physician's office immediately.   Stitches: The stitches will remain in place for about two weeks, depending on the nature of your operation. Slight pulling sensation may be felt due to the stitches. This is a normal occurrence.   Ice: Apply a well-sealed ice pack to your foot or behind your knee for 20 minutes out of every hour for the first 24 hours after your surgery. DO NOT leave the ice pack in place at bedtime or during long naps. Do not place an ice pack directly on skin, place a washcloth between the ice pack and the dressing. This will help avoid getting the dressing wet, and help avoid any ice burn.  Weight Bearing Status:    Limited weight bearing in shoe/boot     Shoes: Wear your post-operative shoe or boot  anytime you put weight on your feet, unless noted above. DO NOT DRIVE WITH BOOT ON RIGHT FOOT.  Diet: Return to your regular diet slowly within 24 hours. If you received sedation, your first meal should be light. Do not eat greasy or spicy foods for the first meal. Drink large quantities of liquids, especially water, citrus and other fruit juices. Please call if you're unable to keep fluids down. Eat food with your medication.  General Activities: Recovery is a gradual process; however you should feel better with each passing day. The first day, leave the bed only to go to the bathroom. Gradually increase your activity after the first day. For the first week or two, resting each day is important. Strenuous work, heavy lifting and excessive social activities should be avoided. Rest. Ice. Elevate.  Postoperative Care: The post-operative care periods lasts for approximately 6-12 weeks. During this time, periodic visits to your physician's office will be required so your healing process can be monitored closely. It is essential for your recovery that you continue to be monitored by your physician until you are discharged and completely healed. Care during this time is the most important part of your recovery process.   Recovery from anesthesia: You may feel drowsy and reflexes may be slowed for 24 hours. Do not drive, use machinery, appliances, ride bicycles, or scooter. Do not make important decisions.  Complications: Please call your physician following surgery if you have any of the following complications:          1.)  Severe pain unrelieved by medication   2.) Excessive heavy or prolonged bleeding   3.) Dizziness or fainting   4.) Soiled or wet dressings   5.) Temperature over 101.0 degrees     Regional Anesthesia Blocks  1. Numbness or the inability to move the "blocked" extremity may last from 3-48 hours after placement. The length of time depends on the medication injected and your  individual response to the medication. If the numbness is not going away after 48 hours, call your surgeon.  2. The extremity that is blocked will need to be protected until the numbness is gone and the  Strength has returned. Because you cannot feel it, you will need to take extra care to avoid injury. Because it may be weak, you may have difficulty moving it or using it. You may not know what position it is in without looking at it while the block is in effect.  3. For blocks in the legs and feet, returning to weight bearing and walking needs to be done carefully. You will need to wait until the numbness is entirely gone and the strength has returned. You should be able to move your leg and foot normally before you try and bear weight or walk. You will need someone to be with you when you first try to ensure you do not fall and possibly risk injury.  4. Bruising and tenderness at the needle site are common side effects and will resolve in a few days.  5. Persistent numbness or new problems with movement should be communicated to the surgeon or the [facility] [phone] [facility] [phone]).      Post Anesthesia Home Care Instructions  Activity: Get plenty of rest for the remainder of the day. A responsible individual must stay with you for 24 hours following the procedure.  For the next 24 hours, DO NOT: -Drive a car -Advertising copywriter -Drink alcoholic beverages -Take any medication unless instructed by your physician -Make any legal decisions or sign important papers.  Meals: Start with liquid foods such as gelatin or soup. Progress to regular foods as tolerated. Avoid greasy, spicy, heavy foods. If nausea and/or vomiting occur, drink only clear liquids until the nausea and/or vomiting subsides. Call your physician if vomiting continues.  Special Instructions/Symptoms: Your throat may feel dry or sore from the anesthesia or the breathing tube  placed in your throat during surgery. If this causes discomfort, gargle with warm salt water. The discomfort should disappear within 24 hours.

## 2023-05-14 NOTE — Op Note (Signed)
OPERATIVE REPORT Patient name: Sabrina Roberts MRN: [id number] DOB: 1971-10-17  DOS:  05/14/2023  Preop Dx: Type 2 diabetes mellitus without complication, without long-term current use of insulin (HCC) - Plan: CBG per Guidelines for Diabetes Management for Patients Undergoing Surgery ([facility], [facility], and [facility] only), CBG per protocol, CBG per Guidelines for Diabetes Management for Patients Undergoing Surgery ([facility], [facility], and [facility] only), CBG per protocol Postop Dx: same  Procedure:  1. Tailors bunionectomy with distal osteotomy  Surgeon: Hinton Rao, DPM  Anesthesia: Pop block and MAC  Hemostasis: pressure.  EBL: 5 mL Materials: 0.062 kirschner wire. Injectables: none Pathology: none  Condition: The patient tolerated the procedure and anesthesia well. No complications noted or reported   Justification for procedure: The patient is a 52 y.o. female who presents today for surgical correction of tailors bunion left foot. Patient has been suffering for years with irritation plantarly and laterally.All conservative modalities of been unsuccessful in providing any sort of satisfactory alleviation of symptoms with the patient. The patient was identified preoperatively and the correct operative extremity was marked by myself. Patient was educated pre-operatively on risks, complications, and alternatives. The patient consented for surgical correction. The patient consent form was reviewed. All patient questions were answered. No guarantees were expressed or implied. The patient and the surgeon both signed the patient consent form with the witness present and placed in the patient's chart.   Procedure in Detail: The patient was brought to the operating room, placed on the operating table in a supine position and anesthesia was induced. The left lower extremity was prepped and draped in a standard sterile fashion and a timeout was performed to verify the correct operative site. Attention was then directed to the  surgical area where procedure number one commenced.  Procedure #1: Distal Tailors Bunionectomy with osteotomy CPT 28308:  I began the case by creating a direct lateral incision at the level of the proximal fifth metatarsophalangeal joint.  The incision was carefully deepened down through the soft tissues to the level of the capsule.  The capsule was incised longitudinally along its medial aspect and then elevated along the dorsal aspect of the first metatarsal head.  An oscillating saw was utilized to resect the lateral eminence.  We then proceeded to utilize the oscillating saw to create a transverse osteotomy through the neck of the fifth metatarsal.  The osteotomy site was freed up with a hand osteotome and the capital fragment was then shifted approximately 6 mm laterally with a slight dorsal elevation.  I was happy with the position and proceeded to then pin the correction with a 0.062 kirschner wire.  A fluoroscopic image was obtained and I was happy with the overall alignment clinically and fluoroscopically.  The lateral capsule was closed with interrupted 2-0 Vicryl horizontal mattress sutures. Skin closure was achieved with a 4-0 nylon suture.   Dry sterile compressive dressings were then applied to all previously mentioned incision sites about the patient's lower extremity. All normal neurovascular responses including pink color and warmth returned all the digits of patient's lower extremity.  The patient was then transferred from the operating room to the recovery room having tolerated the procedure and anesthesia well. All vital signs are stable. After a brief stay in the recovery room the patient was discharged with adequate prescriptions for analgesia. Verbal as well as written instructions were provided for the patient regarding wound care. The patient is to keep the dressings clean dry and intact until they are to follow  surgeon Dr. Hinton Rao, DPM in the office upon discharge.   Hinton Rao DPM

## 2023-05-14 NOTE — Transfer of Care (Signed)
Immediate Anesthesia Transfer of Care Note  Patient: Sabrina Roberts  Procedure(s) Performed: METATARSAL OSTEOTOMY (Left: Toe)  Patient Location: PACU  Anesthesia Type:MAC  Level of Consciousness: awake, alert , and oriented  Airway & Oxygen Therapy: Patient Spontanous Breathing and Patient connected to face mask oxygen  Post-op Assessment: Report given to RN and Post -op Vital signs reviewed and stable  Post vital signs: Reviewed and stable  Last Vitals:  Vitals Value Taken Time  BP    Temp    Pulse    Resp    SpO2      Last Pain:  Vitals:   05/14/23 1045  TempSrc:   PainSc: 0-No pain      Patients Stated Pain Goal: 7 (05/14/23 0919)  Complications: No notable events documented.

## 2023-05-14 NOTE — Anesthesia Procedure Notes (Signed)
Anesthesia Regional Block: Popliteal block   Pre-Anesthetic Checklist: , timeout performed,  Correct Patient, Correct Site, Correct Laterality,  Correct Procedure, Correct Position, site marked,  Risks and benefits discussed,  Surgical consent,  Pre-op evaluation,  At surgeon's request and post-op pain management  Laterality: Left  Prep: chloraprep       Needles:  Injection technique: Single-shot  Needle Type: Echogenic Stimulator Needle          Additional Needles:   Procedures:, nerve stimulator,,,,,     Nerve Stimulator or Paresthesia:  Response: plantar flexion of foot, 0.45 mA  Additional Responses:   Narrative:  Start time: 05/14/2023 9:50 AM End time: 05/14/2023 9:57 AM Injection made incrementally with aspirations every 5 mL.  Performed by: Personally  Anesthesiologist: Achille Rich, MD  Additional Notes: Functioning IV was confirmed and monitors were applied.  A 90mm 21ga Arrow echogenic stimulator needle was used. Sterile prep and drape,hand hygiene and sterile gloves were used.  Negative aspiration and negative test dose prior to incremental administration of local anesthetic. The patient tolerated the procedure well.  Ultrasound guidance: relevent anatomy identified, needle position confirmed, local anesthetic spread visualized around nerve(s), vascular puncture avoided.  Image printed for medical record.

## 2023-05-16 NOTE — Progress Notes (Signed)
Please inform the patient that her chest x-ray shows no active cardiopulmonary disease

## 2023-05-17 ENCOUNTER — Encounter (HOSPITAL_BASED_OUTPATIENT_CLINIC_OR_DEPARTMENT_OTHER): Payer: Self-pay | Admitting: Podiatry

## 2023-05-17 NOTE — Anesthesia Postprocedure Evaluation (Signed)
Anesthesia Post Note  Patient: Sabrina Roberts  Procedure(s) Performed: METATARSAL OSTEOTOMY (Left: Toe)     Patient location during evaluation: PACU Anesthesia Type: Regional and MAC Level of consciousness: awake and alert Pain management: pain level controlled Vital Signs Assessment: post-procedure vital signs reviewed and stable Respiratory status: spontaneous breathing, nonlabored ventilation, respiratory function stable and patient connected to nasal cannula oxygen Cardiovascular status: stable and blood pressure returned to baseline Postop Assessment: no apparent nausea or vomiting Anesthetic complications: no   No notable events documented.  Last Vitals:  Vitals:   05/14/23 1345 05/14/23 1356  BP: (!) 142/76 137/65  Pulse: 63 64  Resp: 16 16  Temp:    SpO2: 100% 100%    Last Pain:  Vitals:   05/14/23 1356  TempSrc:   PainSc: 0-No pain                 [name]

## 2023-06-03 ENCOUNTER — Other Ambulatory Visit (HOSPITAL_COMMUNITY): Payer: Self-pay | Admitting: Psychiatry

## 2023-06-03 DIAGNOSIS — F5101 Primary insomnia: Secondary | ICD-10-CM

## 2023-06-04 ENCOUNTER — Telehealth: Payer: Self-pay | Admitting: Family Medicine

## 2023-06-04 NOTE — Telephone Encounter (Signed)
Pt called and requested refills on the following meds   DULoxetine (CYMBALTA) 60 MG capsule [[id number]]  QUEtiapine (SEROQUEL) 50 MG tablet [[id number]]  Dulaglutide (TRULICITY) 3 MG/0.5ML SOPN [[id number]]

## 2023-06-04 NOTE — Telephone Encounter (Signed)
Prescriber not at this office

## 2023-06-07 ENCOUNTER — Other Ambulatory Visit: Payer: Self-pay | Admitting: Family Medicine

## 2023-06-07 DIAGNOSIS — R5383 Other fatigue: Secondary | ICD-10-CM

## 2023-06-15 ENCOUNTER — Other Ambulatory Visit: Payer: Self-pay

## 2023-06-15 ENCOUNTER — Telehealth: Payer: Self-pay | Admitting: Family Medicine

## 2023-06-15 ENCOUNTER — Other Ambulatory Visit: Payer: Self-pay | Admitting: Family Medicine

## 2023-06-15 DIAGNOSIS — E119 Type 2 diabetes mellitus without complications: Secondary | ICD-10-CM

## 2023-06-15 MED ORDER — TRULICITY 3 MG/0.5ML ~~LOC~~ SOAJ
3.0000 mg | SUBCUTANEOUS | 2 refills | Status: DC
Start: 2023-06-15 — End: 2023-10-06

## 2023-06-15 MED ORDER — BLOOD GLUCOSE SYSTEM PAK KIT
PACK | 1 refills | Status: DC
Start: 2023-06-15 — End: 2024-07-03

## 2023-06-15 NOTE — Telephone Encounter (Signed)
Rx sent for trulicity and blood glucose supplies, please advice on lyrica as it has not been filled by you before.

## 2023-06-15 NOTE — Progress Notes (Signed)
I called to inquire about the refill request for Lyrica 300 mg, as it hasn't been refilled since 2022. I left a detailed message asking the patient to call me back.

## 2023-06-15 NOTE — Telephone Encounter (Signed)
Patient called no longer gets meds from other provider Sabrina Roberts. Prescription Request  06/15/2023  LOV: 05/12/2023  What is the name of the medication or equipment? Dulaglutide (TRULICITY) 3 MG/0.5ML SOPN [[id number]   Blood Glucose Monitoring Suppl (BLOOD GLUCOSE SYSTEM PAK) KIT [[id number]]   Glucose monitor  pregabalin (LYRICA) 300 MG capsule [[id number]]    Have you contacted your pharmacy to request a refill? Yes   Which pharmacy would you like this sent to?  [facility]  Patient notified that their request is being sent to the clinical staff for review and that they should receive a response within 2 business days.   Please advise at Mobile [phone] (mobile)

## 2023-06-16 ENCOUNTER — Telehealth (INDEPENDENT_AMBULATORY_CARE_PROVIDER_SITE_OTHER): Payer: Medicaid Other | Admitting: Psychiatry

## 2023-06-16 ENCOUNTER — Encounter (HOSPITAL_COMMUNITY): Payer: Self-pay | Admitting: Psychiatry

## 2023-06-16 DIAGNOSIS — E559 Vitamin D deficiency, unspecified: Secondary | ICD-10-CM

## 2023-06-16 DIAGNOSIS — D519 Vitamin B12 deficiency anemia, unspecified: Secondary | ICD-10-CM

## 2023-06-16 DIAGNOSIS — D508 Other iron deficiency anemias: Secondary | ICD-10-CM

## 2023-06-16 DIAGNOSIS — F4310 Post-traumatic stress disorder, unspecified: Secondary | ICD-10-CM | POA: Diagnosis not present

## 2023-06-16 DIAGNOSIS — F411 Generalized anxiety disorder: Secondary | ICD-10-CM

## 2023-06-16 DIAGNOSIS — Z79899 Other long term (current) drug therapy: Secondary | ICD-10-CM

## 2023-06-16 DIAGNOSIS — D509 Iron deficiency anemia, unspecified: Secondary | ICD-10-CM | POA: Insufficient documentation

## 2023-06-16 DIAGNOSIS — F4381 Prolonged grief disorder: Secondary | ICD-10-CM

## 2023-06-16 DIAGNOSIS — F5101 Primary insomnia: Secondary | ICD-10-CM | POA: Diagnosis not present

## 2023-06-16 DIAGNOSIS — F331 Major depressive disorder, recurrent, moderate: Secondary | ICD-10-CM

## 2023-06-16 DIAGNOSIS — G2581 Restless legs syndrome: Secondary | ICD-10-CM

## 2023-06-16 DIAGNOSIS — F4001 Agoraphobia with panic disorder: Secondary | ICD-10-CM

## 2023-06-16 MED ORDER — DULOXETINE HCL 60 MG PO CPEP: 120.0000 mg | ORAL_CAPSULE | Freq: Every day | ORAL | 2 refills | Status: DC

## 2023-06-16 MED ORDER — QUETIAPINE FUMARATE 25 MG PO TABS
12.5000 mg | ORAL_TABLET | Freq: Two times a day (BID) | ORAL | 2 refills | Status: DC | PRN
Start: 2023-06-16 — End: 2024-01-13

## 2023-06-16 MED ORDER — QUETIAPINE FUMARATE 100 MG PO TABS
100.0000 mg | ORAL_TABLET | Freq: Every day | ORAL | 2 refills | Status: DC
Start: 2023-06-16 — End: 2023-10-04
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
## 2023-06-16 NOTE — Patient Instructions (Addendum)
We increased your nighttime Seroquel to 100 mg nightly.  We also added a much lower dose of Seroquel at 12.5 mg (half a 25 mg tablet) twice daily as needed for panic attacks.  This will probably be most effective prior to going to those anxiety causing places.  I have also placed a referral for psychotherapy.

## 2023-07-16 ENCOUNTER — Telehealth (HOSPITAL_COMMUNITY): Payer: Medicaid Other | Admitting: Psychiatry

## 2023-07-27 ENCOUNTER — Ambulatory Visit: Payer: No Typology Code available for payment source | Admitting: Family Medicine

## 2023-08-06 ENCOUNTER — Ambulatory Visit: Payer: No Typology Code available for payment source | Admitting: Family Medicine

## 2023-08-17 ENCOUNTER — Other Ambulatory Visit: Payer: Self-pay | Admitting: Family Medicine

## 2023-08-17 ENCOUNTER — Other Ambulatory Visit: Payer: Self-pay

## 2023-08-17 ENCOUNTER — Encounter (HOSPITAL_BASED_OUTPATIENT_CLINIC_OR_DEPARTMENT_OTHER): Payer: Self-pay | Admitting: Podiatry

## 2023-08-17 NOTE — Progress Notes (Signed)
Spoke w/ via phone for pre-op interview---pt Lab needs dos----  I stat             Lab results------EKG 05-12-2023 epic, chest xray 05-12-2023 epic COVID test -----patient states asymptomatic no test needed Arrive at -------700 am 08-25-2023 NPO after MN NO Solid Food.  Clear liquids from MN until---600 Med rec completed Medications to take morning of surgery -----cymbalta, valium prn, lyrica Diabetic medication -----none day of surgery Patient instructed no nail polish to be worn day of surgery Patient instructed to bring photo id and insurance card day of surgery Patient aware to have Driver (ride ) / caregiver    for 24 hours after surgery  daughter eboni Urey Patient Special Instructions -----none Pre-Op special Instructions -----none Patient verbalized understanding of instructions that were given at this phone interview. Patient denies shortness of breath, chest pain, fever, cough at this phone interview.  Pt getting h & p done Friday 08-20-2023 or Monday 08-23-2023 with dr Gilmore Laroche

## 2023-08-18 ENCOUNTER — Ambulatory Visit (HOSPITAL_COMMUNITY): Payer: Medicaid Other | Admitting: Psychiatry

## 2023-08-23 ENCOUNTER — Encounter: Payer: Self-pay | Admitting: Gastroenterology

## 2023-08-24 ENCOUNTER — Ambulatory Visit: Payer: No Typology Code available for payment source | Admitting: Family Medicine

## 2023-08-25 ENCOUNTER — Ambulatory Visit (HOSPITAL_BASED_OUTPATIENT_CLINIC_OR_DEPARTMENT_OTHER): Admission: RE | Admit: 2023-08-25 | Payer: Medicaid Other | Source: Home / Self Care | Admitting: Podiatry

## 2023-08-25 DIAGNOSIS — Z01818 Encounter for other preprocedural examination: Secondary | ICD-10-CM

## 2023-08-25 HISTORY — DX: Gestational (pregnancy-induced) hypertension without significant proteinuria, unspecified trimester: O13.9

## 2023-08-25 HISTORY — DX: Unspecified osteoarthritis, unspecified site: M19.90

## 2023-08-25 HISTORY — DX: Sciatica, unspecified side: M54.30

## 2023-08-25 HISTORY — DX: Presence of spectacles and contact lenses: Z97.3

## 2023-08-25 SURGERY — BUNIONECTOMY
Anesthesia: Monitor Anesthesia Care | Site: Toe | Laterality: Left

## 2023-09-03 ENCOUNTER — Ambulatory Visit: Payer: No Typology Code available for payment source | Admitting: Family Medicine

## 2023-09-07 ENCOUNTER — Encounter: Payer: Self-pay | Admitting: Family Medicine

## 2023-09-14 ENCOUNTER — Telehealth: Payer: Self-pay

## 2023-09-14 NOTE — Telephone Encounter (Signed)
Multiple attempts made to reach patient for PV. Unable to leave a VM on the cell or house number. PV will need to be r/s by 5 PM to avoid cancellation of scheduled procedure. No show letter will be sent to [employer] and address on file in the event patient does not r/s.

## 2023-10-01 ENCOUNTER — Other Ambulatory Visit: Payer: Self-pay | Admitting: Family Medicine

## 2023-10-01 ENCOUNTER — Other Ambulatory Visit (HOSPITAL_COMMUNITY): Payer: Self-pay | Admitting: Psychiatry

## 2023-10-01 DIAGNOSIS — F5101 Primary insomnia: Secondary | ICD-10-CM

## 2023-10-01 DIAGNOSIS — E559 Vitamin D deficiency, unspecified: Secondary | ICD-10-CM

## 2023-10-05 ENCOUNTER — Encounter: Payer: Medicaid Other | Admitting: Gastroenterology

## 2023-10-06 ENCOUNTER — Encounter: Payer: Self-pay | Admitting: Family Medicine

## 2023-10-06 ENCOUNTER — Ambulatory Visit (INDEPENDENT_AMBULATORY_CARE_PROVIDER_SITE_OTHER): Payer: Medicare Other | Admitting: Family Medicine

## 2023-10-06 VITALS — BP 136/84 | HR 76 | Ht 64.0 in | Wt 216.1 lb

## 2023-10-06 DIAGNOSIS — E7849 Other hyperlipidemia: Secondary | ICD-10-CM

## 2023-10-06 DIAGNOSIS — E559 Vitamin D deficiency, unspecified: Secondary | ICD-10-CM

## 2023-10-06 DIAGNOSIS — E119 Type 2 diabetes mellitus without complications: Secondary | ICD-10-CM | POA: Diagnosis not present

## 2023-10-06 DIAGNOSIS — G2581 Restless legs syndrome: Secondary | ICD-10-CM | POA: Diagnosis not present

## 2023-10-06 DIAGNOSIS — E038 Other specified hypothyroidism: Secondary | ICD-10-CM

## 2023-10-06 MED ORDER — TRULICITY 3 MG/0.5ML ~~LOC~~ SOAJ: 3.0000 mg | SUBCUTANEOUS | 2 refills | Status: DC

## 2023-10-06 NOTE — Assessment & Plan Note (Signed)
Inform the patient that she is on Requip and does not need to take Lyrica for restless leg syndrome Encourage to follow-up with neurology as scheduled

## 2023-10-06 NOTE — Assessment & Plan Note (Signed)
Encouraged to take atorvastatin 10 mg daily Recommended decreasing her intake of greasy, starchy, fatty foods with increase physical activity Lab Results  Component Value Date   CHOL 169 01/13/2023   HDL 73 01/13/2023   LDLCALC 79 01/13/2023   TRIG 94 01/13/2023   CHOLHDL 2.3 01/13/2023

## 2023-10-06 NOTE — Patient Instructions (Addendum)
  I appreciate the opportunity to provide care to you today!    Follow up:  4 months  Fasting Labs: please stop by the lab during the week to get your blood drawn (CBC, CMP, TSH, Lipid profile, HgA1c, Vit D)   Please schedule a welcome to medicare annual wellness.    Please continue to a heart-healthy diet and increase your physical activities. Try to exercise for at least five days a week.    It was a pleasure to see you and I look forward to continuing to work together on your health and well-being. Please do not hesitate to call the office if you need care or have questions about your care.  In case of emergency, please visit the Emergency Department for urgent care, or contact our clinic at [phone] to schedule an appointment. We're here to help you!   Have a wonderful day and week. With Gratitude, Gilmore Laroche MSN, FNP-BC

## 2023-10-06 NOTE — Progress Notes (Signed)
Established Patient Office Visit  Subjective:  Patient ID: Sabrina Roberts, female    DOB: 18-Jan-1971  Age: 52 y.o. MRN: [id number]  CC:  Chief Complaint  Patient presents with   Care Management    Follow up. Needs to discuss lyrica   Medication Problem    Pt want to discuss medications today, which she should take and times.     HPI Sabrina Roberts is a 52 y.o. female with past medical history of type 2 diabetes, restless leg syndrome, hyperlipidemia presents for f/u of  chronic medical conditions.  Restless leg syndrome: The patient is currently under the care of neurology and is being treated with Requip for restless leg syndrome. She has a history of taking Lyrica but is contemplating whether to resume therapy with it.   Type 2 Diabetes: She reports that she has not been able to pick up Trulicity, as the pharmacy has been out of stock for the medication. She denies any symptoms of polyuria, polyphagia, or dyspnea.  Hyperlipidemia: She takes atorvastatin 10 mg daily and reports treatment compliance.   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   Sleep apnea    Has CPAP but has lost weight and hasnt used   Wears contact lenses     Past Surgical History:  Procedure Laterality Date   CARPAL TUNNEL RELEASE Right 10/01/2016   Procedure: RIGHT CARPAL TUNNEL RELEASE;  Surgeon: Vickki Hearing, MD;  Location: [facility];  Service: Orthopedics;  Laterality: Right;   CARPAL TUNNEL RELEASE Left 10/22/2016   Procedure: CARPAL TUNNEL RELEASE;  Surgeon: Vickki Hearing, MD;  Location: [facility];  Service: Orthopedics;  Laterality: Left;   CESAREAN SECTION  2002   CHOLECYSTECTOMY  dec 2012    GASTRIC BYPASS  dec 2012   KNEE ARTHROSCOPY Right    METATARSAL OSTEOTOMY Left 05/14/2023   Procedure: METATARSAL OSTEOTOMY;  Surgeon: Kieth Brightly, DPM;  Location: [facility];  Service: Podiatry;  Laterality: Left;  POP BLOCK    Family History  Problem Relation Age of Onset   Hypertension Mother    Depression Mother    Diabetes Mother    Irritable bowel syndrome Mother    Heart disease Mother    Stroke Mother    Hypertension Father    Hyperlipidemia Father    Prostate cancer Father    Colon polyps Father    Heart disease Father    Kidney Stones Father    Diverticulitis Father    Colon cancer Father    Breast cancer Paternal Grandmother    Colon cancer Paternal Grandfather    Diabetes Maternal Grandmother    Breast cancer Maternal Aunt    Stomach cancer Paternal Uncle    Colon cancer Other        Paternal Great Uncles x 4   Colon polyps Paternal Aunt        x 3    Social History   Socioeconomic History   Marital status: Widowed    Spouse name: Not on file   Number of children: Not on file  Years of education: Not on file   Highest education level: Not on file  Occupational History   Not on file  Tobacco Use   Smoking status: Never    Passive exposure: Current   Smokeless tobacco: Never  Vaping Use   Vaping status: Never Used  Substance and Sexual Activity   Alcohol use: Yes    Comment: 1 mixed drink every week or every month   Drug use: No   Sexual activity: Yes    Partners: Male    Birth control/protection: None  Other Topics Concern   Not on file  Social History Narrative   Not on file   Social Determinants of Health   Financial Resource Strain: Not on file  Food Insecurity: Not on file  Transportation Needs: Not on file  Physical Activity: Not on file  Stress: Not on file  Social Connections: Not on file  Intimate Partner Violence: Not on file    Outpatient Medications Prior to Visit  Medication Sig Dispense Refill    atorvastatin (LIPITOR) 10 MG tablet Take 1 tablet (10 mg total) by mouth daily. 90 tablet 1   Blood Glucose Monitoring Suppl (BLOOD GLUCOSE SYSTEM PAK) KIT Please dispense based on patient and insurance preference.  Use as directed to monitor FSBS 1-2 times daily.  Dx E11.9 1 kit 1   cyanocobalamin (VITAMIN B12) 1000 MCG/ML injection 1 mL Injection inject 1 im once a week for 30 days     diazepam (VALIUM) 5 MG tablet Take 1 tablet (5 mg total) by mouth every 12 (twelve) hours as needed for anxiety. 5 tablet 0   DULoxetine (CYMBALTA) 60 MG capsule Take 2 capsules (120 mg total) by mouth daily. 60 capsule 2   FEROSUL 325 (65 Fe) MG tablet Take 1 tablet by mouth once daily with breakfast 30 tablet 0   folic acid (FOLVITE) 1 MG tablet Take 1 mg by mouth daily.     folic acid (FOLVITE) 400 MCG tablet Take 400 mcg by mouth daily.     glucose blood test strip Please dispense based on patient and insurance preference.  Use as directed to monitor FSBS 1-2 times daily.  Dx E11.9 100 each 11   HYDROcodone-acetaminophen (NORCO) 5-325 MG tablet Take 1 tablet by mouth every 6 (six) hours as needed for severe pain. 6 tablet 0   ketoconazole (NIZORAL) 2 % cream Apply topically 2 (two) times daily.     Lancets MISC Please dispense based on patient and insurance preference.  Use as directed to monitor FSBS 1-2 times daily.  Dx E11.9 100 each 11   Multiple Vitamin (MULTIVITAMIN) tablet Take 1 tablet by mouth daily.      QUEtiapine (SEROQUEL) 100 MG tablet TAKE 1 TABLET BY MOUTH AT BEDTIME 30 tablet 0   QUEtiapine (SEROQUEL) 25 MG tablet Take 0.5 tablets (12.5 mg total) by mouth 2 (two) times daily as needed (panic attacks). 60 tablet 2   rOPINIRole (REQUIP) 0.5 MG tablet Take 0.5-1.5 mg by mouth at bedtime.     Vitamin D, Ergocalciferol, (DRISDOL) 1.25 MG (50000 UNIT) CAPS capsule Take 1 capsule by mouth once a week 10 capsule 0   diclofenac (VOLTAREN) 75 MG EC tablet Take 1 tablet (75 mg total) by mouth 2 (two) times  daily. 60 tablet 1   LYRICA 150 MG capsule Lyrica 150 mg (Patient not taking: Reported on 10/06/2023)     pregabalin (LYRICA) 300 MG capsule Take 1 capsule (300 mg total) by mouth daily. (Patient  not taking: Reported on 10/06/2023) 90 capsule 1   Dulaglutide (TRULICITY) 3 MG/0.5ML SOPN Inject 3 mg as directed once a week. (Patient not taking: Reported on 08/17/2023) 3 mL 2   cyanocobalamin ((VITAMIN B-12)) injection 1,000 mcg      No facility-administered medications prior to visit.    Allergies  Allergen Reactions   Actos [Pioglitazone Hydrochloride] Anaphylaxis, Hives and Other (See Comments)    Chest pain    Latex Rash    ROS Review of Systems  Constitutional:  Negative for chills and fever.  Eyes:  Negative for visual disturbance.  Respiratory:  Negative for chest tightness and shortness of breath.   Neurological:  Negative for dizziness and headaches.      Objective:    Physical Exam HENT:     Head: Normocephalic.     Mouth/Throat:     Mouth: Mucous membranes are moist.  Cardiovascular:     Rate and Rhythm: Normal rate.     Heart sounds: Normal heart sounds.  Pulmonary:     Effort: Pulmonary effort is normal.     Breath sounds: Normal breath sounds.  Neurological:     Mental Status: She is alert.     BP 136/84   Pulse 76   Ht 5\' 4"  (1.626 m)   Wt 216 lb 1.3 oz (98 kg)   LMP  (LMP Unknown)   SpO2 96%   BMI 37.09 kg/m  Wt Readings from Last 3 Encounters:  10/06/23 216 lb 1.3 oz (98 kg)  05/14/23 217 lb 11.2 oz (98.7 kg)  05/12/23 219 lb 0.6 oz (99.4 kg)    Lab Results  Component Value Date   TSH 0.972 01/13/2023   Lab Results  Component Value Date   WBC 6.5 05/12/2023   HGB 11.6 05/12/2023   HCT 35.6 05/12/2023   MCV 99 (H) 05/12/2023   PLT 308 05/12/2023   Lab Results  Component Value Date   NA 141 05/12/2023   K 4.9 05/12/2023   CO2 18 (L) 05/12/2023   GLUCOSE 107 (H) 05/12/2023   BUN 18 05/12/2023   CREATININE 0.99 05/12/2023   BILITOT  0.5 01/13/2023   ALKPHOS 142 (H) 01/13/2023   AST 18 01/13/2023   ALT 13 01/13/2023   PROT 6.7 01/13/2023   ALBUMIN 4.2 01/13/2023   CALCIUM 9.1 05/12/2023   ANIONGAP 8 02/14/2022   EGFR 69 05/12/2023   Lab Results  Component Value Date   CHOL 169 01/13/2023   Lab Results  Component Value Date   HDL 73 01/13/2023   Lab Results  Component Value Date   LDLCALC 79 01/13/2023   Lab Results  Component Value Date   TRIG 94 01/13/2023   Lab Results  Component Value Date   CHOLHDL 2.3 01/13/2023   Lab Results  Component Value Date   HGBA1C 6.0 (H) 05/12/2023      Assessment & Plan:  Type 2 diabetes mellitus without complication, without long-term current use of insulin (HCC) Assessment & Plan: Will reinstate therapy today on Trulicity 3 mg weekly Recommended decreasing her intake of high sugary foods and beverages with increased physical activity Lab Results  Component Value Date   HGBA1C 6.0 (H) 05/12/2023     Orders: -     Hemoglobin A1c -     Trulicity; Inject 3 mg as directed once a week.  Dispense: 2 mL; Refill: 2  Other hyperlipidemia Assessment & Plan: Encouraged to take atorvastatin 10 mg daily Recommended decreasing her intake of  greasy, starchy, fatty foods with increase physical activity Lab Results  Component Value Date   CHOL 169 01/13/2023   HDL 73 01/13/2023   LDLCALC 79 01/13/2023   TRIG 94 01/13/2023   CHOLHDL 2.3 01/13/2023     Orders: -     Lipid panel -     CMP14+EGFR -     CBC with Differential/Platelet  RLS (restless legs syndrome) Assessment & Plan: Inform the patient that she is on Requip and does not need to take Lyrica for restless leg syndrome Encourage to follow-up with neurology as scheduled   Vitamin D deficiency -     VITAMIN D 25 Hydroxy (Vit-D Deficiency, Fractures)  TSH (thyroid-stimulating hormone deficiency) -     TSH + free T4  Note: This chart has been completed using Engineer, civil (consulting) software, and  while attempts have been made to ensure accuracy, certain words and phrases may not be transcribed as intended.    Follow-up: Return in about 4 months (around 02/06/2024).   Gilmore Laroche, FNP

## 2023-10-06 NOTE — Assessment & Plan Note (Addendum)
Will reinstate therapy today on Trulicity 3 mg weekly Recommended decreasing her intake of high sugary foods and beverages with increased physical activity Lab Results  Component Value Date   HGBA1C 6.0 (H) 05/12/2023

## 2023-10-08 ENCOUNTER — Ambulatory Visit (AMBULATORY_SURGERY_CENTER): Payer: Medicaid Other

## 2023-10-08 VITALS — Ht 64.0 in | Wt 216.0 lb

## 2023-10-08 DIAGNOSIS — Z860100 Personal history of colon polyps, unspecified: Secondary | ICD-10-CM

## 2023-10-08 MED ORDER — NA SULFATE-K SULFATE-MG SULF 17.5-3.13-1.6 GM/177ML PO SOLN
1.0000 | Freq: Once | ORAL | 0 refills | Status: AC
Start: 2023-10-08 — End: 2023-10-08

## 2023-10-08 NOTE — Progress Notes (Signed)

## 2023-10-11 ENCOUNTER — Telehealth (INDEPENDENT_AMBULATORY_CARE_PROVIDER_SITE_OTHER): Payer: Self-pay | Admitting: Psychiatry

## 2023-10-11 DIAGNOSIS — Z91199 Patient's noncompliance with other medical treatment and regimen due to unspecified reason: Secondary | ICD-10-CM

## 2023-10-12 NOTE — Progress Notes (Signed)
   Complete physical exam  Patient: Sabrina Roberts   DOB: 10/17/1999   52 y.o. Female  MRN: [id number]  Subjective:    No chief complaint on file.   Sabrina Roberts is a 52 y.o. female who presents today for a complete physical exam. She reports consuming a {diet types:17450} diet. {types:19826} She generally feels {DESC; WELL/FAIRLY WELL/POORLY:18703}. She reports sleeping {DESC; WELL/FAIRLY WELL/POORLY:18703}. She {does/does not:200015} have additional problems to discuss today.    Most recent fall risk assessment:    06/24/2022   10:42 AM  Fall Risk   Falls in the past year? 0  Number falls in past yr: 0  Injury with Fall? 0  Risk for fall due to : No Fall Risks  Follow up Falls evaluation completed     Most recent depression screenings:    06/24/2022   10:42 AM 05/15/2021   10:46 AM  PHQ 2/9 Scores  PHQ - 2 Score 0 0  PHQ- 9 Score 5     {VISON DENTAL STD PSA (Optional):27386}  {History (Optional):23778}  Patient Care Team: Jessup, Joy, NP as PCP - General (Nurse Practitioner)   Outpatient Medications Prior to Visit  Medication Sig   fluticasone (FLONASE) 50 MCG/ACT nasal spray Place 2 sprays into both nostrils in the morning and at bedtime. After 7 days, reduce to once daily.   norgestimate-ethinyl estradiol (SPRINTEC 28) 0.25-35 MG-MCG tablet Take 1 tablet by mouth daily.   Nystatin POWD Apply liberally to affected area 2 times per day   spironolactone (ALDACTONE) 100 MG tablet Take 1 tablet (100 mg total) by mouth daily.   No facility-administered medications prior to visit.    ROS        Objective:     There were no vitals taken for this visit. {Vitals History (Optional):23777}  Physical Exam   No results found for any visits on 07/30/22. {Show previous labs (optional):23779}    Assessment & Plan:    Routine Health Maintenance and Physical Exam  Immunization History  Administered Date(s) Administered   DTaP 12/31/1999, 02/26/2000,  05/06/2000, 01/20/2001, 08/05/2004   Hepatitis A 06/01/2008, 06/07/2009   Hepatitis B 10/18/1999, 11/25/1999, 05/06/2000   HiB (PRP-OMP) 12/31/1999, 02/26/2000, 05/06/2000, 01/20/2001   IPV 12/31/1999, 02/26/2000, 10/25/2000, 08/05/2004   Influenza,inj,Quad PF,6+ Mos 09/07/2014   Influenza-Unspecified 12/07/2012   MMR 10/25/2001, 08/05/2004   Meningococcal Polysaccharide 06/06/2012   Pneumococcal Conjugate-13 01/20/2001   Pneumococcal-Unspecified 05/06/2000, 07/20/2000   Tdap 06/06/2012   Varicella 10/25/2000, 06/01/2008    Health Maintenance  Topic Date Due   HIV Screening  Never done   Hepatitis C Screening  Never done   INFLUENZA VACCINE  07/28/2022   PAP-Cervical Cytology Screening  07/30/2022 (Originally 10/16/2020)   PAP SMEAR-Modifier  07/30/2022 (Originally 10/16/2020)   TETANUS/TDAP  07/30/2022 (Originally 06/06/2022)   HPV VACCINES  Discontinued   COVID-19 Vaccine  Discontinued    Discussed health benefits of physical activity, and encouraged her to engage in regular exercise appropriate for her age and condition.  Problem List Items Addressed This Visit   None Visit Diagnoses     Annual physical exam    -  Primary   Cervical cancer screening       Need for Tdap vaccination          No follow-ups on file.     Joy Jessup, NP   

## 2023-11-01 ENCOUNTER — Ambulatory Visit (AMBULATORY_SURGERY_CENTER): Payer: Medicare Other | Admitting: Gastroenterology

## 2023-11-01 ENCOUNTER — Encounter: Payer: Self-pay | Admitting: Gastroenterology

## 2023-11-01 VITALS — BP 134/87 | HR 78 | Temp 97.7°F | Resp 17 | Ht 64.0 in | Wt 216.0 lb

## 2023-11-01 DIAGNOSIS — Z860100 Personal history of colon polyps, unspecified: Secondary | ICD-10-CM

## 2023-11-01 DIAGNOSIS — Z1211 Encounter for screening for malignant neoplasm of colon: Secondary | ICD-10-CM | POA: Diagnosis not present

## 2023-11-01 DIAGNOSIS — Z860101 Personal history of adenomatous and serrated colon polyps: Secondary | ICD-10-CM | POA: Diagnosis not present

## 2023-11-01 DIAGNOSIS — Z09 Encounter for follow-up examination after completed treatment for conditions other than malignant neoplasm: Secondary | ICD-10-CM | POA: Diagnosis not present

## 2023-11-01 DIAGNOSIS — Z800 Family history of malignant neoplasm of digestive organs: Secondary | ICD-10-CM

## 2023-11-01 MED ORDER — SODIUM CHLORIDE 0.9 % IV SOLN: 500.0000 mL | Freq: Once | INTRAVENOUS | Status: DC

## 2023-11-01 NOTE — Op Note (Signed)
[facility] Patient Name: Jessah Danser Procedure Date: 11/01/2023 2:23 PM MRN: [id number] Endoscopist: Viviann Spare P. Adela Lank , MD, [id number] Age: 52 Referring MD:  Date of Birth: [birth year] Gender: Female Account #: [id number] Procedure:                Colonoscopy Indications:              High risk colon cancer surveillance: Personal                            history of colonic polyps - sessile serrated polyp                            removed 10/2017, father had colon cancer dx age 66s Medicines:                Monitored Anesthesia Care Procedure:                Pre-Anesthesia Assessment:                           - Prior to the procedure, a History and Physical                            was performed, and patient medications and                            allergies were reviewed. The patient's tolerance of                            previous anesthesia was also reviewed. The risks                            and benefits of the procedure and the sedation                            options and risks were discussed with the patient.                            All questions were answered, and informed consent                            was obtained. Prior Anticoagulants: The patient has                            taken no anticoagulant or antiplatelet agents. ASA                            Grade Assessment: II - A patient with mild systemic                            disease. After reviewing the risks and benefits,                            the patient was deemed in satisfactory condition to  undergo the procedure.                           After obtaining informed consent, the colonoscope                            was passed under direct vision. Throughout the                            procedure, the patient's blood pressure, pulse, and                            oxygen saturations were monitored continuously. The                             Olympus CF-HQ190L ([id number]) Colonoscope was                            introduced through the anus and advanced to the the                            cecum, identified by appendiceal orifice and                            ileocecal valve. The colonoscopy was performed                            without difficulty. The patient tolerated the                            procedure well. The quality of the bowel                            preparation was good. The ileocecal valve,                            appendiceal orifice, and rectum were photographed. Scope In: 2:28:18 PM Scope Out: 2:45:43 PM Scope Withdrawal Time: 0 hours 14 minutes 15 seconds  Total Procedure Duration: 0 hours 17 minutes 25 seconds  Findings:                 The perianal and digital rectal examinations were                            normal.                           Scattered small-mouthed diverticula were found in                            the entire colon.                           Internal hemorrhoids were found during retroflexion.  The exam was otherwise without abnormality. Complications:            No immediate complications. Estimated blood loss:                            None. Estimated Blood Loss:     Estimated blood loss: none. Impression:               - Diverticulosis in the entire examined colon.                           - Internal hemorrhoids.                           - The examination was otherwise normal.                           - No polyps Recommendation:           - Patient has a contact number available for                            emergencies. The signs and symptoms of potential                            delayed complications were discussed with the                            patient. Return to normal activities tomorrow.                            Written discharge instructions were provided to the                            patient.                            - Resume previous diet.                           - Continue present medications.                           - Repeat colonoscopy in 5 years given strong family                            history of colon cancer (father diagnosed < age 60) Willaim Rayas. Smith Potenza, MD 11/01/2023 2:49:35 PM This report has been signed electronically.

## 2023-11-01 NOTE — Progress Notes (Signed)
Pt's states no medical or surgical changes since previsit or office visit. 

## 2023-11-01 NOTE — Progress Notes (Signed)
[facility] History and Physical   Primary Care Physician:  Gilmore Laroche, FNP   Reason for Procedure:   History of colon polyps  Plan:    colonoscopy     HPI: Sabrina Roberts is a 52 y.o. female  here for colonoscopy surveillance - last exam 10/2017 - 1 sessile serrated polyp removed.   Patient denies any bowel symptoms at this time. Father with a history of colon cancer dx age 28s.   Otherwise feels well without any cardiopulmonary symptoms.   I have discussed risks / benefits of anesthesia and endoscopic procedure with Mertha Finders and they wish to proceed with the exams as outlined today.    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   Sleep apnea    Has CPAP but has lost weight and hasnt used   Wears contact lenses     Past Surgical History:  Procedure Laterality Date   CARPAL TUNNEL RELEASE Right 10/01/2016   Procedure: RIGHT CARPAL TUNNEL RELEASE;  Surgeon: Vickki Hearing, MD;  Location: [facility];  Service: Orthopedics;  Laterality: Right;   CARPAL TUNNEL RELEASE Left 10/22/2016   Procedure: CARPAL TUNNEL RELEASE;  Surgeon: Vickki Hearing, MD;  Location: [facility];  Service: Orthopedics;  Laterality: Left;   CESAREAN SECTION  2002   CHOLECYSTECTOMY  dec 2012   GASTRIC BYPASS  dec 2012   KNEE ARTHROSCOPY Right    METATARSAL OSTEOTOMY Left 05/14/2023   Procedure: METATARSAL OSTEOTOMY;  Surgeon: Kieth Brightly, DPM;  Location: [facility];  Service: Podiatry;  Laterality: Left;  POP BLOCK    Prior to Admission medications   Medication Sig Start Date End Date Taking? Authorizing Provider  atorvastatin (LIPITOR) 10 MG tablet Take 1 tablet  (10 mg total) by mouth daily. 01/04/23  Yes Gilmore Laroche, FNP  Blood Glucose Monitoring Suppl (BLOOD GLUCOSE SYSTEM PAK) KIT Please dispense based on patient and insurance preference.  Use as directed to monitor FSBS 1-2 times daily.  Dx E11.9 06/15/23  Yes Gilmore Laroche, FNP  DULoxetine (CYMBALTA) 60 MG capsule Take 2 capsules (120 mg total) by mouth daily. 06/16/23  Yes Elsie Lincoln, MD  FEROSUL 325 (65 Fe) MG tablet Take 1 tablet by mouth once daily with breakfast 06/07/23  Yes Gilmore Laroche, FNP  glucose blood test strip Please dispense based on patient and insurance preference.  Use as directed to monitor FSBS 1-2 times daily.  Dx E11.9 05/01/21  Yes Valentino Nose, NP  ketoconazole (NIZORAL) 2 % cream Apply topically 2 (two) times daily. 08/04/23  Yes [provider]  Lancets MISC Please dispense based on patient and insurance preference.  Use as directed to monitor FSBS 1-2 times daily.  Dx E11.9 05/01/21  Yes Cathlean Marseilles A, NP  cyanocobalamin (VITAMIN B12) 1000 MCG/ML injection 1 mL Injection inject 1 im once a week for 30 days 04/29/23 11/24/23  [provider]  diazepam (VALIUM) 5 MG tablet Take 1 tablet (5 mg total) by mouth every 12 (twelve) hours as needed for anxiety. Patient not taking: Reported on 10/08/2023  02/14/22   Sloan Leiter, DO  Dulaglutide (TRULICITY) 3 MG/0.5ML SOPN Inject 3 mg as directed once a week. 10/06/23   Gilmore Laroche, FNP  folic acid (FOLVITE) 1 MG tablet Take 1 mg by mouth daily. 04/29/23   [provider]  folic acid (FOLVITE) 400 MCG tablet Take 400 mcg by mouth daily. Patient not taking: Reported on 10/08/2023    [provider]  HYDROcodone-acetaminophen (NORCO) 5-325 MG tablet Take 1 tablet by mouth every 6 (six) hours as needed for severe pain. 10/12/21   Arthor Captain, PA-C  LYRICA 150 MG capsule  06/13/20   [provider]  Multiple Vitamin (MULTIVITAMIN) tablet Take 1 tablet by mouth daily.      [provider]  QUEtiapine (SEROQUEL) 100 MG tablet TAKE 1 TABLET BY MOUTH AT BEDTIME 10/04/23   Elsie Lincoln, MD  QUEtiapine (SEROQUEL) 25 MG tablet Take 0.5 tablets (12.5 mg total) by mouth 2 (two) times daily as needed (panic attacks). 06/16/23   Elsie Lincoln, MD  rOPINIRole (REQUIP) 0.5 MG tablet Take 0.5-1.5 mg by mouth at bedtime. 10/20/21   [provider]  tiZANidine (ZANAFLEX) 4 MG tablet Take 4 mg by mouth at bedtime. 10/07/23 02/03/24  [provider]  Vitamin D, Ergocalciferol, (DRISDOL) 1.25 MG (50000 UNIT) CAPS capsule Take 1 capsule by mouth once a week 10/01/23   Gilmore Laroche, FNP    Current Outpatient Medications  Medication Sig Dispense Refill   atorvastatin (LIPITOR) 10 MG tablet Take 1 tablet (10 mg total) by mouth daily. 90 tablet 1   Blood Glucose Monitoring Suppl (BLOOD GLUCOSE SYSTEM PAK) KIT Please dispense based on patient and insurance preference.  Use as directed to monitor FSBS 1-2 times daily.  Dx E11.9 1 kit 1   DULoxetine (CYMBALTA) 60 MG capsule Take 2 capsules (120 mg total) by mouth daily. 60 capsule 2   FEROSUL 325 (65 Fe) MG tablet Take 1 tablet by mouth once daily with breakfast 30 tablet 0   glucose blood test strip Please dispense based on patient and insurance preference.  Use as directed to monitor FSBS 1-2 times daily.  Dx E11.9 100 each 11   ketoconazole (NIZORAL) 2 % cream Apply topically 2 (two) times daily.     Lancets MISC Please dispense based on patient and insurance preference.  Use as directed to monitor FSBS 1-2 times daily.  Dx E11.9 100 each 11   cyanocobalamin (VITAMIN B12) 1000 MCG/ML injection 1 mL Injection inject 1 im once a week for 30 days     diazepam (VALIUM) 5 MG tablet Take 1 tablet (5 mg total) by mouth every 12 (twelve) hours as needed for anxiety. (Patient not taking: Reported on 10/08/2023) 5 tablet 0   Dulaglutide (TRULICITY) 3 MG/0.5ML SOPN Inject 3 mg as directed once a week. 2 mL 2    folic acid (FOLVITE) 1 MG tablet Take 1 mg by mouth daily.     folic acid (FOLVITE) 400 MCG tablet Take 400 mcg by mouth daily. (Patient not taking: Reported on 10/08/2023)     HYDROcodone-acetaminophen (NORCO) 5-325 MG tablet Take 1 tablet by mouth every 6 (six) hours as needed for severe pain. 6 tablet 0   LYRICA 150 MG capsule      Multiple Vitamin (MULTIVITAMIN) tablet Take 1 tablet by mouth daily.      QUEtiapine (SEROQUEL) 100 MG tablet TAKE 1 TABLET BY MOUTH AT BEDTIME 30 tablet 0   QUEtiapine (SEROQUEL) 25 MG tablet  Take 0.5 tablets (12.5 mg total) by mouth 2 (two) times daily as needed (panic attacks). 60 tablet 2   rOPINIRole (REQUIP) 0.5 MG tablet Take 0.5-1.5 mg by mouth at bedtime.     tiZANidine (ZANAFLEX) 4 MG tablet Take 4 mg by mouth at bedtime.     Vitamin D, Ergocalciferol, (DRISDOL) 1.25 MG (50000 UNIT) CAPS capsule Take 1 capsule by mouth once a week 10 capsule 0   No current facility-administered medications for this visit.    Allergies as of 11/01/2023 - Review Complete 11/01/2023  Allergen Reaction Noted   Actos [pioglitazone hydrochloride] Anaphylaxis, Hives, and Other (See Comments) 08/25/2011   Latex Rash 10/01/2016    Family History  Problem Relation Age of Onset   Hypertension Mother    Depression Mother    Diabetes Mother    Irritable bowel syndrome Mother    Heart disease Mother    Stroke Mother    Stomach cancer Father    Hypertension Father    Hyperlipidemia Father    Prostate cancer Father    Colon polyps Father    Heart disease Father    Kidney Stones Father    Diverticulitis Father    Colon cancer Father    Breast cancer Maternal Aunt    Colon polyps Paternal Aunt        x 3   Stomach cancer Paternal Uncle    Diabetes Maternal Grandmother    Breast cancer Paternal Grandmother    Colon cancer Paternal Grandfather    Colon cancer Other        Paternal Great Uncles x 4   Esophageal cancer Neg Hx    Rectal cancer Neg Hx     Social  History   Socioeconomic History   Marital status: Widowed    Spouse name: Not on file   Number of children: Not on file   Years of education: Not on file   Highest education level: Not on file  Occupational History   Not on file  Tobacco Use   Smoking status: Never    Passive exposure: Current   Smokeless tobacco: Never  Vaping Use   Vaping status: Never Used  Substance and Sexual Activity   Alcohol use: Yes    Comment: 1 mixed drink every week or every month   Drug use: No   Sexual activity: Yes    Partners: Male    Birth control/protection: None  Other Topics Concern   Not on file  Social History Narrative   Not on file   Social Determinants of Health   Financial Resource Strain: Not on file  Food Insecurity: Not on file  Transportation Needs: Not on file  Physical Activity: Not on file  Stress: Not on file  Social Connections: Not on file  Intimate Partner Violence: Not on file    Review of Systems: All other review of systems negative except as mentioned in the HPI.  Physical Exam: Vital signs BP 124/88   Pulse 79   Temp 97.7 F (36.5 C)   Ht 5\' 4"  (1.626 m)   Wt 216 lb (98 kg)   LMP  (LMP Unknown)   SpO2 100%   BMI 37.08 kg/m   General:   Alert,  Well-developed, pleasant and cooperative in NAD Lungs:  Clear throughout to auscultation.   Heart:  Regular rate and rhythm Abdomen:  Soft, nontender and nondistended.   Neuro/Psych:  Alert and cooperative. Normal mood and affect. A and O x 3  Brett Canales  Adela Lank, MD [facility]

## 2023-11-01 NOTE — Progress Notes (Signed)
A/O x 3, gd SR's, VSS, report to RN

## 2023-11-01 NOTE — Patient Instructions (Addendum)
Resume previous diet Continue present medications There were no colon polyps seen today!   You will need another screening colonoscopy in 5 years, you will receive a letter at that time when you are due for the procedure.    Please call us at [phone] if you have a change in bowel habits, change in family history of colo-rectal cancer, rectal bleeding or other GI concern before that time.  Handouts/information given for diverticulosis and hemorrhoids  YOU HAD AN ENDOSCOPIC PROCEDURE TODAY AT THE [facility]:   Refer to the procedure report that was given to you for any specific questions about what was found during the examination.  If the procedure report does not answer your questions, please call your gastroenterologist to clarify.  If you requested that your care partner not be given the details of your procedure findings, then the procedure report has been included in a sealed envelope for you to review at your convenience later.  YOU SHOULD EXPECT: Some feelings of bloating in the abdomen. Passage of more gas than usual.  Walking can help get rid of the air that was put into your GI tract during the procedure and reduce the bloating. If you had a lower endoscopy (such as a colonoscopy or flexible sigmoidoscopy) you may notice spotting of blood in your stool or on the toilet paper. If you underwent a bowel prep for your procedure, you may not have a normal bowel movement for a few days.  Please Note:  You might notice some irritation and congestion in your nose or some drainage.  This is from the oxygen used during your procedure.  There is no need for concern and it should clear up in a day or so.  SYMPTOMS TO REPORT IMMEDIATELY:  Following lower endoscopy (colonoscopy):  Excessive amounts of blood in the stool  Significant tenderness or worsening of abdominal pains  Swelling of the abdomen that is new, acute  Fever of 100F or higher For urgent or emergent issues, a  gastroenterologist can be reached at any hour by calling [phone]. Do not use MyChart messaging for urgent concerns.   DIET:  We do recommend a small meal at first, but then you may proceed to your regular diet.  Drink plenty of fluids but you should avoid alcoholic beverages for 24 hours.  ACTIVITY:  You should plan to take it easy for the rest of today and you should NOT DRIVE or use heavy machinery until tomorrow (because of the sedation medicines used during the test).    FOLLOW UP: Our staff will call the number listed on your records the next business day following your procedure.  We will call around 7:15- 8:00 am to check on you and address any questions or concerns that you may have regarding the information given to you following your procedure. If we do not reach you, we will leave a message.     SIGNATURES/CONFIDENTIALITY: You and/or your care partner have signed paperwork which will be entered into your electronic medical record.  These signatures attest to the fact that that the information above on your After Visit Summary has been reviewed and is understood.  Full responsibility of the confidentiality of this discharge information lies with you and/or your care-partner.

## 2023-11-02 ENCOUNTER — Telehealth: Payer: Self-pay | Admitting: *Deleted

## 2023-11-02 NOTE — Telephone Encounter (Signed)
  Follow up Call-     11/01/2023    1:58 PM  Call back number  Post procedure Call Back phone  # [phone]  Permission to leave phone message Yes     Patient questions:  Do you have a fever, pain , or abdominal swelling? No. Pain Score  0 *  Have you tolerated food without any problems? Yes.    Have you been able to return to your normal activities? Yes.    Do you have any questions about your discharge instructions: Diet   No. Medications  No. Follow up visit  No.  Do you have questions or concerns about your Care? No.  Actions: * If pain score is 4 or above: No action needed, pain <4.

## 2023-11-04 ENCOUNTER — Encounter: Payer: Self-pay | Admitting: Family Medicine

## 2023-11-04 ENCOUNTER — Ambulatory Visit (INDEPENDENT_AMBULATORY_CARE_PROVIDER_SITE_OTHER): Payer: Medicare Other | Admitting: Family Medicine

## 2023-11-04 VITALS — BP 103/70 | HR 94 | Ht 64.0 in | Wt 218.1 lb

## 2023-11-04 DIAGNOSIS — Z01818 Encounter for other preprocedural examination: Secondary | ICD-10-CM

## 2023-11-04 DIAGNOSIS — E7849 Other hyperlipidemia: Secondary | ICD-10-CM

## 2023-11-04 NOTE — Patient Instructions (Addendum)
I appreciate the opportunity to provide care to you today!   Please stop by tomorrow for fasting labs and your urinalysis.    Please continue to a heart-healthy diet and increase your physical activities. Try to exercise for at least five days a week.    It was a pleasure to see you and I look forward to continuing to work together on your health and well-being. Please do not hesitate to call the office if you need care or have questions about your care.  In case of emergency, please visit the Emergency Department for urgent care, or contact our clinic at [phone] to schedule an appointment. We're here to help you!   Have a wonderful day and week. With Gratitude, Gilmore Laroche MSN, FNP-BC

## 2023-11-04 NOTE — Progress Notes (Signed)
Established Patient Office Visit  Subjective:  Patient ID: Sabrina Roberts, female    DOB: 1971/08/29  Age: 52 y.o. MRN: [id number]  CC:  Chief Complaint  Patient presents with   Pre-op Exam    Pt has a surgery coming up on 11/15 needs labs done as well.     HPI Sabrina Roberts is a 52 y.o. female presents for for preop clearance.  The patient will be having a left taylor bunionectomy on 11/12/2023.  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   Sleep apnea    Has CPAP but has lost weight and hasnt used   Wears contact lenses     Past Surgical History:  Procedure Laterality Date   CARPAL TUNNEL RELEASE Right 10/01/2016   Procedure: RIGHT CARPAL TUNNEL RELEASE;  Surgeon: Vickki Hearing, MD;  Location: [facility];  Service: Orthopedics;  Laterality: Right;   CARPAL TUNNEL RELEASE Left 10/22/2016   Procedure: CARPAL TUNNEL RELEASE;  Surgeon: Vickki Hearing, MD;  Location: [facility];  Service: Orthopedics;  Laterality: Left;   CESAREAN SECTION  2002   CHOLECYSTECTOMY  dec 2012   GASTRIC BYPASS  dec 2012   KNEE ARTHROSCOPY Right    METATARSAL OSTEOTOMY Left 05/14/2023   Procedure: METATARSAL OSTEOTOMY;  Surgeon: Kieth Brightly, DPM;  Location: [facility];  Service: Podiatry;  Laterality: Left;  POP BLOCK    Family History  Problem Relation Age of Onset   Hypertension Mother    Depression Mother    Diabetes Mother    Irritable bowel syndrome Mother    Heart disease Mother    Stroke Mother    Stomach cancer Father    Hypertension Father    Hyperlipidemia Father    Prostate cancer Father    Colon polyps Father    Heart disease Father    Kidney Stones Father     Diverticulitis Father    Colon cancer Father    Breast cancer Maternal Aunt    Colon polyps Paternal Aunt        x 3   Stomach cancer Paternal Uncle    Diabetes Maternal Grandmother    Breast cancer Paternal Grandmother    Colon cancer Paternal Grandfather    Colon cancer Other        Paternal Great Uncles x 4   Esophageal cancer Neg Hx    Rectal cancer Neg Hx     Social History   Socioeconomic History   Marital status: Widowed    Spouse name: Not on file   Number of children: Not on file   Years of education: Not on file   Highest education level: Not on file  Occupational History   Not on file  Tobacco Use   Smoking status: Never    Passive exposure: Current   Smokeless tobacco: Never  Vaping Use   Vaping status: Never Used  Substance and Sexual Activity   Alcohol use: Yes    Comment: 1 mixed drink every week or every month   Drug use: No   Sexual activity: Yes  Partners: Male    Birth control/protection: None  Other Topics Concern   Not on file  Social History Narrative   Not on file   Social Determinants of Health   Financial Resource Strain: Not on file  Food Insecurity: Not on file  Transportation Needs: Not on file  Physical Activity: Not on file  Stress: Not on file  Social Connections: Not on file  Intimate Partner Violence: Not on file    Outpatient Medications Prior to Visit  Medication Sig Dispense Refill   atorvastatin (LIPITOR) 10 MG tablet Take 1 tablet (10 mg total) by mouth daily. 90 tablet 1   Blood Glucose Monitoring Suppl (BLOOD GLUCOSE SYSTEM PAK) KIT Please dispense based on patient and insurance preference.  Use as directed to monitor FSBS 1-2 times daily.  Dx E11.9 1 kit 1   cyanocobalamin (VITAMIN B12) 1000 MCG/ML injection 1 mL Injection inject 1 im once a week for 30 days     diazepam (VALIUM) 5 MG tablet Take 1 tablet (5 mg total) by mouth every 12 (twelve) hours as needed for anxiety. 5 tablet 0   Dulaglutide (TRULICITY) 3  MG/0.5ML SOPN Inject 3 mg as directed once a week. 2 mL 2   DULoxetine (CYMBALTA) 60 MG capsule Take 2 capsules (120 mg total) by mouth daily. 60 capsule 2   FEROSUL 325 (65 Fe) MG tablet Take 1 tablet by mouth once daily with breakfast 30 tablet 0   folic acid (FOLVITE) 1 MG tablet Take 1 mg by mouth daily.     folic acid (FOLVITE) 400 MCG tablet Take 400 mcg by mouth daily.     glucose blood test strip Please dispense based on patient and insurance preference.  Use as directed to monitor FSBS 1-2 times daily.  Dx E11.9 100 each 11   HYDROcodone-acetaminophen (NORCO) 5-325 MG tablet Take 1 tablet by mouth every 6 (six) hours as needed for severe pain. 6 tablet 0   ketoconazole (NIZORAL) 2 % cream Apply topically 2 (two) times daily.     Lancets MISC Please dispense based on patient and insurance preference.  Use as directed to monitor FSBS 1-2 times daily.  Dx E11.9 100 each 11   LYRICA 150 MG capsule      Multiple Vitamin (MULTIVITAMIN) tablet Take 1 tablet by mouth daily.      QUEtiapine (SEROQUEL) 100 MG tablet TAKE 1 TABLET BY MOUTH AT BEDTIME 30 tablet 0   QUEtiapine (SEROQUEL) 25 MG tablet Take 0.5 tablets (12.5 mg total) by mouth 2 (two) times daily as needed (panic attacks). 60 tablet 2   rOPINIRole (REQUIP) 0.5 MG tablet Take 0.5-1.5 mg by mouth at bedtime.     tiZANidine (ZANAFLEX) 4 MG tablet Take 4 mg by mouth at bedtime.     Vitamin D, Ergocalciferol, (DRISDOL) 1.25 MG (50000 UNIT) CAPS capsule Take 1 capsule by mouth once a week 10 capsule 0   No facility-administered medications prior to visit.    Allergies  Allergen Reactions   Actos [Pioglitazone Hydrochloride] Anaphylaxis, Hives and Other (See Comments)    Chest pain    Latex Rash    ROS Review of Systems  Constitutional:  Negative for chills and fever.  Eyes:  Negative for visual disturbance.  Respiratory:  Negative for chest tightness and shortness of breath.   Neurological:  Negative for dizziness and headaches.       Objective:    Physical Exam HENT:     Head: Normocephalic.  Mouth/Throat:     Mouth: Mucous membranes are moist.  Cardiovascular:     Rate and Rhythm: Normal rate.     Heart sounds: Normal heart sounds.  Pulmonary:     Effort: Pulmonary effort is normal.     Breath sounds: Normal breath sounds.  Neurological:     Mental Status: She is alert.     BP 103/70   Pulse 94   Ht 5\' 4"  (1.626 m)   Wt 218 lb 1.3 oz (98.9 kg)   LMP  (LMP Unknown)   SpO2 95%   BMI 37.43 kg/m  Wt Readings from Last 3 Encounters:  11/04/23 218 lb 1.3 oz (98.9 kg)  11/01/23 216 lb (98 kg)  10/08/23 216 lb (98 kg)    Lab Results  Component Value Date   TSH 0.972 01/13/2023   Lab Results  Component Value Date   WBC 6.5 05/12/2023   HGB 11.6 05/12/2023   HCT 35.6 05/12/2023   MCV 99 (H) 05/12/2023   PLT 308 05/12/2023   Lab Results  Component Value Date   NA 141 05/12/2023   K 4.9 05/12/2023   CO2 18 (L) 05/12/2023   GLUCOSE 107 (H) 05/12/2023   BUN 18 05/12/2023   CREATININE 0.99 05/12/2023   BILITOT 0.5 01/13/2023   ALKPHOS 142 (H) 01/13/2023   AST 18 01/13/2023   ALT 13 01/13/2023   PROT 6.7 01/13/2023   ALBUMIN 4.2 01/13/2023   CALCIUM 9.1 05/12/2023   ANIONGAP 8 02/14/2022   EGFR 69 05/12/2023   Lab Results  Component Value Date   CHOL 169 01/13/2023   Lab Results  Component Value Date   HDL 73 01/13/2023   Lab Results  Component Value Date   LDLCALC 79 01/13/2023   Lab Results  Component Value Date   TRIG 94 01/13/2023   Lab Results  Component Value Date   CHOLHDL 2.3 01/13/2023   Lab Results  Component Value Date   HGBA1C 6.0 (H) 05/12/2023      Assessment & Plan:  Preoperative clearance Assessment & Plan: EKG completed Planning CBC, BMP, hemoglobin A1c, and urinalysis   Orders: -     EKG 12-Lead -     Urinalysis  Other hyperlipidemia  Note: This chart has been completed using Engineer, civil (consulting) software, and while attempts  have been made to ensure accuracy, certain words and phrases may not be transcribed as intended.    Follow-up: No follow-ups on file.   Gilmore Laroche, FNP

## 2023-11-04 NOTE — Assessment & Plan Note (Signed)
EKG completed Planning CBC, BMP, hemoglobin A1c, and urinalysis

## 2023-11-05 ENCOUNTER — Telehealth: Payer: Self-pay | Admitting: Family Medicine

## 2023-11-05 ENCOUNTER — Encounter (HOSPITAL_COMMUNITY): Payer: Self-pay

## 2023-11-05 ENCOUNTER — Encounter (HOSPITAL_BASED_OUTPATIENT_CLINIC_OR_DEPARTMENT_OTHER): Payer: Self-pay | Admitting: Podiatry

## 2023-11-05 ENCOUNTER — Other Ambulatory Visit: Payer: Self-pay

## 2023-11-05 NOTE — Progress Notes (Signed)
Spoke w/ via phone for pre-op interview: patient  Lab needs dos:  Lab results: CMP 11/04/23- results pending; EKG 11/04/23 COVID test: patient states asymptomatic no test needed. Arrive at 1030 11/12/23 NPO after MN except clear liquids. Clear liquids from MN until 0930 Med rec completed. Medications to take morning of surgery: Cymbalta; Valium if needed Diabetic medication: holding Trulicity 1 week prior to sx Patient instructed no nail polish to be worn day of surgery. Patient instructed to bring photo id and insurance card day of surgery. Patient aware to have driver (ride ) / caregiver for 24 hours after surgery. Daughter, Karel Jarvis, to drive. Special Instructions: awaiting pre-operative clearance from PCP Patient verbalized understanding of instructions that were given at this phone interview. Patient denies shortness of breath, chest pain, fever, cough at this phone interview.

## 2023-11-05 NOTE — Telephone Encounter (Signed)
Surgical clearance   Noted  Copied Sleeved  Original in PCP box Copy front desk folder

## 2023-11-06 LAB — HEMOGLOBIN A1C
Est. average glucose Bld gHb Est-mCnc: 131 mg/dL
Hgb A1c MFr Bld: 6.2 % — ABNORMAL HIGH (ref 4.8–5.6)

## 2023-11-06 LAB — CBC WITH DIFFERENTIAL/PLATELET
Basophils Absolute: 0 10*3/uL (ref 0.0–0.2)
Basos: 1 %
EOS (ABSOLUTE): 0.1 10*3/uL (ref 0.0–0.4)
Eos: 2 %
Hematocrit: 35.1 % (ref 34.0–46.6)
Hemoglobin: 11 g/dL — ABNORMAL LOW (ref 11.1–15.9)
Immature Grans (Abs): 0 10*3/uL (ref 0.0–0.1)
Immature Granulocytes: 0 %
Lymphocytes Absolute: 2.2 10*3/uL (ref 0.7–3.1)
Lymphs: 46 %
MCH: 31 pg (ref 26.6–33.0)
MCHC: 31.3 g/dL — ABNORMAL LOW (ref 31.5–35.7)
MCV: 99 fL — ABNORMAL HIGH (ref 79–97)
Monocytes Absolute: 0.5 10*3/uL (ref 0.1–0.9)
Monocytes: 10 %
Neutrophils Absolute: 2 10*3/uL (ref 1.4–7.0)
Neutrophils: 41 %
Platelets: 309 10*3/uL (ref 150–450)
RBC: 3.55 x10E6/uL — ABNORMAL LOW (ref 3.77–5.28)
RDW: 12.9 % (ref 11.7–15.4)
WBC: 4.8 10*3/uL (ref 3.4–10.8)

## 2023-11-06 LAB — CMP14+EGFR
ALT: 9 [IU]/L (ref 0–32)
AST: 16 [IU]/L (ref 0–40)
Albumin: 3.9 g/dL (ref 3.8–4.9)
Alkaline Phosphatase: 143 [IU]/L — ABNORMAL HIGH (ref 44–121)
BUN/Creatinine Ratio: 15 (ref 9–23)
BUN: 13 mg/dL (ref 6–24)
Bilirubin Total: 0.5 mg/dL (ref 0.0–1.2)
CO2: 20 mmol/L (ref 20–29)
Calcium: 9.1 mg/dL (ref 8.7–10.2)
Chloride: 108 mmol/L — ABNORMAL HIGH (ref 96–106)
Creatinine, Ser: 0.87 mg/dL (ref 0.57–1.00)
Globulin, Total: 2.7 g/dL (ref 1.5–4.5)
Glucose: 81 mg/dL (ref 70–99)
Potassium: 4.4 mmol/L (ref 3.5–5.2)
Sodium: 141 mmol/L (ref 134–144)
Total Protein: 6.6 g/dL (ref 6.0–8.5)
eGFR: 80 mL/min/{1.73_m2} (ref >59)

## 2023-11-06 LAB — LIPID PANEL
Chol/HDL Ratio: 2.9 {ratio} (ref 0.0–4.4)
Cholesterol, Total: 167 mg/dL (ref 100–199)
HDL: 57 mg/dL (ref >39)
LDL Chol Calc (NIH): 94 mg/dL (ref 0–99)
Triglycerides: 83 mg/dL (ref 0–149)
VLDL Cholesterol Cal: 16 mg/dL (ref 5–40)

## 2023-11-06 LAB — TSH+FREE T4
Free T4: 1.01 ng/dL (ref 0.82–1.77)
TSH: 0.276 u[IU]/mL — ABNORMAL LOW (ref 0.450–4.500)

## 2023-11-06 LAB — VITAMIN D 25 HYDROXY (VIT D DEFICIENCY, FRACTURES): Vit D, 25-Hydroxy: 28.2 ng/mL — ABNORMAL LOW (ref 30.0–100.0)

## 2023-11-07 ENCOUNTER — Other Ambulatory Visit: Payer: Self-pay | Admitting: Family Medicine

## 2023-11-07 DIAGNOSIS — E559 Vitamin D deficiency, unspecified: Secondary | ICD-10-CM

## 2023-11-07 DIAGNOSIS — R7989 Other specified abnormal findings of blood chemistry: Secondary | ICD-10-CM

## 2023-11-07 MED ORDER — VITAMIN D (ERGOCALCIFEROL) 1.25 MG (50000 UNIT) PO CAPS: 50000.0000 [IU] | ORAL_CAPSULE | ORAL | 1 refills | Status: DC

## 2023-11-08 NOTE — Progress Notes (Signed)
Cbc with dif 11-04-2023 epic Cmp 11-04-2023 epic EKG 11-04-2023 epic

## 2023-11-12 ENCOUNTER — Ambulatory Visit (HOSPITAL_BASED_OUTPATIENT_CLINIC_OR_DEPARTMENT_OTHER): Payer: Medicare Other | Admitting: Anesthesiology

## 2023-11-12 ENCOUNTER — Encounter (HOSPITAL_BASED_OUTPATIENT_CLINIC_OR_DEPARTMENT_OTHER)
Admission: RE | Disposition: A | Discharge status: Home / Self Care | Payer: Self-pay | Source: Home / Self Care | Attending: Podiatry

## 2023-11-12 ENCOUNTER — Other Ambulatory Visit: Payer: Self-pay

## 2023-11-12 ENCOUNTER — Encounter (HOSPITAL_BASED_OUTPATIENT_CLINIC_OR_DEPARTMENT_OTHER): Payer: Self-pay | Admitting: Podiatry

## 2023-11-12 ENCOUNTER — Ambulatory Visit (HOSPITAL_BASED_OUTPATIENT_CLINIC_OR_DEPARTMENT_OTHER)
Admission: RE | Admit: 2023-11-12 | Discharge: 2023-11-12 | Disposition: A | Discharge status: Home / Self Care | Payer: Medicare Other | Attending: Podiatry | Admitting: Podiatry

## 2023-11-12 DIAGNOSIS — Z01818 Encounter for other preprocedural examination: Secondary | ICD-10-CM

## 2023-11-12 DIAGNOSIS — M21612 Bunion of left foot: Secondary | ICD-10-CM | POA: Diagnosis not present

## 2023-11-12 DIAGNOSIS — I10 Essential (primary) hypertension: Secondary | ICD-10-CM

## 2023-11-12 DIAGNOSIS — M21611 Bunion of right foot: Secondary | ICD-10-CM | POA: Insufficient documentation

## 2023-11-12 HISTORY — PX: BUNIONECTOMY: SHX129

## 2023-11-12 LAB — GLUCOSE, CAPILLARY
Glucose-Capillary: 78 mg/dL (ref 70–99)
Glucose-Capillary: 92 mg/dL (ref 70–99)

## 2023-11-12 SURGERY — BUNIONECTOMY
Anesthesia: Monitor Anesthesia Care | Site: Toe | Laterality: Right

## 2023-11-12 MED ORDER — MIDAZOLAM HCL 2 MG/2ML IJ SOLN
INTRAMUSCULAR | Status: DC | PRN
  Administered 2023-11-12: 2 mg via INTRAVENOUS

## 2023-11-12 MED ORDER — PROPOFOL 10 MG/ML IV BOLUS
INTRAVENOUS | Status: AC
  Filled 2023-11-12: qty 20

## 2023-11-12 MED ORDER — CEFAZOLIN SODIUM-DEXTROSE 2-4 GM/100ML-% IV SOLN
INTRAVENOUS | Status: AC
  Filled 2023-11-12: qty 100

## 2023-11-12 MED ORDER — EPHEDRINE 5 MG/ML INJ
INTRAVENOUS | Status: AC
  Filled 2023-11-12: qty 10

## 2023-11-12 MED ORDER — PROPOFOL 500 MG/50ML IV EMUL
INTRAVENOUS | Status: DC | PRN
  Administered 2023-11-12: 100 ug/kg/min via INTRAVENOUS

## 2023-11-12 MED ORDER — OXYCODONE HCL 5 MG PO TABS
5.0000 mg | ORAL_TABLET | Freq: Once | ORAL | Status: AC | PRN
  Administered 2023-11-12: 5 mg via ORAL

## 2023-11-12 MED ORDER — DEXMEDETOMIDINE HCL IN NACL 80 MCG/20ML IV SOLN
INTRAVENOUS | Status: DC | PRN
  Administered 2023-11-12 (×2): 4 ug via INTRAVENOUS

## 2023-11-12 MED ORDER — CEFAZOLIN SODIUM-DEXTROSE 2-4 GM/100ML-% IV SOLN
2.0000 g | INTRAVENOUS | Status: AC
  Administered 2023-11-12: 2 g via INTRAVENOUS

## 2023-11-12 MED ORDER — PHENYLEPHRINE HCL (PRESSORS) 10 MG/ML IV SOLN
INTRAVENOUS | Status: DC | PRN
  Administered 2023-11-12: 120 ug via INTRAVENOUS

## 2023-11-12 MED ORDER — CHLORHEXIDINE GLUCONATE CLOTH 2 % EX PADS: 6.0000 | MEDICATED_PAD | Freq: Once | CUTANEOUS | Status: DC

## 2023-11-12 MED ORDER — ONDANSETRON HCL 4 MG/2ML IJ SOLN
INTRAMUSCULAR | Status: AC
  Filled 2023-11-12: qty 6

## 2023-11-12 MED ORDER — AMISULPRIDE (ANTIEMETIC) 5 MG/2ML IV SOLN: 10.0000 mg | Freq: Once | INTRAVENOUS | Status: DC | PRN

## 2023-11-12 MED ORDER — MIDAZOLAM HCL 2 MG/2ML IJ SOLN
INTRAMUSCULAR | Status: AC
Start: 2023-11-12 — End: ?
  Filled 2023-11-12: qty 2

## 2023-11-12 MED ORDER — FENTANYL CITRATE (PF) 100 MCG/2ML IJ SOLN
INTRAMUSCULAR | Status: DC | PRN
  Administered 2023-11-12 (×2): 25 ug via INTRAVENOUS
  Administered 2023-11-12: 50 ug via INTRAVENOUS

## 2023-11-12 MED ORDER — ACETAMINOPHEN 500 MG PO TABS
1000.0000 mg | ORAL_TABLET | Freq: Once | ORAL | Status: AC
  Administered 2023-11-12: 1000 mg via ORAL

## 2023-11-12 MED ORDER — SODIUM CHLORIDE 0.9 % IV SOLN: 12.5000 mg | INTRAVENOUS | Status: DC | PRN

## 2023-11-12 MED ORDER — OXYCODONE HCL 5 MG PO TABS
ORAL_TABLET | ORAL | Status: AC
  Filled 2023-11-12: qty 1

## 2023-11-12 MED ORDER — SODIUM CHLORIDE 0.9 % IV SOLN: INTRAVENOUS | Status: DC

## 2023-11-12 MED ORDER — LIDOCAINE HCL (CARDIAC) PF 100 MG/5ML IV SOSY
PREFILLED_SYRINGE | INTRAVENOUS | Status: DC | PRN
  Administered 2023-11-12: 40 mg via INTRAVENOUS

## 2023-11-12 MED ORDER — ACETAMINOPHEN 500 MG PO TABS
ORAL_TABLET | ORAL | Status: AC
  Filled 2023-11-12: qty 2

## 2023-11-12 MED ORDER — CELECOXIB 200 MG PO CAPS
200.0000 mg | ORAL_CAPSULE | Freq: Once | ORAL | Status: AC
  Administered 2023-11-12: 200 mg via ORAL

## 2023-11-12 MED ORDER — CELECOXIB 200 MG PO CAPS
ORAL_CAPSULE | ORAL | Status: AC
  Filled 2023-11-12: qty 1

## 2023-11-12 MED ORDER — FENTANYL CITRATE (PF) 100 MCG/2ML IJ SOLN
INTRAMUSCULAR | Status: AC
  Filled 2023-11-12: qty 2

## 2023-11-12 MED ORDER — PROPOFOL 1000 MG/100ML IV EMUL
INTRAVENOUS | Status: AC
  Filled 2023-11-12: qty 100

## 2023-11-12 MED ORDER — DEXAMETHASONE SODIUM PHOSPHATE 10 MG/ML IJ SOLN
INTRAMUSCULAR | Status: DC | PRN
  Administered 2023-11-12: 5 mg via INTRAVENOUS

## 2023-11-12 MED ORDER — BUPIVACAINE HCL 0.5 % IJ SOLN
INTRAMUSCULAR | Status: DC | PRN
  Administered 2023-11-12: 20 mL

## 2023-11-12 MED ORDER — LIDOCAINE HCL (PF) 2 % IJ SOLN
INTRAMUSCULAR | Status: AC
  Filled 2023-11-12: qty 10

## 2023-11-12 MED ORDER — FENTANYL CITRATE (PF) 100 MCG/2ML IJ SOLN
25.0000 ug | INTRAMUSCULAR | Status: DC | PRN
  Administered 2023-11-12: 25 ug via INTRAVENOUS

## 2023-11-12 MED ORDER — ONDANSETRON HCL 4 MG/2ML IJ SOLN
INTRAMUSCULAR | Status: DC | PRN
  Administered 2023-11-12: 4 mg via INTRAVENOUS

## 2023-11-12 MED ORDER — DEXAMETHASONE SODIUM PHOSPHATE 10 MG/ML IJ SOLN
INTRAMUSCULAR | Status: AC
  Filled 2023-11-12: qty 2

## 2023-11-12 MED ORDER — OXYCODONE HCL 5 MG/5ML PO SOLN: 5.0000 mg | Freq: Once | ORAL | Status: AC | PRN

## 2023-11-12 MED ORDER — LACTATED RINGERS IV SOLN
INTRAVENOUS | Status: DC
Start: 2023-11-12 — End: 2023-11-12

## 2023-11-12 MED ORDER — PHENYLEPHRINE 80 MCG/ML (10ML) SYRINGE FOR IV PUSH (FOR BLOOD PRESSURE SUPPORT)
PREFILLED_SYRINGE | INTRAVENOUS | Status: AC
  Filled 2023-11-12: qty 40

## 2023-11-12 SURGICAL SUPPLY — 57 items
BLADE AVERAGE 25X9 (BLADE) IMPLANT
BLADE MINI RND TIP GREEN BEAV (BLADE) IMPLANT
BLADE OSC/SAG .038X5.5 CUT EDG (BLADE) IMPLANT
BLADE OSCILLATING/SAGITTAL (BLADE) ×1
BLADE SURG 15 STRL LF DISP TIS (BLADE) ×1 IMPLANT
BLADE SURG 15 STRL SS (BLADE) ×1
BLADE SW THK.38XMED LNG THN (BLADE) ×1 IMPLANT
BNDG ELASTIC 3INX 5YD STR LF (GAUZE/BANDAGES/DRESSINGS) ×1 IMPLANT
BNDG ELASTIC 4INX 5YD STR LF (GAUZE/BANDAGES/DRESSINGS) ×1 IMPLANT
BNDG ESMARK 4X9 LF (GAUZE/BANDAGES/DRESSINGS) ×1 IMPLANT
BNDG GAUZE DERMACEA FLUFF 4 (GAUZE/BANDAGES/DRESSINGS) ×1 IMPLANT
CHLORAPREP W/TINT 26 (MISCELLANEOUS) ×1 IMPLANT
COVER BACK TABLE 60X90IN (DRAPES) ×1 IMPLANT
CUFF TOURN SGL QUICK 18X4 (TOURNIQUET CUFF) IMPLANT
CUFF TOURN SGL QUICK 24 (TOURNIQUET CUFF)
CUFF TRNQT CYL 24X4X16.5-23 (TOURNIQUET CUFF) IMPLANT
DRAPE 3/4 80X56 (DRAPES) ×1 IMPLANT
DRAPE C-ARM 35X43 STRL (DRAPES) IMPLANT
DRAPE EXTREMITY T 121X128X90 (DISPOSABLE) ×1 IMPLANT
DRAPE U-SHAPE 47X51 STRL (DRAPES) ×1 IMPLANT
ELECT REM PT RETURN 9FT ADLT (ELECTROSURGICAL) ×1
ELECTRODE REM PT RTRN 9FT ADLT (ELECTROSURGICAL) ×1 IMPLANT
GAUZE 4X4 16PLY ~~LOC~~+RFID DBL (SPONGE) ×1 IMPLANT
GAUZE SPONGE 4X4 12PLY STRL (GAUZE/BANDAGES/DRESSINGS) ×1 IMPLANT
GAUZE STRETCH 2X75IN STRL (MISCELLANEOUS) IMPLANT
GAUZE XEROFORM 1X8 LF (GAUZE/BANDAGES/DRESSINGS) ×1 IMPLANT
GLOVE BIO SURGEON STRL SZ7.5 (GLOVE) ×1 IMPLANT
GLOVE BIO SURGEON STRL SZ8 (GLOVE) ×1 IMPLANT
GLOVE BIOGEL PI IND STRL 8 (GLOVE) ×1 IMPLANT
GOWN STRL REUS W/TWL 2XL LVL3 (GOWN DISPOSABLE) ×1 IMPLANT
K-WIRE SURGICAL 1.6X102 (WIRE) IMPLANT
KIT TURNOVER CYSTO (KITS) ×1 IMPLANT
NDL HYPO 25X1 1.5 SAFETY (NEEDLE) ×1 IMPLANT
NEEDLE HYPO 25X1 1.5 SAFETY (NEEDLE) ×1
NS IRRIG 500ML POUR BTL (IV SOLUTION) ×1 IMPLANT
PACK BASIN DAY SURGERY FS (CUSTOM PROCEDURE TRAY) ×1 IMPLANT
PENCIL SMOKE EVACUATOR (MISCELLANEOUS) ×1 IMPLANT
PIN GUARD 1.57MM GREEN STERILE (PIN) IMPLANT
SLEEVE SCD COMPRESS KNEE MED (STOCKING) ×1 IMPLANT
STAPLER SKIN PROX WIDE 3.9 (STAPLE) IMPLANT
STOCKINETTE 6 STRL (DRAPES) ×1 IMPLANT
SUCTION TUBE FRAZIER 10FR DISP (SUCTIONS) ×1 IMPLANT
SUT ETHILON 4 0 PS 2 18 (SUTURE) IMPLANT
SUT MNCRL AB 3-0 PS2 27 (SUTURE) IMPLANT
SUT MNCRL AB 4-0 PS2 18 (SUTURE) ×1 IMPLANT
SUT MON AB 5-0 PS2 18 (SUTURE) IMPLANT
SUT VIC AB 2-0 PS2 27 (SUTURE) IMPLANT
SUT VIC AB 2-0 SH 27 (SUTURE)
SUT VIC AB 2-0 SH 27XBRD (SUTURE) IMPLANT
SUT VIC AB 3-0 FS2 27 (SUTURE) IMPLANT
SUT VIC AB 4-0 PS2 27 (SUTURE) IMPLANT
SYR BULB EAR ULCER 3OZ GRN STR (SYRINGE) ×1 IMPLANT
SYR CONTROL 10ML LL (SYRINGE) ×1 IMPLANT
TOWEL OR 17X24 6PK STRL BLUE (TOWEL DISPOSABLE) ×1 IMPLANT
TRAY DSU PREP LF (CUSTOM PROCEDURE TRAY) IMPLANT
UNDERPAD 30X36 HEAVY ABSORB (UNDERPADS AND DIAPERS) ×1 IMPLANT
YANKAUER SUCT BULB TIP NO VENT (SUCTIONS) IMPLANT

## 2023-11-12 NOTE — H&P (Signed)
History and Physical Interval Note:  11/12/2023 12:19 PM  Sabrina Roberts  has presented today for surgery, with the diagnosis of right foot tailors bunion.  The various methods of treatment have been discussed with the patient and family. After consideration of risks, benefits and other options for treatment, the patient has consented to   Procedure(s): TAYLORS BUNIONECTOMY (Right) as a surgical intervention.  The patient's history has been reviewed, patient examined, no change in status, stable for surgery.  I have reviewed the patient's chart and labs.  Questions were answered to the patient's satisfaction.     Samira Acero Paulla Dolly

## 2023-11-12 NOTE — Op Note (Signed)
OPERATIVE REPORT Patient name: Sabrina Roberts MRN: [id number] DOB: 1971-08-03  DOS:  11/12/2023  Preop Dx: Pre-op testing - Plan: CBG per Guidelines for Diabetes Management for Patients Undergoing Surgery ([facility], [facility], and [facility] only), CBG per protocol, CBG per Guidelines for Diabetes Management for Patients Undergoing Surgery ([facility], [facility], and [facility] only), CBG per protocol Postop Dx: same  Procedure:  1. Tailors bunionectomy right foot  Surgeon: Hinton Rao, DPM  Anesthesia: 1% lidocaine plain totaling 20cc infiltrated in the patient's right lower extremity  Hemostasis: Ankle tourniquet inflated to a pressure of after esmarch exsanguination   EBL: 5 mL Materials: .0062 kirschner wire Injectables: 20 cc lidocaine plain injected in reverse mayo block to operative foot. Pathology: none  Condition: The patient tolerated the procedure and anesthesia well. No complications noted or reported   Justification for procedure: The patient is a 52 y.o. female who presents today for surgical correction of tailors bunion right foot. All conservative modalities of been unsuccessful in providing any sort of satisfactory alleviation of symptoms with the patient. The patient was identified preoperatively and the correct operative extremity was marked by myself. Patient was educated pre-operatively on risks, complications, and alternatives. The patient consented for surgical correction. The patient consent form was reviewed. All patient questions were answered. No guarantees were expressed or implied. The patient and the surgeon both signed the patient consent form with the witness present and placed in the patient's chart.   Procedure in Detail: The patient was brought to the operating room, placed on the operating table in a supine position and anesthesia was induced. The right lower extremity was prepped and draped in a standard sterile fashion and a timeout was performed to verify the correct operative  site. Attention was then directed to the surgical area where procedure number one commenced.  Procedure #1: Osteotomy tailors bunionectomy Distal Tailors Bunionectomy with osteotomy CPT [phone]:  I began the case by creating a dorsal-lateral incision at the level of the proximal fifth metatarsophalangeal joint.  The incision was carefully deepened down through the soft tissues to the level of the capsule.  The capsule was incised longitudinally along its medial aspect and then elevated along the dorsal aspect of the first metatarsal head.  An oscillating saw was utilized to resect the lateral eminence.  We then proceeded to utilize the oscillating saw to create a transverse osteotomy through the neck of the fifth metatarsal.  The osteotomy site was freed up with a hand osteotome and the capital fragment was then shifted approximately 6 mm laterally.  I was happy with the position and proceeded to then pin the correction with a 0.062 kirschner wire.  A fluoroscopic image was obtained and I was happy with the overall alignment clinically and fluoroscopically.  The lateral capsule was closed with interrupted 2-0 Vicryl horizontal mattress sutures. Skin closure was achieved with a 4-0 nylon suture.   Dry sterile compressive dressings were then applied to all previously mentioned incision sites about the patient's lower extremity. The tourniquet which was used for hemostasis was deflated. All normal neurovascular responses including pink color and warmth returned all the digits of patient's lower extremity.  The patient was then transferred from the operating room to the recovery room having tolerated the procedure and anesthesia well. All vital signs are stable. After a brief stay in the recovery room the patient was discharged with adequate prescriptions for analgesia. Verbal as well as written instructions were provided for the patient regarding wound care. The patient  is to keep the dressings clean dry and  intact until they are to follow surgeon Dr. Hinton Rao, DPM in the office upon discharge.   Hinton Rao DPM

## 2023-11-12 NOTE — Anesthesia Preprocedure Evaluation (Addendum)
Anesthesia Evaluation  Patient identified by MRN, date of birth, ID band Patient awake    Reviewed: Allergy & Precautions, NPO status , Patient's Chart, lab work & pertinent test results  History of Anesthesia Complications Negative for: history of anesthetic complications  Airway Mallampati: II  TM Distance: >3 FB Neck ROM: Full    Dental  (+) Dental Advisory Given   Pulmonary neg pulmonary ROS   Pulmonary exam normal        Cardiovascular hypertension, Normal cardiovascular exam     Neuro/Psych  PSYCHIATRIC DISORDERS Anxiety Depression    negative neurological ROS     GI/Hepatic Neg liver ROS,,, S/p gastric bypass    Endo/Other  diabetes, Type 2   Obesity   Renal/GU negative Renal ROS     Musculoskeletal  (+) Arthritis ,    Abdominal   Peds  Hematology  (+) Blood dyscrasia, anemia   Anesthesia Other Findings On GLP-1a   Reproductive/Obstetrics  Hx gHTN                              Anesthesia Physical Anesthesia Plan  ASA: 3  Anesthesia Plan: MAC   Post-op Pain Management: Tylenol PO (pre-op)* and Celebrex PO (pre-op)*   Induction:   PONV Risk Score and Plan: 2 and Propofol infusion and Treatment may vary due to age or medical condition  Airway Management Planned: Natural Airway and Simple Face Mask  Additional Equipment: None  Intra-op Plan:   Post-operative Plan:   Informed Consent: I have reviewed the patients History and Physical, chart, labs and discussed the procedure including the risks, benefits and alternatives for the proposed anesthesia with the patient or authorized representative who has indicated his/her understanding and acceptance.       Plan Discussed with: CRNA and Anesthesiologist  Anesthesia Plan Comments:        Anesthesia Quick Evaluation

## 2023-11-12 NOTE — Progress Notes (Signed)
H & p on chart for 11-12-2023 surgery

## 2023-11-12 NOTE — Transfer of Care (Signed)
Immediate Anesthesia Transfer of Care Note  Patient: Sabrina Roberts  Procedure(s) Performed: Arther Dames (Right: Toe)  Patient Location: PACU  Anesthesia Type:MAC  Level of Consciousness: awake, alert , oriented, and patient cooperative  Airway & Oxygen Therapy: Patient Spontanous Breathing  Post-op Assessment: Report given to RN and Post -op Vital signs reviewed and stable  Post vital signs: Reviewed and stable  Last Vitals:  Vitals Value Taken Time  BP    Temp    Pulse 77 11/12/23 1324  Resp 16 11/12/23 1324  SpO2 95 % 11/12/23 1324  Vitals shown include unfiled device data.  Last Pain:  Vitals:   11/12/23 1323  TempSrc:   PainSc: 6       Patients Stated Pain Goal: 6 (11/12/23 1035)  Complications: No notable events documented.

## 2023-11-12 NOTE — Discharge Instructions (Addendum)
Post-Operative Discharge Instructions  Patient name: ABRAHAM DIRK MRN: [id number] DOB: [birth year]  Date of Surgery: 11/12/2023            Location: [facility]  Follow up appointment:  Next week  On the Day of Surgery: Please pick up prescription(s) prior to the morning of surgery.  Please go directly to the couch or bed and keep your feet elevated by putting two pillows under your feet and one pillow under your knees. Keep your feet out from under the blanket.   Discomfort and Swelling: Your foot may be numb for the remainder of the day. Swelling is expected. In some cases, the skin of the foot or the leg may take on a bruised, black and blue appearance.   Temperature: Take your temperature on the 2nd, 3rd, and 4th day after your surgery at 5:00pm. If your temperature is above 101 degrees, please call the physician's office.   Bleeding: A slight amount of drainage on the dressing is normal. Resting your foot in an elevated position will limit bleeding. If bleeding continues, wrap a towel around your foot and apply an ice pack.   Dressing: Keep the bandage absolutely dry and DO NOT remove the dressing.  If the dressing becomes wet or soiled, call your physician's office immediately.   Stitches: The stitches will remain in place for about two weeks, depending on the nature of your operation. Slight pulling sensation may be felt due to the stitches. This is a normal occurrence.   Ice: Apply a well-sealed ice pack to your foot or behind your knee for 20 minutes out of every hour for the first 24 hours after your surgery. DO NOT leave the ice pack in place at bedtime or during long naps. Do not place an ice pack directly on skin, place a washcloth between the ice pack and the dressing. This will help avoid getting the dressing wet, and help avoid any ice burn.  Weight Bearing Status:  Non-weight bearing  Limited weight bearing in boot     Shoes: Wear your post-operative  shoe or boot anytime you put weight on your feet, unless noted above. DO NOT DRIVE WITH BOOT ON RIGHT FOOT.  Diet: Return to your regular diet slowly within 24 hours. If you received sedation, your first meal should be light. Do not eat greasy or spicy foods for the first meal. Drink large quantities of liquids, especially water, citrus and other fruit juices. Please call if you're unable to keep fluids down. Eat food with your medication.  General Activities: Recovery is a gradual process; however you should feel better with each passing day. The first day, leave the bed only to go to the bathroom. Gradually increase your activity after the first day. For the first week or two, resting each day is important. Strenuous work, heavy lifting and excessive social activities should be avoided. Rest. Ice. Elevate.  Postoperative Care: The post-operative care periods lasts for approximately 6-12 weeks. During this time, periodic visits to your physician's office will be required so your healing process can be monitored closely. It is essential for your recovery that you continue to be monitored by your physician until you are discharged and completely healed. Care during this time is the most important part of your recovery process.   Recovery from anesthesia: You may feel drowsy and reflexes may be slowed for 24 hours. Do not drive, use machinery, appliances, ride bicycles, or scooter. Do not make important decisions.  Complications: Please call your physician following surgery if you have any of the following complications:          1.)  Severe pain unrelieved by medication   2.) Excessive heavy or prolonged bleeding   3.) Dizziness or fainting   4.) Soiled or wet dressings   5.) Temperature over 101.0 degrees    You were given Tylenol prior to your surgery today, you may take more Tylenol after 5pm today

## 2023-11-12 NOTE — Anesthesia Postprocedure Evaluation (Signed)
Anesthesia Post Note  Patient: Sabrina Roberts  Procedure(s) Performed: Arther Dames (Right: Toe)     Patient location during evaluation: PACU Anesthesia Type: MAC Level of consciousness: awake and alert Pain management: pain level controlled Vital Signs Assessment: post-procedure vital signs reviewed and stable Respiratory status: spontaneous breathing, nonlabored ventilation and respiratory function stable Cardiovascular status: stable and blood pressure returned to baseline Anesthetic complications: no   No notable events documented.  Last Vitals:  Vitals:   11/12/23 1330 11/12/23 1523  BP:  111/76  Pulse:  67  Resp:  15  Temp:  36.8 C  SpO2: 95% 98%    Last Pain:  Vitals:   11/12/23 1523  TempSrc:   PainSc: 0-No pain                 Beryle Lathe

## 2023-11-16 ENCOUNTER — Encounter (HOSPITAL_BASED_OUTPATIENT_CLINIC_OR_DEPARTMENT_OTHER): Payer: Self-pay | Admitting: Podiatry

## 2023-11-23 ENCOUNTER — Ambulatory Visit: Payer: Medicare Other

## 2023-11-30 ENCOUNTER — Ambulatory Visit: Payer: Medicare Other

## 2023-12-03 ENCOUNTER — Other Ambulatory Visit (HOSPITAL_COMMUNITY): Payer: Self-pay | Admitting: Psychiatry

## 2023-12-03 DIAGNOSIS — F5101 Primary insomnia: Secondary | ICD-10-CM

## 2023-12-07 ENCOUNTER — Ambulatory Visit: Payer: Medicare Other

## 2024-01-13 ENCOUNTER — Encounter: Payer: Self-pay | Admitting: Internal Medicine

## 2024-01-13 ENCOUNTER — Ambulatory Visit (INDEPENDENT_AMBULATORY_CARE_PROVIDER_SITE_OTHER): Payer: Medicare Other | Admitting: Internal Medicine

## 2024-01-13 VITALS — BP 136/84 | HR 85 | Ht 64.0 in | Wt 208.2 lb

## 2024-01-13 DIAGNOSIS — F331 Major depressive disorder, recurrent, moderate: Secondary | ICD-10-CM

## 2024-01-13 DIAGNOSIS — D649 Anemia, unspecified: Secondary | ICD-10-CM

## 2024-01-13 DIAGNOSIS — F411 Generalized anxiety disorder: Secondary | ICD-10-CM | POA: Diagnosis not present

## 2024-01-13 DIAGNOSIS — K2900 Acute gastritis without bleeding: Secondary | ICD-10-CM

## 2024-01-13 DIAGNOSIS — Z23 Encounter for immunization: Secondary | ICD-10-CM | POA: Diagnosis not present

## 2024-01-13 DIAGNOSIS — R079 Chest pain, unspecified: Secondary | ICD-10-CM

## 2024-01-13 DIAGNOSIS — F5101 Primary insomnia: Secondary | ICD-10-CM

## 2024-01-13 DIAGNOSIS — R7989 Other specified abnormal findings of blood chemistry: Secondary | ICD-10-CM

## 2024-01-13 MED ORDER — FAMOTIDINE 20 MG PO TABS: 20.0000 mg | ORAL_TABLET | Freq: Two times a day (BID) | ORAL | 0 refills | Status: DC | PRN

## 2024-01-13 MED ORDER — QUETIAPINE FUMARATE 100 MG PO TABS: 100.0000 mg | ORAL_TABLET | Freq: Every day | ORAL | 0 refills | Status: DC

## 2024-01-13 MED ORDER — QUETIAPINE FUMARATE 25 MG PO TABS: 12.5000 mg | ORAL_TABLET | Freq: Two times a day (BID) | ORAL | 0 refills | Status: DC | PRN

## 2024-01-13 NOTE — Assessment & Plan Note (Signed)
Lab Results  Component Value Date   TSH 0.276 (L) 11/05/2023   With normal free T4 Recheck TSH and free T4

## 2024-01-13 NOTE — Assessment & Plan Note (Signed)
EKG: Sinus rhythm.  No signs of active ischemia. Her chest pain appears to be noncardiac in etiology, mostly due to panic episodes Needs to restart Seroquel

## 2024-01-13 NOTE — Assessment & Plan Note (Signed)
Restart Seroquel Sleep hygiene material provided

## 2024-01-13 NOTE — Progress Notes (Signed)
Acute Office Visit  Subjective:    Patient ID: Sabrina Roberts, female    DOB: [birth year], 53 y.o.   MRN: [id number]  Chief Complaint  Patient presents with   Headache    Pt reports headaches and feeling winded ongoing for 3 weeks ago, worsened since last week. States she didn't receive refill for Seroquel due to missing her appt with psych.     HPI Patient is in today for complaint of occipital area headache for the last 3 weeks.  Pain is intermittent, worse in the morning.  Denies visual disturbance.  She reports that she has been feeling more anxious and has not been sleeping well since running out of Seroquel in 11/24.  She was taking Seroquel 100 mg nightly for GAD and MDD.  She had missed an appointment with psychiatry.  She also reports chest tightness, and dyspnea episodes.  Denies any relationship with exertion.  Her BP was wnl today.  She also reports having epigastric discomfort and nausea, but denied episodes of vomiting.  Denies melena or hematochezia.  Of note, she takes Trulicity for type II DM.  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   Wears contact lenses     Past Surgical History:  Procedure Laterality Date   BUNIONECTOMY Right 11/12/2023   Procedure: Arther Dames;  Surgeon: Kieth Brightly, DPM;  Location: [facility] [place];  Service: Podiatry;  Laterality: Right;   CARPAL TUNNEL RELEASE Right 10/01/2016   Procedure: RIGHT CARPAL TUNNEL RELEASE;  Surgeon: Vickki Hearing, MD;  Location: [facility];  Service: Orthopedics;  Laterality: Right;   CARPAL TUNNEL RELEASE Left 10/22/2016   Procedure: CARPAL TUNNEL RELEASE;  Surgeon: Vickki Hearing, MD;   Location: [facility];  Service: Orthopedics;  Laterality: Left;   CESAREAN SECTION  2002   CHOLECYSTECTOMY  dec 2012   GASTRIC BYPASS  dec 2012   KNEE ARTHROSCOPY Right    METATARSAL OSTEOTOMY Left 05/14/2023   Procedure: METATARSAL OSTEOTOMY;  Surgeon: Kieth Brightly, DPM;  Location: [facility];  Service: Podiatry;  Laterality: Left;  POP BLOCK    Family History  Problem Relation Age of Onset   Hypertension Mother    Depression Mother    Diabetes Mother    Irritable bowel syndrome Mother    Heart disease Mother    Stroke Mother    Stomach cancer Father    Hypertension Father    Hyperlipidemia Father    Prostate cancer Father    Colon polyps Father    Heart disease Father    Kidney Stones Father    Diverticulitis Father    Colon cancer Father    Breast cancer Maternal Aunt    Colon polyps Paternal Aunt        x 3   Stomach cancer Paternal Uncle    Diabetes Maternal Grandmother    Breast cancer Paternal Grandmother    Colon cancer Paternal Grandfather    Colon cancer Other        Paternal Great Uncles x 4   Esophageal cancer Neg Hx  Rectal cancer Neg Hx     Social History   Socioeconomic History   Marital status: Widowed    Spouse name: Not on file   Number of children: Not on file   Years of education: Not on file   Highest education level: Not on file  Occupational History   Not on file  Tobacco Use   Smoking status: Never    Passive exposure: Current   Smokeless tobacco: Never  Vaping Use   Vaping status: Never Used  Substance and Sexual Activity   Alcohol use: Yes    Comment: 1 mixed drink every week or every month   Drug use: No   Sexual activity: Yes    Partners: Male    Birth control/protection: None  Other Topics Concern   Not on file  Social History Narrative   Not on file   Social Drivers of Health   Financial Resource Strain: Not on file  Food Insecurity: Not on file  Transportation Needs: Not on file  Physical  Activity: Not on file  Stress: Not on file  Social Connections: Not on file  Intimate Partner Violence: Not on file    Outpatient Medications Prior to Visit  Medication Sig Dispense Refill   atorvastatin (LIPITOR) 10 MG tablet Take 1 tablet (10 mg total) by mouth daily. 90 tablet 1   Blood Glucose Monitoring Suppl (BLOOD GLUCOSE SYSTEM PAK) KIT Please dispense based on patient and insurance preference.  Use as directed to monitor FSBS 1-2 times daily.  Dx E11.9 1 kit 1   diazepam (VALIUM) 5 MG tablet Take 1 tablet (5 mg total) by mouth every 12 (twelve) hours as needed for anxiety. 5 tablet 0   Dulaglutide (TRULICITY) 3 MG/0.5ML SOPN Inject 3 mg as directed once a week. 2 mL 2   DULoxetine (CYMBALTA) 60 MG capsule Take 2 capsules (120 mg total) by mouth daily. 60 capsule 2   FEROSUL 325 (65 Fe) MG tablet Take 1 tablet by mouth once daily with breakfast 30 tablet 0   folic acid (FOLVITE) 1 MG tablet Take 1 mg by mouth daily.     folic acid (FOLVITE) 400 MCG tablet Take 400 mcg by mouth daily.     glucose blood test strip Please dispense based on patient and insurance preference.  Use as directed to monitor FSBS 1-2 times daily.  Dx E11.9 100 each 11   HYDROcodone-acetaminophen (NORCO) 5-325 MG tablet Take 1 tablet by mouth every 6 (six) hours as needed for severe pain. 6 tablet 0   ketoconazole (NIZORAL) 2 % cream Apply topically 2 (two) times daily.     Lancets MISC Please dispense based on patient and insurance preference.  Use as directed to monitor FSBS 1-2 times daily.  Dx E11.9 100 each 11   LYRICA 150 MG capsule      Multiple Vitamin (MULTIVITAMIN) tablet Take 1 tablet by mouth daily.      rOPINIRole (REQUIP) 0.5 MG tablet Take 0.5-1.5 mg by mouth at bedtime.     tiZANidine (ZANAFLEX) 4 MG tablet Take 4 mg by mouth at bedtime.     Vitamin D, Ergocalciferol, (DRISDOL) 1.25 MG (50000 UNIT) CAPS capsule Take 1 capsule (50,000 Units total) by mouth once a week. 20 capsule 1   QUEtiapine  (SEROQUEL) 100 MG tablet TAKE 1 TABLET BY MOUTH AT BEDTIME 30 tablet 0   QUEtiapine (SEROQUEL) 25 MG tablet Take 0.5 tablets (12.5 mg total) by mouth 2 (two) times daily as needed (panic  attacks). 60 tablet 2   No facility-administered medications prior to visit.    Allergies  Allergen Reactions   Actos [Pioglitazone Hydrochloride] Anaphylaxis, Hives and Other (See Comments)    Chest pain    Latex Rash    Review of Systems  Constitutional:  Negative for chills and fever.  HENT:  Negative for congestion, sinus pressure, sinus pain and sore throat.   Eyes:  Negative for pain and discharge.  Respiratory:  Positive for shortness of breath. Negative for cough.   Cardiovascular:  Positive for chest pain. Negative for palpitations.  Gastrointestinal:  Positive for nausea. Negative for abdominal pain, diarrhea and vomiting.  Endocrine: Negative for polydipsia and polyuria.  Genitourinary:  Negative for dysuria and hematuria.  Musculoskeletal:  Negative for neck pain and neck stiffness.  Skin:  Negative for rash.  Neurological:  Negative for dizziness and weakness.  Psychiatric/Behavioral:  Positive for dysphoric mood and sleep disturbance. Negative for agitation and behavioral problems. The patient is nervous/anxious.        Objective:    Physical Exam Vitals reviewed.  Constitutional:      General: She is not in acute distress.    Appearance: She is obese. She is not diaphoretic.  HENT:     Head: Normocephalic and atraumatic.     Nose: Nose normal.     Mouth/Throat:     Mouth: Mucous membranes are moist.  Eyes:     General: No scleral icterus.    Extraocular Movements: Extraocular movements intact.  Cardiovascular:     Rate and Rhythm: Normal rate and regular rhythm.     Heart sounds: Normal heart sounds. No murmur heard. Pulmonary:     Breath sounds: Normal breath sounds. No wheezing or rales.  Abdominal:     Palpations: Abdomen is soft.     Tenderness: There is no  abdominal tenderness.  Musculoskeletal:     Cervical back: Neck supple. No tenderness.     Right lower leg: No edema.     Left lower leg: No edema.  Skin:    General: Skin is warm.     Findings: No rash.  Neurological:     General: No focal deficit present.     Mental Status: She is alert and oriented to person, place, and time.     Cranial Nerves: No cranial nerve deficit.     Sensory: No sensory deficit.     Motor: No weakness.  Psychiatric:        Mood and Affect: Mood normal.        Behavior: Behavior normal.     BP 136/84   Pulse 85   Ht 5\' 4"  (1.626 m)   Wt 208 lb 3.2 oz (94.4 kg)   LMP  (LMP Unknown)   SpO2 98%   BMI 35.74 kg/m  Wt Readings from Last 3 Encounters:  01/13/24 208 lb 3.2 oz (94.4 kg)  11/12/23 215 lb 8 oz (97.8 kg)  11/04/23 218 lb 1.3 oz (98.9 kg)        Assessment & Plan:   Problem List Items Addressed This Visit       Digestive   Acute gastritis without hemorrhage   Epigastric discomfort could be due to gastritis/GERD Trial of Pepcid 20 mg twice daily as needed Avoid hot and spicy food      Relevant Medications   famotidine (PEPCID) 20 MG tablet     Other   Moderate episode of recurrent major depressive disorder (HCC) (Chronic)  Was on Cymbalta 120 mg QD and Seroquel 100 mg nightly Needs psychiatry follow-up Refilled Seroquel 100 mg nightly for now to improve insomnia      Relevant Orders   Basic Metabolic Panel (BMET)   Insomnia   Restart Seroquel Sleep hygiene material provided      Relevant Medications   QUEtiapine (SEROQUEL) 100 MG tablet   Normocytic anemia   Has history of anemia, had B12 deficiency in the past Check CBC today      Relevant Orders   CBC   Abnormal TSH   Lab Results  Component Value Date   TSH 0.276 (L) 11/05/2023   With normal free T4 Recheck TSH and free T4      Relevant Orders   TSH + free T4   GAD (generalized anxiety disorder) - Primary   Uncontrolled due to noncompliance Her  chest tightness, dyspnea and headache are likely due to lack of sleep and worsening GAD/panic episode Restart Seroquel 100 mg nightly for insomnia and 12.5 mg PRN for anxiety/agitation Needs psychiatry follow-up      Relevant Medications   QUEtiapine (SEROQUEL) 25 MG tablet   Other Relevant Orders   Basic Metabolic Panel (BMET)   Chest pain   EKG: Sinus rhythm.  No signs of active ischemia. Her chest pain appears to be noncardiac in etiology, mostly due to panic episodes Needs to restart Seroquel      Relevant Orders   EKG 12-Lead (Completed)   Other Visit Diagnoses       Encounter for immunization       Relevant Orders   Flu vaccine trivalent PF, 6mos and older(Flulaval,Afluria,Fluarix,Fluzone) (Completed)        Meds ordered this encounter  Medications   QUEtiapine (SEROQUEL) 25 MG tablet    Sig: Take 0.5 tablets (12.5 mg total) by mouth 2 (two) times daily as needed (panic attacks).    Dispense:  60 tablet    Refill:  0   QUEtiapine (SEROQUEL) 100 MG tablet    Sig: Take 1 tablet (100 mg total) by mouth at bedtime.    Dispense:  30 tablet    Refill:  0   famotidine (PEPCID) 20 MG tablet    Sig: Take 1 tablet (20 mg total) by mouth 2 (two) times daily as needed for heartburn or indigestion.    Dispense:  30 tablet    Refill:  0     Weyman Bogdon Concha Se, MD

## 2024-01-13 NOTE — Assessment & Plan Note (Signed)
Epigastric discomfort could be due to gastritis/GERD Trial of Pepcid 20 mg twice daily as needed Avoid hot and spicy food

## 2024-01-13 NOTE — Assessment & Plan Note (Signed)
Was on Cymbalta 120 mg QD and Seroquel 100 mg nightly Needs psychiatry follow-up Refilled Seroquel 100 mg nightly for now to improve insomnia

## 2024-01-13 NOTE — Assessment & Plan Note (Signed)
Uncontrolled due to noncompliance Her chest tightness, dyspnea and headache are likely due to lack of sleep and worsening GAD/panic episode Restart Seroquel 100 mg nightly for insomnia and 12.5 mg PRN for anxiety/agitation Needs psychiatry follow-up

## 2024-01-13 NOTE — Assessment & Plan Note (Signed)
Has history of anemia, had B12 deficiency in the past Check CBC today

## 2024-01-13 NOTE — Patient Instructions (Addendum)
Please start taking Seroquel as prescribed.  Please continue to take at least 64 ounces of fluid in a day and eat at regular intervals.  Please maintain simple sleep hygiene. - Maintain dark and non-noisy environment in the bedroom. - Please use the bedroom for sleep and sexual activity only. - Do not use electronic devices in the bedroom. - Please take dinner at least 2 hours before bedtime. - Please avoid caffeinated products in the evening, including coffee, soft drinks. - Please try to maintain the regular sleep-wake cycle - Go to bed and wake up at the same time.

## 2024-01-14 ENCOUNTER — Other Ambulatory Visit: Payer: Self-pay | Admitting: Internal Medicine

## 2024-01-14 DIAGNOSIS — E0590 Thyrotoxicosis, unspecified without thyrotoxic crisis or storm: Secondary | ICD-10-CM

## 2024-01-14 LAB — CBC
Hematocrit: 38.2 % (ref 34.0–46.6)
Hemoglobin: 12.3 g/dL (ref 11.1–15.9)
MCH: 30.5 pg (ref 26.6–33.0)
MCHC: 32.2 g/dL (ref 31.5–35.7)
MCV: 95 fL (ref 79–97)
Platelets: 298 10*3/uL (ref 150–450)
RBC: 4.03 x10E6/uL (ref 3.77–5.28)
RDW: 13 % (ref 11.7–15.4)
WBC: 4.9 10*3/uL (ref 3.4–10.8)

## 2024-01-14 LAB — BASIC METABOLIC PANEL
BUN/Creatinine Ratio: 15 (ref 9–23)
BUN: 17 mg/dL (ref 6–24)
CO2: 19 mmol/L — ABNORMAL LOW (ref 20–29)
Calcium: 9.5 mg/dL (ref 8.7–10.2)
Chloride: 102 mmol/L (ref 96–106)
Creatinine, Ser: 1.12 mg/dL — ABNORMAL HIGH (ref 0.57–1.00)
Glucose: 99 mg/dL (ref 70–99)
Potassium: 4.5 mmol/L (ref 3.5–5.2)
Sodium: 137 mmol/L (ref 134–144)
eGFR: 59 mL/min/{1.73_m2} — ABNORMAL LOW (ref >59)

## 2024-01-14 LAB — TSH+FREE T4
Free T4: 1.31 ng/dL (ref 0.82–1.77)
TSH: 0.195 u[IU]/mL — ABNORMAL LOW (ref 0.450–4.500)

## 2024-01-20 ENCOUNTER — Encounter (HOSPITAL_COMMUNITY): Payer: Self-pay | Admitting: Psychiatry

## 2024-01-20 ENCOUNTER — Telehealth (HOSPITAL_COMMUNITY): Payer: Self-pay | Admitting: Psychiatry

## 2024-01-20 DIAGNOSIS — F411 Generalized anxiety disorder: Secondary | ICD-10-CM | POA: Diagnosis not present

## 2024-01-20 DIAGNOSIS — M545 Low back pain, unspecified: Secondary | ICD-10-CM

## 2024-01-20 DIAGNOSIS — Z79899 Other long term (current) drug therapy: Secondary | ICD-10-CM

## 2024-01-20 DIAGNOSIS — F5101 Primary insomnia: Secondary | ICD-10-CM | POA: Diagnosis not present

## 2024-01-20 DIAGNOSIS — F4310 Post-traumatic stress disorder, unspecified: Secondary | ICD-10-CM

## 2024-01-20 DIAGNOSIS — E559 Vitamin D deficiency, unspecified: Secondary | ICD-10-CM

## 2024-01-20 DIAGNOSIS — F4001 Agoraphobia with panic disorder: Secondary | ICD-10-CM

## 2024-01-20 DIAGNOSIS — G8929 Other chronic pain: Secondary | ICD-10-CM

## 2024-01-20 DIAGNOSIS — F331 Major depressive disorder, recurrent, moderate: Secondary | ICD-10-CM

## 2024-01-20 MED ORDER — QUETIAPINE FUMARATE 25 MG PO TABS: 25.0000 mg | ORAL_TABLET | Freq: Two times a day (BID) | ORAL | 2 refills | Status: DC | PRN

## 2024-01-20 MED ORDER — QUETIAPINE FUMARATE 50 MG PO TABS: 50.0000 mg | ORAL_TABLET | Freq: Every day | ORAL | 0 refills | Status: DC

## 2024-01-20 MED ORDER — DULOXETINE HCL 60 MG PO CPEP: 120.0000 mg | ORAL_CAPSULE | Freq: Every day | ORAL | 2 refills | Status: DC

## 2024-01-20 NOTE — Patient Instructions (Signed)
We did not make any medication changes today.
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
## 2024-02-07 ENCOUNTER — Ambulatory Visit: Payer: Medicare Other | Admitting: Family Medicine

## 2024-02-15 ENCOUNTER — Encounter: Payer: Self-pay | Admitting: Family Medicine

## 2024-03-17 ENCOUNTER — Telehealth (HOSPITAL_COMMUNITY): Payer: Self-pay

## 2024-03-17 NOTE — Telephone Encounter (Signed)
 03/21/24 appt confirmed by pt

## 2024-03-20 ENCOUNTER — Encounter (HOSPITAL_COMMUNITY): Payer: Self-pay | Admitting: Psychiatry

## 2024-03-20 ENCOUNTER — Telehealth (INDEPENDENT_AMBULATORY_CARE_PROVIDER_SITE_OTHER): Payer: Medicare Other | Admitting: Psychiatry

## 2024-03-20 DIAGNOSIS — F330 Major depressive disorder, recurrent, mild: Secondary | ICD-10-CM

## 2024-03-20 DIAGNOSIS — G8929 Other chronic pain: Secondary | ICD-10-CM

## 2024-03-20 DIAGNOSIS — F5101 Primary insomnia: Secondary | ICD-10-CM

## 2024-03-20 DIAGNOSIS — F4001 Agoraphobia with panic disorder: Secondary | ICD-10-CM | POA: Diagnosis not present

## 2024-03-20 DIAGNOSIS — F411 Generalized anxiety disorder: Secondary | ICD-10-CM | POA: Diagnosis not present

## 2024-03-20 DIAGNOSIS — G2581 Restless legs syndrome: Secondary | ICD-10-CM

## 2024-03-20 DIAGNOSIS — F4310 Post-traumatic stress disorder, unspecified: Secondary | ICD-10-CM

## 2024-03-20 DIAGNOSIS — Z79899 Other long term (current) drug therapy: Secondary | ICD-10-CM

## 2024-03-20 DIAGNOSIS — M545 Low back pain, unspecified: Secondary | ICD-10-CM

## 2024-03-20 MED ORDER — QUETIAPINE FUMARATE 25 MG PO TABS: 25.0000 mg | ORAL_TABLET | Freq: Two times a day (BID) | ORAL | 2 refills | Status: DC | PRN

## 2024-03-20 MED ORDER — QUETIAPINE FUMARATE 50 MG PO TABS: 50.0000 mg | ORAL_TABLET | Freq: Every day | ORAL | 0 refills | Status: DC

## 2024-03-20 MED ORDER — DULOXETINE HCL 60 MG PO CPEP: 120.0000 mg | ORAL_CAPSULE | Freq: Every day | ORAL | 2 refills | Status: DC

## 2024-03-20 NOTE — Patient Instructions (Signed)
 We did not make any medication changes today.  We did practice the 5 senses technique and a quick review of that is looking around the room that you are in in describing the colors that you see, listening for the different sounds that are present, identifying different scents that are present, using your sense of touch to identify different textures/temperatures/rigidness of materials, and you can use a drink or hard candies for sense of taste.
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
## 2024-03-21 ENCOUNTER — Ambulatory Visit (HOSPITAL_COMMUNITY): Payer: Medicare Other | Admitting: Psychiatry

## 2024-05-16 ENCOUNTER — Encounter (HOSPITAL_COMMUNITY): Payer: Self-pay | Admitting: Psychiatry

## 2024-05-16 ENCOUNTER — Telehealth (INDEPENDENT_AMBULATORY_CARE_PROVIDER_SITE_OTHER): Admitting: Psychiatry

## 2024-05-16 DIAGNOSIS — M545 Low back pain, unspecified: Secondary | ICD-10-CM

## 2024-05-16 DIAGNOSIS — F4381 Prolonged grief disorder: Secondary | ICD-10-CM

## 2024-05-16 DIAGNOSIS — F5104 Psychophysiologic insomnia: Secondary | ICD-10-CM

## 2024-05-16 DIAGNOSIS — F330 Major depressive disorder, recurrent, mild: Secondary | ICD-10-CM

## 2024-05-16 DIAGNOSIS — F411 Generalized anxiety disorder: Secondary | ICD-10-CM

## 2024-05-16 DIAGNOSIS — F4001 Agoraphobia with panic disorder: Secondary | ICD-10-CM | POA: Diagnosis not present

## 2024-05-16 DIAGNOSIS — F4310 Post-traumatic stress disorder, unspecified: Secondary | ICD-10-CM

## 2024-05-16 DIAGNOSIS — Z79899 Other long term (current) drug therapy: Secondary | ICD-10-CM

## 2024-05-16 MED ORDER — DULOXETINE HCL 60 MG PO CPEP: 120.0000 mg | ORAL_CAPSULE | Freq: Every day | ORAL | 5 refills | Status: AC

## 2024-05-16 MED ORDER — QUETIAPINE FUMARATE 25 MG PO TABS: ORAL_TABLET | ORAL | 5 refills | Status: DC

## 2024-05-16 NOTE — Patient Instructions (Addendum)
 We changed the seroquel  to 25mg  twice daily as needed for panic and take 2 of the tablets at night to assist with insomnia.
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
## 2024-05-25 ENCOUNTER — Ambulatory Visit: Payer: Self-pay

## 2024-05-25 NOTE — Telephone Encounter (Signed)
 Patient called, left VM to return the call to the office to speak to NT.    Copied from CRM [id number]. Topic: Appointments - Appointment Scheduling >> May 25, 2024  1:52 PM Sabrina Roberts wrote: Hemorrhoids are worsening.. med refills

## 2024-05-25 NOTE — Telephone Encounter (Signed)
 2nd attempt, Patient called, left VM to return the call to the office to speak to NT.   Copied from CRM [id number]. Topic: Appointments - Appointment Scheduling >> May 25, 2024  1:52 PM Emylou G wrote: Hemorrhoids are worsening.. med refills

## 2024-05-25 NOTE — Telephone Encounter (Signed)
 3rd attempt, Patient called, left VM to return the call to the office to speak to NT. Unable to reach patient after 3 attempts by E2C2 NT, routing to the provider for resolution per protocol.

## 2024-05-30 ENCOUNTER — Telehealth: Payer: Self-pay

## 2024-05-30 NOTE — Telephone Encounter (Signed)
 Copied from CRM [id number]. Topic: Referral - Question >> May 30, 2024  1:26 PM Dominic Friendly wrote: Bridgette Campus with [facility] is calling to get the status of a referral that they haven't received [phone]

## 2024-05-31 ENCOUNTER — Telehealth: Payer: Self-pay

## 2024-05-31 NOTE — Telephone Encounter (Signed)
 Called and left voice mail to call back

## 2024-05-31 NOTE — Telephone Encounter (Signed)
 Copied from CRM [id number]. Topic: General - Other >> May 31, 2024  2:00 PM Shardie S wrote: Reason for CRM: Bridgette Campus with [facility] returning missed phone call from [place].  Callback # [phone]

## 2024-06-07 ENCOUNTER — Ambulatory Visit: Payer: Self-pay

## 2024-06-07 NOTE — Telephone Encounter (Signed)
 FYI Only or Action Required?: FYI only for provider  Patient was last seen in primary care on 01/13/2024 by Meldon Sport, MD. Called Nurse Triage reporting Rectal Bleeding. Symptoms began about a month ago. Interventions attempted: Rest, hydration, or home remedies. Symptoms are: unchanged.  Triage Disposition: See Physician Within 24 Hours  Patient/caregiver understands and will follow disposition?: Yes  Copied from CRM [id number]. Topic: Clinical - Red Word Triage >> Jun 07, 2024  1:06 PM Oddis Bench wrote: Red Word that prompted transfer to Nurse Triage: Patient is calling about bleeding from inside hemmroids and she is stating that they have been bothering her for the last couple of weeks. Reason for Disposition  MODERATE rectal bleeding (small blood clots, passing blood without stool, or toilet water turns red)  Answer Assessment - Initial Assessment Questions 1. APPEARANCE of BLOOD: What color is it? Is it passed separately, on the surface of the stool, or mixed in with the stool?      Bright red-mixed in with stool 2. AMOUNT: How much blood was passed?      Very small amounts 3. FREQUENCY: How many times has blood been passed with the stools?      Around 2 weeks ago 4. ONSET: When was the blood first seen in the stools? (Days or weeks)      Started 2 weeks ago 5. DIARRHEA: Is there also some diarrhea? If Yes, ask: How many diarrhea stools in the past 24 hours?      Yes today 6. CONSTIPATION: Do you have constipation? If Yes, ask: How bad is it?     No constipation 7. RECURRENT SYMPTOMS: Have you had blood in your stools before? If Yes, ask: When was the last time? and What happened that time?      yes 8. BLOOD THINNERS: Do you take any blood thinners? (e.g., Coumadin/warfarin, Pradaxa/dabigatran, aspirin )     no 9. OTHER SYMPTOMS: Do you have any other symptoms?  (e.g., abdomen pain, vomiting, dizziness, fever)     no  Protocols used: Rectal  Bleeding-A-AH

## 2024-06-08 ENCOUNTER — Encounter: Payer: Self-pay | Admitting: Family Medicine

## 2024-06-08 ENCOUNTER — Ambulatory Visit (INDEPENDENT_AMBULATORY_CARE_PROVIDER_SITE_OTHER): Admitting: Family Medicine

## 2024-06-08 VITALS — BP 117/80 | HR 77 | Resp 16 | Ht 64.0 in | Wt 214.4 lb

## 2024-06-08 DIAGNOSIS — G479 Sleep disorder, unspecified: Secondary | ICD-10-CM | POA: Diagnosis not present

## 2024-06-08 DIAGNOSIS — E559 Vitamin D deficiency, unspecified: Secondary | ICD-10-CM

## 2024-06-08 DIAGNOSIS — E038 Other specified hypothyroidism: Secondary | ICD-10-CM

## 2024-06-08 DIAGNOSIS — E7849 Other hyperlipidemia: Secondary | ICD-10-CM

## 2024-06-08 DIAGNOSIS — K648 Other hemorrhoids: Secondary | ICD-10-CM

## 2024-06-08 DIAGNOSIS — R7301 Impaired fasting glucose: Secondary | ICD-10-CM | POA: Diagnosis not present

## 2024-06-08 MED ORDER — HYDROXYZINE PAMOATE 25 MG PO CAPS: 25.0000 mg | ORAL_CAPSULE | Freq: Every evening | ORAL | 1 refills | Status: DC | PRN

## 2024-06-08 NOTE — Assessment & Plan Note (Addendum)
 Encouraged the patient to continue using the suppository as previously prescribed. Referral placed to Gastroenterology (GI) for further evaluation. Reviewed signs and symptoms that warrant immediate medical attention, including: Heavy or continuous rectal bleeding Severe pain or swelling Symptoms of anemia (e.g., fatigue, weakness, shortness of breath) Dark or tarry stools

## 2024-06-08 NOTE — Patient Instructions (Addendum)
 I appreciate the opportunity to provide care to you today!    Follow up:  4 months  Labs: please stop by the lab today to get your blood drawn (CBC, CMP, TSH, Lipid profile, HgA1c, Vit D)  When to seek medical care immediately: -Heavy or continuous bleeding. -Severe pain or swelling. -Symptoms of anemia (fatigue, weakness, shortness of breath). -Dark or tarry stools.   Sleep Disturbance Start taking Hydroxyzine  25 mg as needed for sleep. You may increase the dose to 50 mg after 2 weeks, if needed.  Please monitor for side effects, including:  Drowsiness or grogginess the next day Dizziness or lightheadedness Dry mouth Changes in mood or alertness Follow up if side effects become bothersome or if sleep disturbances persist.   Referrals today-  GI  Attached with your AVS, you will find valuable resources for self-education. I highly recommend dedicating some time to thoroughly examine them.   Please continue to a heart-healthy diet and increase your physical activities. Try to exercise for at least five days a week.    It was a pleasure to see you and I look forward to continuing to work together on your health and well-being. Please do not hesitate to call the office if you need care or have questions about your care.  In case of emergency, please visit the Emergency Department for urgent care, or contact our clinic at [phone] to schedule an appointment. We're here to help you!   Have a wonderful day and week. With Gratitude, Lauree Yurick MSN, FNP-BC

## 2024-06-08 NOTE — Assessment & Plan Note (Signed)
 The patient complains of difficulty with sleep initiation. Hydroxyzine  25 mg was initiated today to be taken every eight hours as needed to assist with sleep. Nonpharmacological interventions were discussed, including: Practicing good sleep hygiene (consistent bedtime routine, limiting screen time before bed) Creating a comfortable sleep environment (cool, quiet, and dark room) Avoiding caffeine, alcohol, and heavy meals in the evening Engaging in relaxation techniques such as deep breathing, meditation, or gentle stretching before bed

## 2024-06-08 NOTE — Progress Notes (Signed)
 Established Patient Office Visit  Subjective:  Patient ID: Sabrina Roberts, female    DOB: 03-29-1971  Age: 53 y.o. MRN: [id number]  CC:  Chief Complaint  Patient presents with   Rectal Bleeding    Has been having issues with hemorrhoids for years and they have flared back up a couple weeks ago and she is seeing blood in her stool.    Labs Only    Wants to get her labs checked today- she is fasting     HPI Sabrina Roberts is a 53 y.o. female with a past medical history of internal hemorrhoids, presents with the above complaints. She reports using hydrocortisone suppositories for symptom relief. She previously attempted rubber band ligation for removal of internal hemorrhoids but states that the procedure caused increased discomfort and distress.  Over the past few weeks, she has experienced rectal bleeding related to internal hemorrhoid flare-ups, and also reports episodes of rectal bleeding occurring without active flare-ups on several days.  I reviewed the patient's colonoscopy performed on November 01, 2023, which showed diverticulosis throughout the colon and internal hemorrhoids. The remainder of the examination was normal. A repeat colonoscopy is recommended in 5 years due to her strong family history of colon cancer (father diagnosed before age 51).   Past Medical History:  Diagnosis Date   Anemia    Anxiety    Arthritis    in back   Depression    Diabetes mellitus Type 2    DJD (degenerative joint disease)    herniated disc lower back   Gestational hypertension    Hyperlipidemia    Obesity    Sciatica    in back on left side   Sedative hypnotic dependence: Ambien  03/22/2023   Wears contact lenses     Past Surgical History:  Procedure Laterality Date   BUNIONECTOMY Right 11/12/2023   Procedure: Babetta Bologna;  Surgeon: Ellard Gunning, DPM;  Location: [facility] [place];  Service: Podiatry;  Laterality: Right;   CARPAL TUNNEL RELEASE Right  10/01/2016   Procedure: RIGHT CARPAL TUNNEL RELEASE;  Surgeon: Darrin Emerald, MD;  Location: [facility];  Service: Orthopedics;  Laterality: Right;   CARPAL TUNNEL RELEASE Left 10/22/2016   Procedure: CARPAL TUNNEL RELEASE;  Surgeon: Darrin Emerald, MD;  Location: [facility];  Service: Orthopedics;  Laterality: Left;   CESAREAN SECTION  2002   CHOLECYSTECTOMY  dec 2012   GASTRIC BYPASS  dec 2012   KNEE ARTHROSCOPY Right    METATARSAL OSTEOTOMY Left 05/14/2023   Procedure: METATARSAL OSTEOTOMY;  Surgeon: Ellard Gunning, DPM;  Location: [facility];  Service: Podiatry;  Laterality: Left;  POP BLOCK    Family History  Problem Relation Age of Onset   Hypertension Mother    Depression Mother    Diabetes Mother    Irritable bowel syndrome Mother    Heart disease Mother    Stroke Mother    Stomach cancer Father    Hypertension Father    Hyperlipidemia Father    Prostate cancer Father    Colon polyps Father    Heart disease Father    Kidney Stones Father    Diverticulitis Father    Colon cancer Father    Breast cancer Maternal Aunt    Colon polyps Paternal Aunt        x 3   Stomach cancer Paternal Uncle    Diabetes Maternal Grandmother    Breast cancer Paternal Grandmother    Colon cancer  Paternal Grandfather    Colon cancer Other        Paternal Great Uncles x 4   Esophageal cancer Neg Hx    Rectal cancer Neg Hx     Social History   Socioeconomic History   Marital status: Widowed    Spouse name: Not on file   Number of children: Not on file   Years of education: Not on file   Highest education level: Not on file  Occupational History   Not on file  Tobacco Use   Smoking status: Never    Passive exposure: Current   Smokeless tobacco: Never  Vaping Use   Vaping status: Never Used  Substance and Sexual Activity   Alcohol use: Yes    Comment: 1 mixed drink every week or every month   Drug use: No   Sexual activity: Yes    Partners: Male    Birth  control/protection: None  Other Topics Concern   Not on file  Social History Narrative   Not on file   Social Drivers of Health   Financial Resource Strain: Not on file  Food Insecurity: Not on file  Transportation Needs: Not on file  Physical Activity: Not on file  Stress: Not on file  Social Connections: Not on file  Intimate Partner Violence: Not on file    Outpatient Medications Prior to Visit  Medication Sig Dispense Refill   atorvastatin  (LIPITOR) 10 MG tablet Take 1 tablet (10 mg total) by mouth daily. 90 tablet 1   Blood Glucose Monitoring Suppl (BLOOD GLUCOSE SYSTEM PAK) KIT Please dispense based on patient and insurance preference.  Use as directed to monitor FSBS 1-2 times daily.  Dx E11.9 1 kit 1   buprenorphine (BUTRANS) 5 MCG/HR PTWK Place 1 patch onto the skin once a week.     cyclobenzaprine  (FLEXERIL ) 5 MG tablet Take 5 mg by mouth 3 (three) times daily as needed.     diazepam  (VALIUM ) 5 MG tablet Take 1 tablet (5 mg total) by mouth every 12 (twelve) hours as needed for anxiety. 5 tablet 0   Dulaglutide  (TRULICITY ) 3 MG/0.5ML SOPN Inject 3 mg as directed once a week. 2 mL 2   DULoxetine  (CYMBALTA ) 60 MG capsule Take 2 capsules (120 mg total) by mouth daily. 60 capsule 5   famotidine  (PEPCID ) 20 MG tablet Take 1 tablet (20 mg total) by mouth 2 (two) times daily as needed for heartburn or indigestion. 30 tablet 0   FEROSUL 325 (65 Fe) MG tablet Take 1 tablet by mouth once daily with breakfast 30 tablet 0   folic acid  (FOLVITE ) 1 MG tablet Take 1 mg by mouth daily.     glucose blood test strip Please dispense based on patient and insurance preference.  Use as directed to monitor FSBS 1-2 times daily.  Dx E11.9 100 each 11   ketoconazole (NIZORAL) 2 % cream Apply topically 2 (two) times daily.     Lancets MISC Please dispense based on patient and insurance preference.  Use as directed to monitor FSBS 1-2 times daily.  Dx E11.9 100 each 11   LYRICA  150 MG capsule       Multiple Vitamin (MULTIVITAMIN) tablet Take 1 tablet by mouth daily.      naloxone (NARCAN) nasal spray 4 mg/0.1 mL Place 1 spray into the nose once.     QUEtiapine  (SEROQUEL ) 25 MG tablet Take 2 pills nightly for insomnia. Take 1 pill twice daily as needed for panic. 120 tablet  5   rOPINIRole (REQUIP) 0.5 MG tablet Take 0.5-1.5 mg by mouth at bedtime.     Vitamin D , Ergocalciferol , (DRISDOL ) 1.25 MG (50000 UNIT) CAPS capsule Take 1 capsule (50,000 Units total) by mouth once a week. 20 capsule 1   No facility-administered medications prior to visit.    Allergies  Allergen Reactions   Actos [Pioglitazone Hydrochloride] Anaphylaxis, Hives and Other (See Comments)    Chest pain    Latex Rash    ROS Review of Systems  Constitutional:  Negative for chills and fever.  Eyes:  Negative for visual disturbance.  Respiratory:  Negative for chest tightness and shortness of breath.   Gastrointestinal:        Rectal bleeding  Neurological:  Negative for dizziness and headaches.      Objective:    Physical Exam HENT:     Head: Normocephalic.     Mouth/Throat:     Mouth: Mucous membranes are moist.   Cardiovascular:     Rate and Rhythm: Normal rate.     Heart sounds: Normal heart sounds.  Pulmonary:     Effort: Pulmonary effort is normal.     Breath sounds: Normal breath sounds.  Genitourinary:    Comments: Rectal bleeding  Neurological:     Mental Status: She is alert.     BP 117/80   Pulse 77   Resp 16   Ht 5' 4 (1.626 m)   Wt 214 lb 6.4 oz (97.3 kg)   LMP  (LMP Unknown)   SpO2 97%   BMI 36.80 kg/m  Wt Readings from Last 3 Encounters:  06/08/24 214 lb 6.4 oz (97.3 kg)  01/13/24 208 lb 3.2 oz (94.4 kg)  11/12/23 215 lb 8 oz (97.8 kg)    Lab Results  Component Value Date   TSH 0.195 (L) 01/13/2024   Lab Results  Component Value Date   WBC 4.9 01/13/2024   HGB 12.3 01/13/2024   HCT 38.2 01/13/2024   MCV 95 01/13/2024   PLT 298 01/13/2024   Lab Results   Component Value Date   NA 137 01/13/2024   K 4.5 01/13/2024   CO2 19 (L) 01/13/2024   GLUCOSE 99 01/13/2024   BUN 17 01/13/2024   CREATININE 1.12 (H) 01/13/2024   BILITOT 0.5 11/05/2023   ALKPHOS 143 (H) 11/05/2023   AST 16 11/05/2023   ALT 9 11/05/2023   PROT 6.6 11/05/2023   ALBUMIN 3.9 11/05/2023   CALCIUM  9.5 01/13/2024   ANIONGAP 8 02/14/2022   EGFR 59 (L) 01/13/2024   Lab Results  Component Value Date   CHOL 167 11/05/2023   Lab Results  Component Value Date   HDL 57 11/05/2023   Lab Results  Component Value Date   LDLCALC 94 11/05/2023   Lab Results  Component Value Date   TRIG 83 11/05/2023   Lab Results  Component Value Date   CHOLHDL 2.9 11/05/2023   Lab Results  Component Value Date   HGBA1C 6.2 (H) 11/05/2023      Assessment & Plan:  Internal hemorrhoid, bleeding Assessment & Plan: Encouraged the patient to continue using the suppository as previously prescribed. Referral placed to Gastroenterology (GI) for further evaluation. Reviewed signs and symptoms that warrant immediate medical attention, including: Heavy or continuous rectal bleeding Severe pain or swelling Symptoms of anemia (e.g., fatigue, weakness, shortness of breath) Dark or tarry stools    Orders: -     Ambulatory referral to Gastroenterology  Sleep disturbance Assessment & Plan:  The patient complains of difficulty with sleep initiation. Hydroxyzine  25 mg was initiated today to be taken every eight hours as needed to assist with sleep. Nonpharmacological interventions were discussed, including: Practicing good sleep hygiene (consistent bedtime routine, limiting screen time before bed) Creating a comfortable sleep environment (cool, quiet, and dark room) Avoiding caffeine, alcohol, and heavy meals in the evening Engaging in relaxation techniques such as deep breathing, meditation, or gentle stretching before bed   Orders: -     hydrOXYzine  Pamoate; Take 1 capsule (25 mg  total) by mouth at bedtime as needed.  Dispense: 30 capsule; Refill: 1  IFG (impaired fasting glucose) -     Hemoglobin A1c  Vitamin D  deficiency -     VITAMIN D  25 Hydroxy (Vit-D Deficiency, Fractures)  TSH (thyroid -stimulating hormone deficiency) -     TSH + free T4  Other hyperlipidemia -     Lipid panel -     CMP14+EGFR -     CBC with Differential/Platelet  Note: This chart has been completed using Engineer, civil (consulting) software, and while attempts have been made to ensure accuracy, certain words and phrases may not be transcribed as intended.    Follow-up: Return in about 4 months (around 10/08/2024).   Katharyn Schauer, FNP

## 2024-06-12 ENCOUNTER — Telehealth: Payer: Self-pay | Admitting: Family Medicine

## 2024-06-12 NOTE — Telephone Encounter (Signed)
 LVM for pt to call back and schedule DRS

## 2024-06-15 LAB — CMP14+EGFR
ALT: 10 IU/L (ref 0–32)
AST: 14 IU/L (ref 0–40)
Albumin: 4 g/dL (ref 3.8–4.9)
Alkaline Phosphatase: 138 IU/L — ABNORMAL HIGH (ref 44–121)
BUN/Creatinine Ratio: 18 (ref 9–23)
BUN: 16 mg/dL (ref 6–24)
Bilirubin Total: 0.3 mg/dL (ref 0.0–1.2)
CO2: 19 mmol/L — ABNORMAL LOW (ref 20–29)
Calcium: 9 mg/dL (ref 8.7–10.2)
Chloride: 106 mmol/L (ref 96–106)
Creatinine, Ser: 0.88 mg/dL (ref 0.57–1.00)
Globulin, Total: 2.4 g/dL (ref 1.5–4.5)
Glucose: 95 mg/dL (ref 70–99)
Potassium: 4.9 mmol/L (ref 3.5–5.2)
Sodium: 138 mmol/L (ref 134–144)
Total Protein: 6.4 g/dL (ref 6.0–8.5)
eGFR: 79 mL/min/{1.73_m2} (ref >59)

## 2024-06-15 LAB — CBC WITH DIFFERENTIAL/PLATELET
Basophils Absolute: 0 10*3/uL (ref 0.0–0.2)
Basos: 1 %
EOS (ABSOLUTE): 0.2 10*3/uL (ref 0.0–0.4)
Eos: 3 %
Hematocrit: 36.7 % (ref 34.0–46.6)
Hemoglobin: 11.6 g/dL (ref 11.1–15.9)
Immature Grans (Abs): 0 10*3/uL (ref 0.0–0.1)
Immature Granulocytes: 0 %
Lymphocytes Absolute: 2.1 10*3/uL (ref 0.7–3.1)
Lymphs: 42 %
MCH: 31.8 pg (ref 26.6–33.0)
MCHC: 31.6 g/dL (ref 31.5–35.7)
MCV: 101 fL — ABNORMAL HIGH (ref 79–97)
Monocytes Absolute: 0.4 10*3/uL (ref 0.1–0.9)
Monocytes: 9 %
Neutrophils Absolute: 2.3 10*3/uL (ref 1.4–7.0)
Neutrophils: 45 %
Platelets: 259 10*3/uL (ref 150–450)
RBC: 3.65 x10E6/uL — ABNORMAL LOW (ref 3.77–5.28)
RDW: 12.5 % (ref 11.7–15.4)
WBC: 5 10*3/uL (ref 3.4–10.8)

## 2024-06-15 LAB — TSH+FREE T4
Free T4: 0.93 ng/dL (ref 0.82–1.77)
TSH: 0.579 u[IU]/mL (ref 0.450–4.500)

## 2024-06-15 LAB — HEMOGLOBIN A1C
Est. average glucose Bld gHb Est-mCnc: 128 mg/dL
Hgb A1c MFr Bld: 6.1 % — ABNORMAL HIGH (ref 4.8–5.6)

## 2024-06-15 LAB — LIPID PANEL
Chol/HDL Ratio: 2.7 ratio (ref 0.0–4.4)
Cholesterol, Total: 186 mg/dL (ref 100–199)
HDL: 69 mg/dL (ref >39)
LDL Chol Calc (NIH): 104 mg/dL — ABNORMAL HIGH (ref 0–99)
Triglycerides: 72 mg/dL (ref 0–149)
VLDL Cholesterol Cal: 13 mg/dL (ref 5–40)

## 2024-06-15 LAB — VITAMIN D 25 HYDROXY (VIT D DEFICIENCY, FRACTURES): Vit D, 25-Hydroxy: 28.2 ng/mL — ABNORMAL LOW (ref 30.0–100.0)

## 2024-06-30 ENCOUNTER — Ambulatory Visit: Payer: Self-pay | Admitting: Family Medicine

## 2024-07-02 NOTE — Progress Notes (Signed)
 GI Office Note    Referring Provider: Zarwolo, Gloria, FNP Primary Care Physician:  Zarwolo, Gloria, FNP  Primary Gastroenterologist: Carlin POUR. Cindie, DO  Chief Complaint   Chief Complaint  Patient presents with   Internal Hemorrhoid    Pt arrives due to internal hemorrhoids and bleeding. Pt having bright red blood at times. Weight loss surgery in 2012 and was going to bathroom daily but now she is not going as often as normal. Pt also having burning sensation after using bathroom. Pt is using suppositories. Hemorrhoid issue has been going on for over 5 years. Pt had banding in [place] but had bad experience.    History of Present Illness   Sabrina Roberts is a 53 y.o. female presenting today at the request of Zarwolo, Gloria, FNP for hemorrhoids/rectal bleeding.   Colonoscopy Nov 2018 by Dr. Leigh: - The examined portion of the ileum was normal.  - One 4 mm polyp in the cecum (sessile serrated) - Diverticulosis in the cecum.  - Internal hemorrhoids. -  The examination was otherwise normal.  - Biopsies were taken with a cold forceps from the right colon, left colon and transverse colon for evaluation of microscopic colitis. - Hemorrhoids suspected to be cause of bleeding. -Advised trial of colestid  in pathology neg and continue imodium.   Underwent hemorrhoid banding with [facility] device December 2018 with Dr. Leigh. Reported increased discomfort and distress with the procedure.   Colonoscopy Nov 2024 by Dr. Leigh: - The perianal and digital rectal examinations were normal.  - Scattered small- mouthed diverticula were found in the entire colon.  - Internal hemorrhoids were found during retroflexion.  - The exam was otherwise without abnormality. - Repeat in 5 years.   Visit with PCP 06/08/24 reported flare up of hemorrhoids with rectal bleeding at times. Using hydrocortisone rectal cream for relief. She was advised to continue suppositories and gave GI referral.    Today:  Reports that initially after weight loss surgery in 2012 she was having daily bowel movements though now recently has been suffering from some mild constipation.  Having intermittent bright red blood per rectum and has burning sensation after bowel movements, uses suppositories.  States hemorrhoid issues have been ongoing for about 5 years or more.  She reports that she had bad experience after 1 hemorrhoid banding procedure in [place].  Burning having a lot after BM. Couple weeks going less than usual. Her normal was 4-5 times daily and was hurting frequently. Having a lot of bloating since all this as well. Usually stools are runny and that is normal for her but recently it is very thick/pasty and that burns more. Having less in regards to amount as well. Sometimes has itching with the thicker stools. No knifelike pain.   Bleeding has been occurring for 1.5 months. Would have 3 episodes of bleeding through the day always with a BM when she would go her normal 4-5 times a day. Bleeding usually occurs in the stool and mostly with the toilet tissue. Since the harder stools it has been a little less bleeding. Tried miralax (packs BID for a little over a week) and tried suppository laxative and that did not do anything either.    The suppository she has been using preparation H suppositories. Tucks pads help some but not really. Has been passing gas some but not her normal.   Has been scared about doing the banding again.   Usually after meals she commonly has BMs.   She  has had some changes in her pain medications as well. Has been going to the gym and eating all her normal foods and the weight is gaining. Uncomfortable to wear her waist trainer.   Has tried fiber but felt more bloated in the past.   She is off hydrocodone  - has been having trouble. Patch does not help her. She reports  she is due to get some injection in her back to help with her pain.   Tried castor oil about a  week and a half ago and did it 2 days in a row.   Wt Readings from Last 5 Encounters:  07/03/24 225 lb (102.1 kg)  06/08/24 214 lb 6.4 oz (97.3 kg)  01/13/24 208 lb 3.2 oz (94.4 kg)  11/12/23 215 lb 8 oz (97.8 kg)  11/04/23 218 lb 1.3 oz (98.9 kg)    Current Outpatient Medications  Medication Sig Dispense Refill   atorvastatin  (LIPITOR) 10 MG tablet Take 1 tablet (10 mg total) by mouth daily. 90 tablet 1   buprenorphine (BUTRANS) 5 MCG/HR PTWK Place 1 patch onto the skin once a week.     cyclobenzaprine  (FLEXERIL ) 5 MG tablet Take 5 mg by mouth 3 (three) times daily as needed.     diazepam  (VALIUM ) 5 MG tablet Take 1 tablet (5 mg total) by mouth every 12 (twelve) hours as needed for anxiety. 5 tablet 0   Dulaglutide  (TRULICITY ) 3 MG/0.5ML SOPN Inject 3 mg as directed once a week. 2 mL 2   DULoxetine  (CYMBALTA ) 60 MG capsule Take 2 capsules (120 mg total) by mouth daily. 60 capsule 5   famotidine  (PEPCID ) 20 MG tablet Take 1 tablet (20 mg total) by mouth 2 (two) times daily as needed for heartburn or indigestion. 30 tablet 0   FEROSUL 325 (65 Fe) MG tablet Take 1 tablet by mouth once daily with breakfast 30 tablet 0   folic acid  (FOLVITE ) 1 MG tablet Take 1 mg by mouth daily.     hydrOXYzine  (VISTARIL ) 25 MG capsule Take 1 capsule (25 mg total) by mouth at bedtime as needed. 30 capsule 1   LYRICA  150 MG capsule      Multiple Vitamin (MULTIVITAMIN) tablet Take 1 tablet by mouth daily.      QUEtiapine  (SEROQUEL ) 25 MG tablet Take 2 pills nightly for insomnia. Take 1 pill twice daily as needed for panic. 120 tablet 5   rOPINIRole (REQUIP) 0.5 MG tablet Take 0.5-1.5 mg by mouth at bedtime.     Vitamin D , Ergocalciferol , (DRISDOL ) 1.25 MG (50000 UNIT) CAPS capsule Take 1 capsule (50,000 Units total) by mouth once a week. 20 capsule 1   Blood Glucose Monitoring Suppl (BLOOD GLUCOSE SYSTEM PAK) KIT Please dispense based on patient and insurance preference.  Use as directed to monitor FSBS 1-2  times daily.  Dx E11.9 (Patient not taking: Reported on 07/03/2024) 1 kit 1   glucose blood test strip Please dispense based on patient and insurance preference.  Use as directed to monitor FSBS 1-2 times daily.  Dx E11.9 (Patient not taking: Reported on 07/03/2024) 100 each 11   Lancets MISC Please dispense based on patient and insurance preference.  Use as directed to monitor FSBS 1-2 times daily.  Dx E11.9 (Patient not taking: Reported on 07/03/2024) 100 each 11   naloxone (NARCAN) nasal spray 4 mg/0.1 mL Place 1 spray into the nose once. (Patient not taking: Reported on 07/03/2024)     No current facility-administered medications for this visit.  Past Medical History:  Diagnosis Date   Anemia    Anxiety    Arthritis    in back   Depression    Diabetes mellitus Type 2    DJD (degenerative joint disease)    herniated disc lower back   Gestational hypertension    Hyperlipidemia    Obesity    Sciatica    in back on left side   Sedative hypnotic dependence: Ambien  03/22/2023   Wears contact lenses     Past Surgical History:  Procedure Laterality Date   BUNIONECTOMY Right 11/12/2023   Procedure: IVEN EDWARDS;  Surgeon: Dulcie Gretta POUR, DPM;  Location: [facility];  Service: Podiatry;  Laterality: Right;   CARPAL TUNNEL RELEASE Right 10/01/2016   Procedure: RIGHT CARPAL TUNNEL RELEASE;  Surgeon: Taft FORBES Minerva, MD;  Location: [facility];  Service: Orthopedics;  Laterality: Right;   CARPAL TUNNEL RELEASE Left 10/22/2016   Procedure: CARPAL TUNNEL RELEASE;  Surgeon: Taft FORBES Minerva, MD;  Location: [facility];  Service: Orthopedics;  Laterality: Left;   CESAREAN SECTION  2002   CHOLECYSTECTOMY  dec 2012   GASTRIC BYPASS  dec 2012   KNEE ARTHROSCOPY Right    METATARSAL OSTEOTOMY Left 05/14/2023   Procedure: METATARSAL OSTEOTOMY;  Surgeon: Dulcie Gretta POUR, DPM;  Location: [facility];  Service: Podiatry;  Laterality: Left;  POP BLOCK    Family  History  Problem Relation Age of Onset   Hypertension Mother    Depression Mother    Diabetes Mother    Irritable bowel syndrome Mother    Heart disease Mother    Stroke Mother    Stomach cancer Father    Hypertension Father    Hyperlipidemia Father    Prostate cancer Father    Colon polyps Father    Heart disease Father    Kidney Stones Father    Diverticulitis Father    Colon cancer Father    Breast cancer Maternal Aunt    Colon polyps Paternal Aunt        x 3   Stomach cancer Paternal Uncle    Diabetes Maternal Grandmother    Breast cancer Paternal Grandmother    Colon cancer Paternal Grandfather    Colon cancer Other        Paternal Great Uncles x 4   Esophageal cancer Neg Hx    Rectal cancer Neg Hx     Allergies as of 07/03/2024 - Review Complete 07/03/2024  Allergen Reaction Noted   Actos [pioglitazone hydrochloride] Anaphylaxis, Hives, and Other (See Comments) 08/25/2011   Latex Rash 10/01/2016    Social History   Socioeconomic History   Marital status: Widowed    Spouse name: Not on file   Number of children: Not on file   Years of education: Not on file   Highest education level: Not on file  Occupational History   Not on file  Tobacco Use   Smoking status: Never    Passive exposure: Current   Smokeless tobacco: Never  Vaping Use   Vaping status: Never Used  Substance and Sexual Activity   Alcohol use: Yes    Comment: 1 mixed drink every week or every month   Drug use: No   Sexual activity: Yes    Partners: Male    Birth control/protection: None  Other Topics Concern   Not on file  Social History Narrative   Not on file   Social Drivers of Health   Financial Resource Strain: Not  on file  Food Insecurity: Not on file  Transportation Needs: Not on file  Physical Activity: Not on file  Stress: Not on file  Social Connections: Not on file  Intimate Partner Violence: Not on file     Review of Systems   Gen: Denies any fever, chills,  fatigue, weight loss, lack of appetite.  CV: Denies chest pain, heart palpitations, peripheral edema, syncope.  Resp: Denies shortness of breath at rest or with exertion. Denies wheezing or cough.  GI: see HPI GU : Denies urinary burning, urinary frequency, urinary hesitancy MS: Denies joint pain, muscle weakness, cramps, or limitation of movement.  Derm: Denies rash, itching, dry skin Psych: Denies depression, anxiety, memory loss, and confusion Heme: Denies bruising, bleeding, and enlarged lymph nodes.  Physical Exam   BP 116/74   Pulse 74   Temp 98.4 F (36.9 C)   Ht 5' 4 (1.626 m)   Wt 225 lb (102.1 kg)   LMP  (LMP Unknown)   BMI 38.62 kg/m   General:   Alert and oriented. Pleasant and cooperative. Well-nourished and well-developed.  Head:  Normocephalic and atraumatic. Eyes:  Without icterus, sclera clear and conjunctiva pink.  Ears:  Normal auditory acuity. Mouth:  No deformity or lesions, oral mucosa pink.  Abdomen:  +BS, soft, non-tender and non-distended. No HSM noted. No guarding or rebound. No masses appreciated.  Rectal:  deferred  Msk:  Symmetrical without gross deformities. Normal posture. Neurologic:  Alert and  oriented x4;  grossly normal neurologically. Skin:  Intact without significant lesions or rashes. Psych:  Alert and cooperative. Normal mood and affect.  Assessment   Sabrina Roberts is a 54 y.o. female presenting today with decreased bowel function and hemorrhoid flares.   Hemorrhoids, rectal bleeding: Bleeding occurring with stools on toilet tissue.  Typically has some looser stools about 4-5 times a day she reports although recently has been having harder stools or mushy/pasty stools as noted below.  Denies any significant rectal pain but more so having itching.  Has been having bleeding for about 1-1/2 months.  Has tried Preparation H suppositories as well as an external Tucks pads however this does not help with internal symptoms.  Has had documented  issue with banding in the past with extreme discomfort however this was in 2018.  Last colonoscopy in November 2024 with internal hemorrhoids and pancolonic diverticulosis. Discussed using Preparation H with lidocaine  or lidocaine  alone for rectal discomfort/burning.  We rediscussed hemorrhoid banding and potentially why she had complication the last time.  Discussed using lidocaine  for banding if she would like to retry this given this the best alternative for internal hemorrhoids other than surgery.  Continue to suspect that rectal bleeding is secondary to hemorrhoids.  Discussed need to avoid straining.  Constipation: More recently has been having harder stools but recently has been having pasty/thick stools which are burning her hemorrhoids as above.  Her frequency has also decreased and she is having bloating and some reported abdominal distention.  Continues to report some bowel movements after meals however very small amounts and she is feeling the need to strain.  Had tried castor oil about a week and a half ago for 2 days in a row without any relief.  Was previously on hydrocodone  but currently using a patch which is not helping her back pain.  Has also tried fiber in the past but this worsens her bloating so she stopped.  Has been trying to increase her water intake as well  as eat a healthy diet however reports she has continued to gain weight as she has noticed her waist trainer is getting tighter.  She tried MiraLAX twice daily for a week as well as a suppository laxative without productive results.  Discussed using Gas-X or Phazyme for relief of bloating.  Continue to encourage fiber in the diet as well as MiraLAX twice daily.  We discussed pelvic floor PT and she is going to discuss this with her personal trainer at the gym.  We discussed possible referral to physical therapy if needed.  Also provided some samples of Allegra and counseled on potential side effects.  PLAN   Preparation H with  lidocaine  or lidocaine  alone.  Ibsrela samples. Counseled on washout period.  Gas X of Phazyme Fiber daily.  Miralax BID Discussed hemorrhoid banding - would like to have it done with lidocaine  Pelvic floor PT.  Follow up for banding at patient's preference.  Charmaine Melia, MSN, FNP-BC, AGACNP-BC [facility]

## 2024-07-03 ENCOUNTER — Encounter: Payer: Self-pay | Admitting: Gastroenterology

## 2024-07-03 ENCOUNTER — Ambulatory Visit (INDEPENDENT_AMBULATORY_CARE_PROVIDER_SITE_OTHER): Payer: Medicare (Managed Care) | Admitting: Gastroenterology

## 2024-07-03 VITALS — BP 116/74 | HR 74 | Temp 98.4°F | Ht 64.0 in | Wt 225.0 lb

## 2024-07-03 DIAGNOSIS — K648 Other hemorrhoids: Secondary | ICD-10-CM

## 2024-07-03 DIAGNOSIS — K5900 Constipation, unspecified: Secondary | ICD-10-CM | POA: Diagnosis not present

## 2024-07-03 DIAGNOSIS — K649 Unspecified hemorrhoids: Secondary | ICD-10-CM | POA: Diagnosis not present

## 2024-07-03 DIAGNOSIS — K625 Hemorrhage of anus and rectum: Secondary | ICD-10-CM | POA: Diagnosis not present

## 2024-07-03 DIAGNOSIS — Z8719 Personal history of other diseases of the digestive system: Secondary | ICD-10-CM

## 2024-07-03 NOTE — Patient Instructions (Addendum)
 For constipation: Start gentle with metamucil or benefiber 2-3 teaspoons once daily in at least 8 oz water Ibsrela samples provided today - take 1 tablet twice daily with or without food. You could have some increased diarrhea initially but if you feel like it is helpful we could continue it.  Continue miralax twice daily if you are able to tolerate the bloating it gives you. Only do this if Ibsrela samples are not helpful.  May use enema if you are having a hard time going after about 5 days.  For bloating: May use gas x before meals or Phazyme 1-2 times daily. The maximum dose of simethicone  (main ingredient) is 500 mg daily.   For hemorrhoids: Pick up over the counter rectal lidocaine  5% and apply 2-3 times daily as needed along with your preparation H suppositories or you can get the combination preparation H with lidocaine  cream.   When you are seeing your trainer mention to him that you would like to know about any pelvic floor strengthening exercises. If he is unable to help we can consider PT if needed.   Follow up for repeat hemorrhoid banding if you decide you'd like to try it.   It was a pleasure to see you today. I want to create trusting relationships with patients. If you receive a survey regarding your visit,  I greatly appreciate you taking time to fill this out on paper or through your MyChart. I value your feedback.  Charmaine Melia, MSN, FNP-BC, AGACNP-BC [facility]

## 2024-07-14 ENCOUNTER — Encounter: Payer: Self-pay | Admitting: *Deleted

## 2024-07-24 ENCOUNTER — Encounter: Payer: Medicare (Managed Care) | Admitting: Gastroenterology

## 2024-08-07 ENCOUNTER — Telehealth (HOSPITAL_COMMUNITY): Payer: Self-pay

## 2024-08-07 NOTE — Telephone Encounter (Signed)
 Medication refill - Call with patient to follow-up on a fax refill request our office received for Quetiapine  25 mg, 2 a day. Pt stated she had not set up a visit with another provider as of yet since Dr. Barbra no longer with our practice. Patient agreed to contact her primary care provider to see if they would take over this prescription and also provided the [facility] practice phone number to assist patient to trying to get a new psychiatrist if interested.  Patient stated understanding and will follow up with her PCP for medication refill request and our [facility] practice if wants to later see another psychiatrist.  Faxed back this to patient's [facility] pharmacy so they would be aware.

## 2024-08-14 ENCOUNTER — Other Ambulatory Visit: Payer: Self-pay | Admitting: Family Medicine

## 2024-08-14 DIAGNOSIS — F411 Generalized anxiety disorder: Secondary | ICD-10-CM

## 2024-08-14 DIAGNOSIS — F5104 Psychophysiologic insomnia: Secondary | ICD-10-CM

## 2024-08-14 DIAGNOSIS — E119 Type 2 diabetes mellitus without complications: Secondary | ICD-10-CM

## 2024-08-14 DIAGNOSIS — F4001 Agoraphobia with panic disorder: Secondary | ICD-10-CM

## 2024-08-14 NOTE — Progress Notes (Unsigned)
   [facility] Banding Procedure Note:   Sabrina Roberts is a 53 y.o. female presenting today for consideration of hemorrhoid banding. Last colonoscopy Nov 2024 with pancolonic diverticulosis and internal hemorrhoids.  Latex Allergy: YES  Interval History: Has previously reported significant discomfort with banding in the past (2018). Having burning after Bms, usually 4-5 BM daily but recently more pasty in nature. No knifelike pain, occasional itching present. Recently using preparation H suppositories and tucks pads. Likes the ibsrela samples. Had some burning starting yesterday but no bleeding in the last 3 weeks.   The patient presents with symptomatic grade 1 hemorrhoids, unresponsive to maximal medical therapy, requesting rubber band ligation of his/her hemorrhoidal disease. All risks, benefits, and alternative forms of therapy were described and informed consent was obtained.  In the left lateral decubitus position  anoscopic examination revealed grade 1 hemorrhoids in the right anterior and right posterior positions. Unable to view left lateral very well on exam.   The decision was made to band the right anterior internal hemorrhoid, and the [facility] O'Regan System was used to perform band ligation with LATEX FREE BAND without complication. Digital anorectal examination was then performed to assure proper positioning of the band, and to adjust the banded tissue as required. The patient was discharged home without pain or other issues. Dietary and behavioral recommendations were given and Ibsrela prescription given, along with follow-up instructions. The patient will return in 2-3 weeks for additional banding as required.  No complications were encountered and the patient tolerated the procedure well.    Charmaine Melia, MSN, FNP-BC, AGACNP-BC [facility]

## 2024-08-14 NOTE — Telephone Encounter (Unsigned)
 Copied from CRM [id number]. Topic: Clinical - Medication Refill >> Aug 14, 2024  1:39 PM Lauren C wrote: Medication: zolpidem  (AMBIEN ) 10 MG tablet QUEtiapine  (SEROQUEL ) 50 MG tablet &  diazepam  (VALIUM ) 5 MG tablet (current prescriber left practice, she was told to see if pcp would refill this) Dulaglutide  (TRULICITY ) 3 MG/0.5ML SOPN  Has the patient contacted their pharmacy? No (Agent: If no, request that the patient contact the pharmacy for the refill. If patient does not wish to contact the pharmacy document the reason why and proceed with request.) (Agent: If yes, when and what did the pharmacy advise?)  This is the patient's preferred pharmacy:  [facility] [address] [place] - [address] [place] [address] Phone: [phone] Fax: [phone]  Is this the correct pharmacy for this prescription? Yes If no, delete pharmacy and type the correct one.   Has the prescription been filled recently? Only one  Is the patient out of the medication? Yes  Has the patient been seen for an appointment in the last year OR does the patient have an upcoming appointment? Yes  Can we respond through MyChart? Yes  Agent: Please be advised that Rx refills may take up to 3 business days. We ask that you follow-up with your pharmacy.

## 2024-08-15 ENCOUNTER — Other Ambulatory Visit: Payer: Self-pay | Admitting: Family Medicine

## 2024-08-15 ENCOUNTER — Encounter: Payer: Self-pay | Admitting: Gastroenterology

## 2024-08-15 ENCOUNTER — Ambulatory Visit (INDEPENDENT_AMBULATORY_CARE_PROVIDER_SITE_OTHER): Payer: Medicare (Managed Care) | Admitting: Gastroenterology

## 2024-08-15 VITALS — BP 126/80 | HR 64 | Temp 98.6°F | Ht 64.0 in | Wt 217.8 lb

## 2024-08-15 DIAGNOSIS — K5900 Constipation, unspecified: Secondary | ICD-10-CM

## 2024-08-15 DIAGNOSIS — K640 First degree hemorrhoids: Secondary | ICD-10-CM

## 2024-08-15 DIAGNOSIS — E119 Type 2 diabetes mellitus without complications: Secondary | ICD-10-CM

## 2024-08-15 DIAGNOSIS — G479 Sleep disorder, unspecified: Secondary | ICD-10-CM

## 2024-08-15 MED ORDER — TRULICITY 4.5 MG/0.5ML ~~LOC~~ SOAJ: 4.5000 mg | SUBCUTANEOUS | 0 refills | Status: DC

## 2024-08-15 MED ORDER — TENAPANOR HCL 50 MG PO TABS: 50.0000 mg | ORAL_TABLET | Freq: Two times a day (BID) | ORAL | 3 refills | Status: DC

## 2024-08-15 NOTE — Patient Instructions (Signed)
 Continue to avoid straining. Limit toilet time to 2-3 minutes at the most.   Avoid constipation. Take 2 tablespoons of natural wheat bran, natural oat bran, flax, Benefiber or any over the counter fiber supplement and increase your water intake to 7-8 glasses daily.  I have sent Ibsrella prescription to the pharmacy for you.   Occasionally, you may have more bleeding than usual after the banding procedure. This is often from the untreated hemorrhoids rather than the treated one. Don't be concerned if there is a tablespoon or so of blood. If there is more blood than this, lie flat with your bottom higher than your head and apply an ice pack to the area. If the bleeding does not stop within a half an hour or if you feel faint, have severe pain, chills, fever or difficulty passing urine (very rare) or other problems, you should call us  at [phone] or report to the nearest emergency room. Please call me with any concerns!  The procedure you have had should have been relatively painless since the banding of the area involved does not have nerve endings and there is no pain sensation. The rubber band cuts off the blood supply to the hemorrhoid and the band may fall off as soon as 48 hours after the banding (the band may occasionally be seen in the toilet bowl following a bowel movement). You may notice a temporary feeling of fullness in the rectum which should respond adequately to plain Tylenol  or Motrin.  I will see you back in 2-3 weeks for additional banding.   Charmaine Melia, MSN, FNP-BC, AGACNP-BC [facility]

## 2024-08-23 ENCOUNTER — Other Ambulatory Visit: Payer: Self-pay | Admitting: Gastroenterology

## 2024-08-23 ENCOUNTER — Telehealth: Payer: Self-pay | Admitting: *Deleted

## 2024-08-23 DIAGNOSIS — K581 Irritable bowel syndrome with constipation: Secondary | ICD-10-CM

## 2024-08-23 DIAGNOSIS — K5900 Constipation, unspecified: Secondary | ICD-10-CM

## 2024-08-23 MED ORDER — TENAPANOR HCL 50 MG PO TABS: 50.0000 mg | ORAL_TABLET | Freq: Two times a day (BID) | ORAL | 3 refills | Status: DC

## 2024-08-23 NOTE — Telephone Encounter (Signed)
 Received a denial letter for ibsrela. Scanned a copy in pt's chart and placed a copy on your desk.

## 2024-08-24 ENCOUNTER — Encounter: Payer: Self-pay | Admitting: Gastroenterology

## 2024-08-29 NOTE — Telephone Encounter (Signed)
 noted

## 2024-09-04 NOTE — Telephone Encounter (Signed)
 Appeal form for Sabrina Roberts is on your desk to be signed.

## 2024-09-06 ENCOUNTER — Other Ambulatory Visit: Payer: Self-pay

## 2024-09-06 MED ORDER — LINACLOTIDE 72 MCG PO CAPS: 72.0000 ug | ORAL_CAPSULE | Freq: Every day | ORAL | Status: AC

## 2024-09-06 NOTE — Telephone Encounter (Signed)
 Spoke with pt and she stated that none of them sounded familiar that she doesn't think she has tried any of them.

## 2024-09-06 NOTE — Telephone Encounter (Signed)
 Pt was made aware and verbalized understanding. Samples were placed up front for pick up.

## 2024-09-06 NOTE — Progress Notes (Signed)
 Medication Samples have been provided to the patient.  Drug name: Linzess        Strength: 72mcg        Qty: 2 LOT: [id number]  Exp.Date: 02/2026  Dosing instructions: Take one capsule daily 30 mins before breakfast  The patient has been instructed regarding the correct time, dose, and frequency of taking this medication, including desired effects and most common side effects.   Sabrina Roberts 11:21 AM 09/06/2024

## 2024-09-07 ENCOUNTER — Other Ambulatory Visit: Payer: Self-pay | Admitting: Family Medicine

## 2024-09-07 DIAGNOSIS — G479 Sleep disorder, unspecified: Secondary | ICD-10-CM

## 2024-09-07 DIAGNOSIS — E119 Type 2 diabetes mellitus without complications: Secondary | ICD-10-CM

## 2024-09-25 NOTE — Progress Notes (Deleted)
   [facility] Banding Procedure Note:   Sabrina KIERSTEAD is a 53 y.o. female presenting today for consideration of hemorrhoid banding. Last colonoscopy ***.  Latex Allergy: YES  Interval History: ***Underwent banding of right anterior column last viist 08/15/24.  The patient presents with symptomatic grade 1 hemorrhoids, unresponsive to maximal medical therapy, requesting rubber band ligation of his/her hemorrhoidal disease. All risks, benefits, and alternative forms of therapy were described and informed consent was obtained.  The decision was made to band the *** internal hemorrhoid, and the [facility] O'Regan System was used to perform band ligation with LATEX FREE BAND without complication. Digital anorectal examination was then performed to assure proper positioning of the band, and to adjust the banded tissue as required. The patient was discharged home without pain or other issues. Dietary and behavioral recommendations were given and (if necessary prescriptions were given), along with follow-up instructions. The patient will return in *** for followup and possible additional banding as required.  No complications were encountered and the patient tolerated the procedure well.    Charmaine Melia, MSN, FNP-BC, AGACNP-BC [facility]

## 2024-09-26 ENCOUNTER — Encounter: Payer: Medicare (Managed Care) | Admitting: Gastroenterology

## 2024-09-27 ENCOUNTER — Encounter: Payer: Self-pay | Admitting: Gastroenterology

## 2024-10-02 ENCOUNTER — Other Ambulatory Visit: Payer: Self-pay | Admitting: Family Medicine

## 2024-10-02 DIAGNOSIS — E119 Type 2 diabetes mellitus without complications: Secondary | ICD-10-CM

## 2024-10-04 ENCOUNTER — Telehealth: Payer: Self-pay

## 2024-10-04 NOTE — Telephone Encounter (Signed)
 Copied from CRM [id number]. Topic: General - Other >> Oct 04, 2024  9:04 AM Treva T wrote: Reason for CRM: Received call form Angela with [facility] with a clinical recommendation.   Per caller, Jon, states patient has been identified that she needs a statin medication, due to cholesterol level. Inquiring if provider would consider a statin medication for the patient, and discuss with patient.   Can be reached at [phone] to discuss further.

## 2024-10-05 ENCOUNTER — Other Ambulatory Visit: Payer: Self-pay | Admitting: Family Medicine

## 2024-10-05 NOTE — Progress Notes (Signed)
 The 10-year ASCVD risk score (Arnett DK, et al., 2019) is: 4.3%   Values used to calculate the score:     Age: 53 years     Clincally relevant sex: Female     Is Non-Hispanic African American: Yes     Diabetic: Yes     Tobacco smoker: No     Systolic Blood Pressure: 126 mmHg     Is BP treated: No     HDL Cholesterol: 69 mg/dL     Total Cholesterol: 186 mg/dL

## 2024-10-08 ENCOUNTER — Other Ambulatory Visit: Payer: Self-pay | Admitting: Family Medicine

## 2024-10-08 DIAGNOSIS — G479 Sleep disorder, unspecified: Secondary | ICD-10-CM

## 2024-10-09 ENCOUNTER — Ambulatory Visit: Payer: Medicare (Managed Care) | Admitting: Family Medicine

## 2024-10-24 ENCOUNTER — Telehealth: Payer: Self-pay | Admitting: Family Medicine

## 2024-10-24 ENCOUNTER — Encounter: Payer: Self-pay | Admitting: Family Medicine

## 2024-10-27 ENCOUNTER — Other Ambulatory Visit: Payer: Self-pay | Admitting: Family Medicine

## 2024-10-27 DIAGNOSIS — E119 Type 2 diabetes mellitus without complications: Secondary | ICD-10-CM

## 2024-11-04 ENCOUNTER — Other Ambulatory Visit: Payer: Self-pay | Admitting: Family Medicine

## 2024-11-04 DIAGNOSIS — G479 Sleep disorder, unspecified: Secondary | ICD-10-CM

## 2024-11-07 ENCOUNTER — Telehealth: Payer: Self-pay | Admitting: Family Medicine

## 2024-11-07 NOTE — Telephone Encounter (Signed)
 Called patient left a voicemail to call office to schedule an in office visit for the dermatologist referral.

## 2024-11-07 NOTE — Telephone Encounter (Signed)
 Please advise.  Does patient need another appointment with provider?  Copied from CRM [id number]. Topic: Referral - Question >> Nov 07, 2024 11:09 AM Treva T wrote: Reason for CRM: Received call from [place] at [facility] office in [place].  Calling requesting a referral be sent to office for patient being seen for Alopecia.  Patient has an upcoming appointment on 11/14/24, and no referral has been received.  States will need referral prior to seeing patient.   Can be reached back if need to discuss further at [phone], fax, [phone].

## 2024-11-28 ENCOUNTER — Other Ambulatory Visit: Payer: Self-pay | Admitting: Family Medicine

## 2024-11-28 DIAGNOSIS — E559 Vitamin D deficiency, unspecified: Secondary | ICD-10-CM

## 2024-12-02 ENCOUNTER — Other Ambulatory Visit: Payer: Self-pay | Admitting: Family Medicine

## 2024-12-02 DIAGNOSIS — G479 Sleep disorder, unspecified: Secondary | ICD-10-CM

## 2024-12-06 ENCOUNTER — Other Ambulatory Visit: Payer: Self-pay

## 2024-12-06 ENCOUNTER — Ambulatory Visit: Payer: Medicare (Managed Care)

## 2024-12-06 ENCOUNTER — Other Ambulatory Visit (HOSPITAL_COMMUNITY): Payer: Self-pay

## 2024-12-06 VITALS — BP 108/60 | HR 72 | Resp 12 | Ht 64.0 in | Wt 206.8 lb

## 2024-12-06 DIAGNOSIS — Z0000 Encounter for general adult medical examination without abnormal findings: Secondary | ICD-10-CM

## 2024-12-06 DIAGNOSIS — Z0001 Encounter for general adult medical examination with abnormal findings: Secondary | ICD-10-CM | POA: Diagnosis not present

## 2024-12-06 DIAGNOSIS — E119 Type 2 diabetes mellitus without complications: Secondary | ICD-10-CM | POA: Diagnosis not present

## 2024-12-06 DIAGNOSIS — Z23 Encounter for immunization: Secondary | ICD-10-CM

## 2024-12-06 DIAGNOSIS — Z1231 Encounter for screening mammogram for malignant neoplasm of breast: Secondary | ICD-10-CM

## 2024-12-06 DIAGNOSIS — Z7985 Long-term (current) use of injectable non-insulin antidiabetic drugs: Secondary | ICD-10-CM

## 2024-12-06 MED ORDER — BLOOD GLUCOSE TEST VI STRP
1.0000 | ORAL_STRIP | Freq: Three times a day (TID) | 0 refills | Status: DC
  Filled 2024-12-06: qty 100, 34d supply, fill #0

## 2024-12-06 MED ORDER — ACCU-CHEK GUIDE ME W/DEVICE KIT
1.0000 | PACK | Freq: Three times a day (TID) | 0 refills | Status: AC
  Filled 2024-12-06: qty 1, 90d supply, fill #0

## 2024-12-06 MED ORDER — LANCETS MISC
1.0000 | 0 refills | Status: AC
  Filled 2024-12-06: qty 100, 33d supply, fill #0

## 2024-12-06 MED ORDER — LANCET DEVICE MISC
1.0000 | Freq: Three times a day (TID) | 0 refills | Status: AC
  Filled 2024-12-06: qty 1, 30d supply, fill #0

## 2024-12-06 NOTE — Progress Notes (Signed)
 Chief Complaint  Patient presents with   Medicare Wellness     Subjective:   Sabrina Roberts is a 53 y.o. female who presents for a Medicare Annual Wellness Visit.  Visit info / Clinical Intake: Medicare Wellness Visit Type:: Initial Annual Wellness Visit Persons participating in visit and providing information:: patient Medicare Wellness Visit Mode:: In-person (required for WTM) Interpreter Needed?: No Pre-visit prep was completed: yes AWV questionnaire completed by patient prior to visit?: no Living arrangements:: (!) lives alone Patient's Overall Health Status Rating: very good Typical amount of pain: none Does pain affect daily life?: no Are you currently prescribed opioids?: no  Dietary Habits and Nutritional Risks How many meals a day?: 3 Eats fruit and vegetables daily?: yes Most meals are obtained by: preparing own meals In the last 2 weeks, have you had any of the following?: none Diabetic:: (!) yes Any non-healing wounds?: no How often do you check your BS?: 0 (patient advised to check blood sugars at minimum once daily fasting in the morning. encouraged her to place meter beside bed as a visual reminder to check sugar each morning.) Would you like to be referred to a Nutritionist or for Diabetic Management? : no  Functional Status Activities of Daily Living (to include ambulation/medication): Independent Ambulation: Independent Medication Administration: Independent Home Management (perform basic housework or laundry): Independent Manage your own finances?: yes Primary transportation is: driving Concerns about vision?: no *vision screening is required for WTM* Concerns about hearing?: no  Fall Screening Falls in the past year?: 0 Number of falls in past year: 0 Was there an injury with Fall?: 0 Fall Risk Category Calculator: 0 Patient Fall Risk Level: Low Fall Risk  Fall Risk Patient at Risk for Falls Due to: No Fall Risks Fall risk Follow up: Falls  evaluation completed; Education provided; Falls prevention discussed  Home and Transportation Safety: All rugs have non-skid backing?: yes All stairs or steps have railings?: yes Grab bars in the bathtub or shower?: yes Have non-skid surface in bathtub or shower?: yes Good home lighting?: yes Regular seat belt use?: yes Hospital stays in the last year:: no  Cognitive Assessment Difficulty concentrating, remembering, or making decisions? : no Will 6CIT or Mini Cog be Completed: no 6CIT or Mini Cog Declined: patient alert, oriented, able to answer questions appropriately and recall recent events  Advance Directives (For Healthcare) Does Patient Have a Medical Advance Directive?: No Would patient like information on creating a medical advance directive?: Yes (MAU/Ambulatory/Procedural Areas - Information given)  Reviewed/Updated  Reviewed/Updated: Reviewed All (Medical, Surgical, Family, Medications, Allergies, Care Teams, Patient Goals)    Allergies (verified) Actos [pioglitazone hydrochloride] and Latex   Current Medications (verified) Outpatient Encounter Medications as of 12/06/2024  Medication Sig   atorvastatin  (LIPITOR) 10 MG tablet Take 1 tablet (10 mg total) by mouth daily.   Blood Glucose Monitoring Suppl DEVI 1 each by Does not apply route in the morning, at noon, and at bedtime. May substitute to any manufacturer covered by patient's insurance.   diazepam  (VALIUM ) 5 MG tablet Take 1 tablet (5 mg total) by mouth every 12 (twelve) hours as needed for anxiety.   DULoxetine  (CYMBALTA ) 60 MG capsule Take 2 capsules (120 mg total) by mouth daily.   famotidine  (PEPCID ) 20 MG tablet Take 1 tablet (20 mg total) by mouth 2 (two) times daily as needed for heartburn or indigestion.   FEROSUL 325 (65 Fe) MG tablet Take 1 tablet by mouth once daily with breakfast  folic acid  (FOLVITE ) 1 MG tablet Take 1 mg by mouth daily.   Glucose Blood (BLOOD GLUCOSE TEST STRIPS) STRP 1 each by In  Vitro route in the morning, at noon, and at bedtime. May substitute to any manufacturer covered by patient's insurance.   hydrOXYzine  (VISTARIL ) 25 MG capsule TAKE 1 CAPSULE BY MOUTH AT BEDTIME AS NEEDED   IBSRELA 50 MG TABS Take 50 mg by mouth in the morning and at bedtime.   Lancet Device MISC 1 each by Does not apply route in the morning, at noon, and at bedtime. May substitute to any manufacturer covered by patient's insurance.   Lancets MISC 1 each by Does not apply route as directed. Dispense based on patient and insurance preference. Use up to four times daily as directed. (FOR ICD-10 E10.9, E11.9).   Multiple Vitamin (MULTIVITAMIN) tablet Take 1 tablet by mouth daily.    QUEtiapine  (SEROQUEL ) 25 MG tablet Take 2 pills nightly for insomnia. Take 1 pill twice daily as needed for panic.   rOPINIRole (REQUIP) 0.5 MG tablet Take 0.5-1.5 mg by mouth at bedtime.   TRULICITY  4.5 MG/0.5ML SOAJ INJECT 4.5 MG SUBCUTANEOUSLY  ONCE A WEEK   Vitamin D , Ergocalciferol , (DRISDOL ) 1.25 MG (50000 UNIT) CAPS capsule Take 1 capsule by mouth once a week   zolpidem  (AMBIEN ) 10 MG tablet Take 10 mg by mouth at bedtime.   buprenorphine (BUTRANS) 5 MCG/HR PTWK Place 1 patch onto the skin once a week. (Patient not taking: Reported on 12/06/2024)   cyclobenzaprine  (FLEXERIL ) 5 MG tablet Take 5 mg by mouth 3 (three) times daily as needed. (Patient not taking: Reported on 12/06/2024)   linaclotide  (LINZESS ) 72 MCG capsule Take 1 capsule (72 mcg total) by mouth daily before breakfast. (Patient not taking: Reported on 12/06/2024)   LYRICA  150 MG capsule  (Patient not taking: Reported on 12/06/2024)   No facility-administered encounter medications on file as of 12/06/2024.    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   Wears contact lenses    Past Surgical History:  Procedure Laterality Date   BUNIONECTOMY Right 11/12/2023   Procedure: IVEN EDWARDS;  Surgeon: Dulcie Gretta POUR, DPM;  Location: [facility] [place];  Service: Podiatry;  Laterality: Right;   CARPAL TUNNEL RELEASE Right 10/01/2016   Procedure: RIGHT CARPAL TUNNEL RELEASE;  Surgeon: Taft FORBES Minerva, MD;  Location: [facility];  Service: Orthopedics;  Laterality: Right;   CARPAL TUNNEL RELEASE Left 10/22/2016   Procedure: CARPAL TUNNEL RELEASE;  Surgeon: Taft FORBES Minerva, MD;  Location: [facility];  Service: Orthopedics;  Laterality: Left;   CESAREAN SECTION  2002   CHOLECYSTECTOMY  dec 2012   GASTRIC BYPASS  dec 2012   KNEE ARTHROSCOPY Right    METATARSAL OSTEOTOMY Left 05/14/2023   Procedure: METATARSAL OSTEOTOMY;  Surgeon: Dulcie Gretta POUR, DPM;  Location: [facility];  Service: Podiatry;  Laterality: Left;  POP BLOCK   Family History  Problem Relation Age of Onset   Hypertension Mother    Depression Mother    Diabetes Mother    Irritable bowel  syndrome Mother    Heart disease Mother    Stroke Mother    Stomach cancer Father    Hypertension Father    Hyperlipidemia Father    Prostate cancer Father    Colon polyps Father    Heart disease Father    Kidney Stones Father    Diverticulitis Father    Colon cancer Father    Breast cancer Maternal Aunt    Colon polyps Paternal Aunt        x 3   Stomach cancer Paternal Uncle    Diabetes Maternal Grandmother    Breast cancer Paternal Grandmother    Colon cancer Paternal Grandfather    Colon cancer Other        Paternal Great Uncles x 4   Esophageal cancer Neg Hx    Rectal cancer Neg Hx    Social History   Occupational History   Not on file  Tobacco Use   Smoking status: Never    Passive exposure: Current   Smokeless tobacco: Never   Vaping Use   Vaping status: Never Used  Substance and Sexual Activity   Alcohol use: Yes    Comment: 1 mixed drink every week or every month   Drug use: No   Sexual activity: Yes    Partners: Male    Birth control/protection: None   Tobacco Counseling Counseling given: Yes  SDOH Screenings   Food Insecurity: No Food Insecurity (12/06/2024)  Housing: Low Risk  (12/06/2024)  Transportation Needs: No Transportation Needs (12/06/2024)  Utilities: Not At Risk (12/06/2024)  Alcohol Screen: Low Risk  (05/03/2020)  Depression (PHQ2-9): Low Risk  (12/06/2024)  Physical Activity: Insufficiently Active (12/06/2024)  Social Connections: Moderately Isolated (12/06/2024)  Stress: No Stress Concern Present (12/06/2024)  Tobacco Use: Medium Risk (12/06/2024)  Health Literacy: Adequate Health Literacy (12/06/2024)   See flowsheets for full screening details  Depression Screen PHQ 2 & 9 Depression Scale- Over the past 2 weeks, how often have you been bothered by any of the following problems? Little interest or pleasure in doing things: 0 Feeling down, depressed, or hopeless (PHQ Adolescent also includes...irritable): 0 PHQ-2 Total Score: 0 Trouble falling or staying asleep, or sleeping too much: 2 Feeling tired or having little energy: 1 Poor appetite or overeating (PHQ Adolescent also includes...weight loss): 1 Feeling bad about yourself - or that you are a failure or have let yourself or your family down: 1 Trouble concentrating on things, such as reading the newspaper or watching television (PHQ Adolescent also includes...like school work): 1 Moving or speaking so slowly that other people could have noticed. Or the opposite - being so fidgety or restless that you have been moving around a lot more than usual: 0 Thoughts that you would be better off dead, or of hurting yourself in some way: 0 PHQ-9 Total Score: 8 If you checked off any problems, how difficult have these problems made it  for you to do your work, take care of things at home, or get along with other people?: Somewhat difficult     Goals Addressed               This Visit's Progress     I want to start adhering to my medication regime (pt-stated)               Objective:    Today's Vitals   12/06/24 0820  BP: 108/60  Pulse: 72  Resp: 12  SpO2: 98%  Weight: 206 lb 12.8 oz (  93.8 kg)  Height: 5' 4 (1.626 m)   Body mass index is 35.5 kg/m.  Hearing/Vision screen Hearing Screening - Comments:: Patient denies any hearing difficulties.   Immunizations and Health Maintenance Health Maintenance  Topic Date Due   Hepatitis B Vaccines 19-59 Average Risk (1 of 3 - 19+ 3-dose series) Never done   Pneumococcal Vaccine: 50+ Years (2 of 2 - PCV) 10/30/2015   COVID-19 Vaccine (3 - Moderna risk series) 02/22/2020   OPHTHALMOLOGY EXAM  06/02/2023   Diabetic kidney evaluation - Urine ACR  01/14/2024   Mammogram  03/14/2024   HEMOGLOBIN A1C  12/14/2024   Diabetic kidney evaluation - eGFR measurement  06/14/2025   FOOT EXAM  12/06/2025   Medicare Annual Wellness (AWV)  12/06/2025   Cervical Cancer Screening (HPV/Pap Cotest)  10/31/2026   Colonoscopy  10/31/2028   DTaP/Tdap/Td (2 - Td or Tdap) 04/04/2033   Influenza Vaccine  Completed   Hepatitis C Screening  Completed   HIV Screening  Completed   Zoster Vaccines- Shingrix   Completed   HPV VACCINES  Aged Out   Meningococcal B Vaccine  Aged Out        Assessment/Plan:  This is a routine wellness examination for Judeth.  Patient Care Team: Bacchus, Meade PEDLAR, FNP as PCP - General (Family Medicine) Patty, A. Robynn, MD (Ophthalmology)  I have personally reviewed and noted the following in the patients chart:   Medical and social history Use of alcohol, tobacco or illicit drugs  Current medications and supplements including opioid prescriptions. Functional ability and status Nutritional status Physical activity Advanced directives List of  other physicians Hospitalizations, surgeries, and ER visits in previous 12 months Vitals Screenings to include cognitive, depression, and falls Referrals and appointments  Orders Placed This Encounter  Procedures   MM 3D SCREENING MAMMOGRAM BILATERAL BREAST    Standing Status:   Future    Expiration Date:   12/06/2025    Reason for Exam (SYMPTOM  OR DIAGNOSIS REQUIRED):   breast cancer screening    Preferred imaging location?:   [facility]    Is the patient pregnant?:   No   Flu vaccine trivalent PF, 6mos and older(Flulaval,Afluria,Fluarix,Fluzone)   In addition, I have reviewed and discussed with patient certain preventive protocols, quality metrics, and best practice recommendations. A written personalized care plan for preventive services as well as general preventive health recommendations were provided to patient.   Bradlee Bridgers, CMA   12/06/2024   Return ON December 07, 2025 AT 1:10PM, for your yearly Medicare Wellness Visit in person.  After Visit Summary: (MyChart) Due to this being a telephonic visit, the after visit summary with patients personalized plan was offered to patient via MyChart   Appointment(s) made: (December 11, 2024 AT 9:00 AM VIRTUAL WITH GLORIA BACCHUS) 1 YEAR FOLLOW UP MEDICARE WELLNESS December 07, 2025 AT 1:50 PM IN OFFICE.  FLU SHOT ADMINISTERED.

## 2024-12-06 NOTE — Patient Instructions (Addendum)
 Ms. Burgo,  Thank you for taking the time for your Medicare Wellness Visit. I appreciate your continued commitment to your health goals. Please review the care plan we discussed, and feel free to reach out if I can assist you further.  Please note that Annual Wellness Visits do not include a physical exam. Some assessments may be limited, especially if the visit was conducted virtually. If needed, we may recommend an in-person follow-up with your provider.  Ongoing Care Seeing your primary care provider every 3 to 6 months helps us  monitor your health and provide consistent, personalized care.   Next office visit with primary care provider: December 11, 2024 at 9:00 am VIRTUAL  1 year follow up for Medicare well visit: December 07, 2025 AT 1:10 PM  with medicare wellness nurse in office  Referrals If a referral was made during today's visit and you haven't received any updates within two weeks, please contact the referred provider directly to check on the status. A PRESCRIPTION FOR A BLOOD GLUCOSE METER HAS BEEN SENT TO  OUT PATIENT PHARMACY. THEY WILL DELIVER THIS TO YOUR HOME. I'VE SENT A MESSAGE TO THEM ASKING THEM TO TRANSFER YOUR MEDICATIONS FOR PILL PACKAGING AND DELIVERY. CONSIDER PUTTING A DEBIT CARD ON FILE WITH THE PHARMACY SO THAT WHEN IT IS TIME TO REFILL MEDS THEY WILL JUST BILL YOUR CARD FOR YOUR CO-PAY AMOUNT, IF ANY.   AS WE DISCUSSED, PLEASE CHECK YOUR BLOOD SUGAR AT LEAST ONCE A DAY. PLACE YOUR METER BESIDE YOUR BED AS A VISUAL CUE TO CHECK YOUR SUGAR EACH MORNING WHEN YOU WAKE UP. KEEP A LOG. I WILL ATTACH A LOG FOR YOU TO PRINT AND WRITE YOUR BLOOD SUGARS DOWN ON.    Mamm[facility] at [facility] Call [phone] to schedule your screening No perfumes, lotions, or deodorants the day of your screening. You can schedule your mammogram through mychart!   Recommended Screenings:  Health Maintenance  Topic Date Due   Medicare Annual Wellness Visit  Never done    Hepatitis B Vaccine (1 of 3 - 19+ 3-dose series) Never done   Pneumococcal Vaccine for age over 55 (2 of 2 - PCV) 10/30/2015   COVID-19 Vaccine (3 - Moderna risk series) 02/22/2020   Eye exam for diabetics  06/02/2023   Yearly kidney health urinalysis for diabetes  01/14/2024   Breast Cancer Screening  03/14/2024   Flu Shot  07/28/2024   Hemoglobin A1C  12/14/2024   Yearly kidney function blood test for diabetes  06/14/2025   Complete foot exam   12/06/2025   Pap with HPV screening  10/31/2026   Colon Cancer Screening  10/31/2028   DTaP/Tdap/Td vaccine (2 - Td or Tdap) 04/04/2033   Hepatitis C Screening  Completed   HIV Screening  Completed   Zoster (Shingles) Vaccine  Completed   HPV Vaccine  Aged Out   Meningitis B Vaccine  Aged Out       12/06/2024   12:18 PM  Advanced Directives  Does Patient Have a Medical Advance Directive? No  Would patient like information on creating a medical advance directive? Yes (MAU/Ambulatory/Procedural Areas - Information given)    Vision: Annual vision screenings are recommended for early detection of glaucoma, cataracts, and diabetic retinopathy. These exams can also reveal signs of chronic conditions such as diabetes and high blood pressure.  Dental: Annual dental screenings help detect early signs of oral cancer, gum disease, and other conditions linked to overall health, including heart disease and diabetes.  Please see the attached documents for additional preventive care recommendations.

## 2024-12-08 ENCOUNTER — Other Ambulatory Visit (HOSPITAL_COMMUNITY): Payer: Self-pay

## 2024-12-11 ENCOUNTER — Encounter: Payer: Self-pay | Admitting: Family Medicine

## 2024-12-11 ENCOUNTER — Other Ambulatory Visit: Payer: Self-pay

## 2024-12-11 ENCOUNTER — Telehealth: Payer: Medicare (Managed Care) | Admitting: Family Medicine

## 2024-12-11 ENCOUNTER — Other Ambulatory Visit: Payer: Self-pay | Admitting: Medical Genetics

## 2024-12-11 ENCOUNTER — Other Ambulatory Visit (HOSPITAL_COMMUNITY): Payer: Self-pay

## 2024-12-11 VITALS — Ht 64.0 in | Wt 203.0 lb

## 2024-12-11 DIAGNOSIS — R5383 Other fatigue: Secondary | ICD-10-CM

## 2024-12-11 DIAGNOSIS — G2581 Restless legs syndrome: Secondary | ICD-10-CM

## 2024-12-11 DIAGNOSIS — F4001 Agoraphobia with panic disorder: Secondary | ICD-10-CM | POA: Diagnosis not present

## 2024-12-11 DIAGNOSIS — E785 Hyperlipidemia, unspecified: Secondary | ICD-10-CM

## 2024-12-11 DIAGNOSIS — K2900 Acute gastritis without bleeding: Secondary | ICD-10-CM | POA: Diagnosis not present

## 2024-12-11 DIAGNOSIS — E559 Vitamin D deficiency, unspecified: Secondary | ICD-10-CM

## 2024-12-11 DIAGNOSIS — M150 Primary generalized (osteo)arthritis: Secondary | ICD-10-CM | POA: Diagnosis not present

## 2024-12-11 DIAGNOSIS — E119 Type 2 diabetes mellitus without complications: Secondary | ICD-10-CM

## 2024-12-11 DIAGNOSIS — F5104 Psychophysiologic insomnia: Secondary | ICD-10-CM

## 2024-12-11 DIAGNOSIS — E66813 Obesity, class 3: Secondary | ICD-10-CM

## 2024-12-11 DIAGNOSIS — M62838 Other muscle spasm: Secondary | ICD-10-CM

## 2024-12-11 DIAGNOSIS — E038 Other specified hypothyroidism: Secondary | ICD-10-CM

## 2024-12-11 DIAGNOSIS — F411 Generalized anxiety disorder: Secondary | ICD-10-CM

## 2024-12-11 MED ORDER — ROPINIROLE HCL 0.5 MG PO TABS
0.5000 mg | ORAL_TABLET | Freq: Every evening | ORAL | 1 refills | Status: AC | PRN
  Filled 2024-12-11: qty 60, 20d supply, fill #0
  Filled 2025-01-08 – 2025-01-19 (×2): qty 60, 20d supply, fill #1

## 2024-12-11 MED ORDER — VITAMIN D (ERGOCALCIFEROL) 1.25 MG (50000 UNIT) PO CAPS
50000.0000 [IU] | ORAL_CAPSULE | ORAL | 0 refills | Status: AC
  Filled 2024-12-11: qty 12, 84d supply, fill #0

## 2024-12-11 MED ORDER — FERROUS SULFATE 325 (65 FE) MG PO TABS
325.0000 mg | ORAL_TABLET | Freq: Every day | ORAL | 0 refills | Status: DC
  Filled 2024-12-11: qty 30, 30d supply, fill #0

## 2024-12-11 MED ORDER — TIRZEPATIDE 10 MG/0.5ML ~~LOC~~ SOAJ
10.0000 mg | SUBCUTANEOUS | 0 refills | Status: DC
  Filled 2024-12-11: qty 2, 28d supply, fill #0

## 2024-12-11 MED ORDER — QUETIAPINE FUMARATE 25 MG PO TABS
ORAL_TABLET | ORAL | 5 refills | Status: AC
  Filled 2024-12-11: qty 120, 30d supply, fill #0
  Filled 2025-01-08 – 2025-01-19 (×2): qty 120, 30d supply, fill #1

## 2024-12-11 MED ORDER — FAMOTIDINE 20 MG PO TABS
20.0000 mg | ORAL_TABLET | Freq: Two times a day (BID) | ORAL | 0 refills | Status: DC | PRN
  Filled 2024-12-11: qty 30, 15d supply, fill #0

## 2024-12-11 MED ORDER — NAPROXEN 500 MG PO TABS
500.0000 mg | ORAL_TABLET | Freq: Two times a day (BID) | ORAL | 1 refills | Status: AC
  Filled 2024-12-11: qty 30, 15d supply, fill #0
  Filled 2025-01-08 – 2025-01-19 (×2): qty 30, 15d supply, fill #1

## 2024-12-11 MED ORDER — ATORVASTATIN CALCIUM 10 MG PO TABS
10.0000 mg | ORAL_TABLET | Freq: Every day | ORAL | 1 refills | Status: AC
  Filled 2024-12-11: qty 90, 90d supply, fill #0
  Filled 2025-01-08 – 2025-01-10 (×2): qty 90, 90d supply, fill #1

## 2024-12-11 MED ORDER — FOLIC ACID 1 MG PO TABS
1.0000 mg | ORAL_TABLET | Freq: Every day | ORAL | 1 refills | Status: AC
  Filled 2024-12-11: qty 30, 30d supply, fill #0
  Filled 2025-01-08 – 2025-01-19 (×2): qty 30, 30d supply, fill #1

## 2024-12-11 MED ORDER — CYCLOBENZAPRINE HCL 5 MG PO TABS
5.0000 mg | ORAL_TABLET | Freq: Three times a day (TID) | ORAL | 1 refills | Status: AC | PRN
  Filled 2024-12-11: qty 30, 10d supply, fill #0
  Filled 2025-01-08 – 2025-01-19 (×2): qty 30, 10d supply, fill #1

## 2024-12-11 NOTE — Progress Notes (Signed)
 Virtual Visit via Video Note  I connected with Sabrina Roberts on 12/11/2024 at  9:00 AM EST by a video enabled telemedicine application and verified that I am speaking with the correct person using two identifiers.  Patient Location: Home Provider Location: Home Office  I discussed the limitations, risks, security, and privacy concerns of performing an evaluation and management service by video and the availability of in person appointments. I also discussed with the patient that there may be a patient responsible charge related to this service. The patient expressed understanding and agreed to proceed.  Subjective: PCP: Edman Meade PEDLAR, FNP  Chief Complaint  Patient presents with   Medical Management of Chronic Issues    Follow up, med refill    HPI The patient is seen today for medical management of chronic issues. She reports that she has been taking her iron supplement every other day due to side effects of constipation. Today, she is requesting refills and mentions concerns about her weight, noting that it has remained stagnant and she would like to get below 200 lbs. She is asking about switching to another GLP-1 medication for better weight control.   ROS: Per HPI Current Medications[1]  Observations/Objective: Today's Vitals   12/11/24 0856  Weight: 203 lb (92.1 kg)  Height: 5' 4 (1.626 m)   Physical Exam Patient is well-developed, well-nourished in no acute distress.  Resting comfortably at home.  Head is normocephalic, atraumatic.  No labored breathing.  Speech is clear and coherent with logical content.  Patient is alert and oriented at baseline.   Assessment and Plan: Type 2 diabetes mellitus without complication, without long-term current use of insulin (HCC) Assessment & Plan: Will discontinue Trulicity  and start therapy with Mounjaro  10 mg weekly. Encouraged the patient to request refills monthly. Encouraged her to continue all treatment regimens as  prescribed. Recommended implementing lifestyle changes, including a heart-healthy diet and increased physical activity.   Orders: -     Tirzepatide ; Inject 10 mg into the skin once a week.  Dispense: 2 mL; Refill: 0 -     Hemoglobin A1c  Class 3 obesity (HCC) Assessment & Plan: For optimal weight loss results: -Decrease portion sizes. -Reduce sugar, sodium, and carbohydrate intake, and limit saturated fats in your diet. -Increase fiber intake by incorporating more whole grains, fruits, and vegetables. -Set realistic, healthy goals and focus on lowering carbs, sugars, and unhealthy fats. -Increase the variety of fruits and vegetables in your daily meals. -Limit soda and processed foods. -Engage in moderate-intensity physical activity for at least 150 minutes per week (e.g., brisk walking, cycling, or swimming) for the best results.    Fatigue, unspecified type Assessment & Plan: Refills sent to the pharmacy Pending labs  Orders: -     Ferrous Sulfate ; Take 1 tablet (325 mg total) by mouth daily with breakfast.  Dispense: 30 tablet; Refill: 0 -     Folic Acid ; Take 1 tablet (1 mg total) by mouth daily.  Dispense: 30 tablet; Refill: 1 -     B12 and Folate Panel  Hyperlipidemia, unspecified hyperlipidemia type -     Atorvastatin  Calcium ; Take 1 tablet (10 mg total) by mouth daily.  Dispense: 90 tablet; Refill: 1 -     Lipid panel -     CMP14+EGFR -     CBC with Differential/Platelet  Acute gastritis without hemorrhage, unspecified gastritis type -     Famotidine ; Take 1 tablet (20 mg total) by mouth 2 (two) times daily  as needed for heartburn or indigestion.  Dispense: 30 tablet; Refill: 0  GAD (generalized anxiety disorder) -     QUEtiapine  Fumarate; Take 2 pills nightly for insomnia and Take 1 pill twice daily as needed for panic.  Dispense: 120 tablet; Refill: 5  Agoraphobia with panic attacks -     QUEtiapine  Fumarate; Take 2 pills nightly for insomnia and Take 1 pill twice  daily as needed for panic.  Dispense: 120 tablet; Refill: 5  Psychophysiological insomnia -     QUEtiapine  Fumarate; Take 2 pills nightly for insomnia and Take 1 pill twice daily as needed for panic.  Dispense: 120 tablet; Refill: 5  Vitamin D  deficiency -     Vitamin D  (Ergocalciferol ); Take 1 capsule (50,000 Units total) by mouth once a week.  Dispense: 27 capsule; Refill: 0 -     VITAMIN D  25 Hydroxy (Vit-D Deficiency, Fractures)  Primary osteoarthritis involving multiple joints -     Naproxen ; Take 1 tablet (500 mg total) by mouth 2 (two) times daily with a meal.  Dispense: 30 tablet; Refill: 1  RLS (restless legs syndrome) -     rOPINIRole  HCl; Take 1-3 tablets (0.5-1.5 mg total) by mouth at bedtime as needed.  Dispense: 60 tablet; Refill: 1  Muscle spasm -     Cyclobenzaprine  HCl; Take 1 tablet (5 mg total) by mouth 3 (three) times daily as needed.  Dispense: 30 tablet; Refill: 1  TSH (thyroid -stimulating hormone deficiency) -     TSH + free T4   Note: This chart has been completed using [employer] (consulting) software, and while attempts have been made to ensure accuracy, certain words and phrases may not be transcribed as intended.    Follow Up Instructions: No follow-ups on file.   I discussed the assessment and treatment plan with the patient. The patient was provided an opportunity to ask questions, and all were answered. The patient agreed with the plan and demonstrated an understanding of the instructions.   The patient was advised to call back or seek an in-person evaluation if the symptoms worsen or if the condition fails to improve as anticipated.  The above assessment and management plan was discussed with the patient. The patient verbalized understanding of and has agreed to the management plan.   Jamespaul Secrist  Z Bacchus, FNP     [1]  Current Outpatient Medications:    Blood Glucose Monitoring Suppl (ACCU-CHEK GUIDE ME) w/Device KIT, Use to check blood glucose in  the morning, at noon, and at bedtime., Disp: 1 kit, Rfl: 0   diazepam  (VALIUM ) 5 MG tablet, Take 1 tablet (5 mg total) by mouth every 12 (twelve) hours as needed for anxiety., Disp: 5 tablet, Rfl: 0   DULoxetine  (CYMBALTA ) 60 MG capsule, Take 2 capsules (120 mg total) by mouth daily., Disp: 60 capsule, Rfl: 5   Glucose Blood (BLOOD GLUCOSE TEST STRIPS) STRP, Use to check blood glucose in the morning, at noon, and at bedtime., Disp: 100 strip, Rfl: 0   hydrOXYzine  (VISTARIL ) 25 MG capsule, TAKE 1 CAPSULE BY MOUTH AT BEDTIME AS NEEDED, Disp: 30 capsule, Rfl: 0   IBSRELA 50 MG TABS, Take 50 mg by mouth in the morning and at bedtime., Disp: , Rfl:    Lancet Device MISC, 1 each by Does not apply route in the morning, at noon, and at bedtime. May substitute to any manufacturer covered by patient's insurance., Disp: 1 each, Rfl: 0   Lancets MISC, Use up to four times  daily as directed., Disp: 100 each, Rfl: 0   Multiple Vitamin (MULTIVITAMIN) tablet, Take 1 tablet by mouth daily. , Disp: , Rfl:    naproxen  (NAPROSYN ) 500 MG tablet, Take 1 tablet (500 mg total) by mouth 2 (two) times daily with a meal., Disp: 30 tablet, Rfl: 1   tirzepatide  (MOUNJARO ) 10 MG/0.5ML Pen, Inject 10 mg into the skin once a week., Disp: 2 mL, Rfl: 0   zolpidem  (AMBIEN ) 10 MG tablet, Take 10 mg by mouth at bedtime., Disp: , Rfl:    atorvastatin  (LIPITOR) 10 MG tablet, Take 1 tablet (10 mg total) by mouth daily., Disp: 90 tablet, Rfl: 1   buprenorphine (BUTRANS) 5 MCG/HR PTWK, Place 1 patch onto the skin once a week. (Patient not taking: Reported on 12/11/2024), Disp: , Rfl:    cyclobenzaprine  (FLEXERIL ) 5 MG tablet, Take 1 tablet (5 mg total) by mouth 3 (three) times daily as needed., Disp: 30 tablet, Rfl: 1   famotidine  (PEPCID ) 20 MG tablet, Take 1 tablet (20 mg total) by mouth 2 (two) times daily as needed for heartburn or indigestion., Disp: 30 tablet, Rfl: 0   ferrous sulfate  (FEROSUL) 325 (65 FE) MG tablet, Take 1 tablet (325  mg total) by mouth daily with breakfast., Disp: 30 tablet, Rfl: 0   folic acid  (FOLVITE ) 1 MG tablet, Take 1 tablet (1 mg total) by mouth daily., Disp: 30 tablet, Rfl: 1   linaclotide  (LINZESS ) 72 MCG capsule, Take 1 capsule (72 mcg total) by mouth daily before breakfast. (Patient not taking: Reported on 12/11/2024), Disp: , Rfl:    LYRICA  150 MG capsule, , Disp: , Rfl:    QUEtiapine  (SEROQUEL ) 25 MG tablet, Take 2 pills nightly for insomnia and Take 1 pill twice daily as needed for panic., Disp: 120 tablet, Rfl: 5   rOPINIRole  (REQUIP ) 0.5 MG tablet, Take 1-3 tablets (0.5-1.5 mg total) by mouth at bedtime as needed., Disp: 60 tablet, Rfl: 1   Vitamin D , Ergocalciferol , (DRISDOL ) 1.25 MG (50000 UNIT) CAPS capsule, Take 1 capsule (50,000 Units total) by mouth once a week., Disp: 27 capsule, Rfl: 0

## 2024-12-11 NOTE — Assessment & Plan Note (Signed)
 For optimal weight loss results: -Decrease portion sizes. -Reduce sugar, sodium, and carbohydrate intake, and limit saturated fats in your diet. -Increase fiber intake by incorporating more whole grains, fruits, and vegetables. -Set realistic, healthy goals and focus on lowering carbs, sugars, and unhealthy fats. -Increase the variety of fruits and vegetables in your daily meals. -Limit soda and processed foods. -Engage in moderate-intensity physical activity for at least 150 minutes per week (e.g., brisk walking, cycling, or swimming) for the best results.

## 2024-12-11 NOTE — Assessment & Plan Note (Signed)
 Refills sent to the pharmacy Pending labs

## 2024-12-11 NOTE — Assessment & Plan Note (Signed)
 Will discontinue Trulicity  and start therapy with Mounjaro  10 mg weekly. Encouraged the patient to request refills monthly. Encouraged her to continue all treatment regimens as prescribed. Recommended implementing lifestyle changes, including a heart-healthy diet and increased physical activity.

## 2024-12-12 ENCOUNTER — Other Ambulatory Visit (HOSPITAL_COMMUNITY): Payer: Self-pay

## 2024-12-13 ENCOUNTER — Encounter (HOSPITAL_COMMUNITY): Payer: Self-pay

## 2024-12-13 ENCOUNTER — Ambulatory Visit (HOSPITAL_COMMUNITY)
Admission: RE | Admit: 2024-12-13 | Discharge: 2024-12-13 | Disposition: A | Discharge status: Home / Self Care | Payer: Self-pay | Source: Ambulatory Visit

## 2024-12-13 ENCOUNTER — Inpatient Hospital Stay (HOSPITAL_COMMUNITY): Admission: RE | Admit: 2024-12-13 | Discharge: 2024-12-13 | Payer: Medicare (Managed Care)

## 2024-12-13 DIAGNOSIS — Z1231 Encounter for screening mammogram for malignant neoplasm of breast: Secondary | ICD-10-CM | POA: Insufficient documentation

## 2024-12-22 ENCOUNTER — Other Ambulatory Visit: Payer: Self-pay | Admitting: Gastroenterology

## 2024-12-26 LAB — GENECONNECT MOLECULAR SCREEN: Genetic Analysis Overall Interpretation: NEGATIVE

## 2025-01-08 ENCOUNTER — Other Ambulatory Visit: Payer: Self-pay | Admitting: Family Medicine

## 2025-01-08 ENCOUNTER — Other Ambulatory Visit (HOSPITAL_COMMUNITY): Payer: Self-pay

## 2025-01-08 ENCOUNTER — Other Ambulatory Visit: Payer: Self-pay

## 2025-01-08 DIAGNOSIS — E119 Type 2 diabetes mellitus without complications: Secondary | ICD-10-CM

## 2025-01-08 DIAGNOSIS — K2900 Acute gastritis without bleeding: Secondary | ICD-10-CM

## 2025-01-08 DIAGNOSIS — R5383 Other fatigue: Secondary | ICD-10-CM

## 2025-01-08 MED ORDER — BLOOD GLUCOSE TEST VI STRP
1.0000 | ORAL_STRIP | Freq: Three times a day (TID) | 0 refills | Status: AC
  Filled 2025-01-08 – 2025-01-19 (×2): qty 100, 34d supply, fill #0

## 2025-01-08 MED ORDER — TIRZEPATIDE 12.5 MG/0.5ML ~~LOC~~ SOAJ
12.5000 mg | SUBCUTANEOUS | 0 refills | Status: AC
  Filled 2025-01-08 – 2025-01-19 (×2): qty 2, 28d supply, fill #0

## 2025-01-08 MED ORDER — FAMOTIDINE 20 MG PO TABS
20.0000 mg | ORAL_TABLET | Freq: Two times a day (BID) | ORAL | 0 refills | Status: AC | PRN
  Filled 2025-01-08 – 2025-01-19 (×2): qty 30, 15d supply, fill #0

## 2025-01-08 MED ORDER — FERROUS SULFATE 325 (65 FE) MG PO TABS
325.0000 mg | ORAL_TABLET | Freq: Every day | ORAL | 0 refills | Status: AC
  Filled 2025-01-08 – 2025-01-19 (×2): qty 30, 30d supply, fill #0

## 2025-01-10 ENCOUNTER — Other Ambulatory Visit: Payer: Self-pay

## 2025-01-10 ENCOUNTER — Other Ambulatory Visit (HOSPITAL_COMMUNITY): Payer: Self-pay

## 2025-01-11 ENCOUNTER — Other Ambulatory Visit (HOSPITAL_COMMUNITY): Payer: Self-pay

## 2025-01-13 ENCOUNTER — Ambulatory Visit: Payer: Self-pay

## 2025-01-18 ENCOUNTER — Other Ambulatory Visit: Payer: Self-pay | Admitting: Gastroenterology

## 2025-01-19 ENCOUNTER — Other Ambulatory Visit (HOSPITAL_COMMUNITY): Payer: Self-pay

## 2025-01-21 ENCOUNTER — Other Ambulatory Visit (HOSPITAL_COMMUNITY): Payer: Self-pay

## 2025-01-22 ENCOUNTER — Other Ambulatory Visit (HOSPITAL_COMMUNITY): Payer: Self-pay

## 2025-01-23 ENCOUNTER — Other Ambulatory Visit: Payer: Self-pay

## 2025-05-23 ENCOUNTER — Ambulatory Visit: Payer: Self-pay | Admitting: Nurse Practitioner

## 2025-12-07 ENCOUNTER — Ambulatory Visit: Payer: Medicare (Managed Care)
